# Patient Record
Sex: Female | Born: 1965
Health system: Southern US, Community
[De-identification: ages and names within clinical notes are randomized; demographics above are authoritative.]

## PROBLEM LIST (undated history)

## (undated) DIAGNOSIS — D509 Iron deficiency anemia, unspecified: Secondary | ICD-10-CM

## (undated) DIAGNOSIS — M2550 Pain in unspecified joint: Secondary | ICD-10-CM

## (undated) DIAGNOSIS — F329 Major depressive disorder, single episode, unspecified: Secondary | ICD-10-CM

## (undated) DIAGNOSIS — T7840XA Allergy, unspecified, initial encounter: Secondary | ICD-10-CM

## (undated) DIAGNOSIS — E559 Vitamin D deficiency, unspecified: Secondary | ICD-10-CM

## (undated) DIAGNOSIS — IMO0002 Reserved for concepts with insufficient information to code with codable children: Secondary | ICD-10-CM

## (undated) DIAGNOSIS — R011 Cardiac murmur, unspecified: Secondary | ICD-10-CM

## (undated) DIAGNOSIS — H269 Unspecified cataract: Secondary | ICD-10-CM

## (undated) DIAGNOSIS — G709 Myoneural disorder, unspecified: Secondary | ICD-10-CM

## (undated) DIAGNOSIS — K5900 Constipation, unspecified: Secondary | ICD-10-CM

## (undated) DIAGNOSIS — E079 Disorder of thyroid, unspecified: Secondary | ICD-10-CM

## (undated) DIAGNOSIS — F419 Anxiety disorder, unspecified: Secondary | ICD-10-CM

## (undated) DIAGNOSIS — F32A Depression, unspecified: Secondary | ICD-10-CM

## (undated) DIAGNOSIS — K219 Gastro-esophageal reflux disease without esophagitis: Secondary | ICD-10-CM

## (undated) DIAGNOSIS — E669 Obesity, unspecified: Secondary | ICD-10-CM

## (undated) DIAGNOSIS — D649 Anemia, unspecified: Secondary | ICD-10-CM

## (undated) DIAGNOSIS — K909 Intestinal malabsorption, unspecified: Secondary | ICD-10-CM

## (undated) DIAGNOSIS — E039 Hypothyroidism, unspecified: Secondary | ICD-10-CM

## (undated) DIAGNOSIS — Z86718 Personal history of other venous thrombosis and embolism: Secondary | ICD-10-CM

## (undated) DIAGNOSIS — H409 Unspecified glaucoma: Secondary | ICD-10-CM

## (undated) DIAGNOSIS — M549 Dorsalgia, unspecified: Secondary | ICD-10-CM

## (undated) DIAGNOSIS — M1990 Unspecified osteoarthritis, unspecified site: Secondary | ICD-10-CM

## (undated) DIAGNOSIS — R609 Edema, unspecified: Secondary | ICD-10-CM

## (undated) HISTORY — DX: Edema, unspecified: R60.9

## (undated) HISTORY — DX: Iron deficiency anemia, unspecified: D50.9

## (undated) HISTORY — PX: BACK SURGERY: SHX140

## (undated) HISTORY — DX: Reserved for concepts with insufficient information to code with codable children: IMO0002

## (undated) HISTORY — DX: Cardiac murmur, unspecified: R01.1

## (undated) HISTORY — DX: Vitamin D deficiency, unspecified: E55.9

## (undated) HISTORY — DX: Dorsalgia, unspecified: M54.9

## (undated) HISTORY — DX: Allergy, unspecified, initial encounter: T78.40XA

## (undated) HISTORY — PX: JOINT REPLACEMENT: SHX530

## (undated) HISTORY — DX: Personal history of other venous thrombosis and embolism: Z86.718

## (undated) HISTORY — DX: Anemia, unspecified: D64.9

## (undated) HISTORY — DX: Myoneural disorder, unspecified: G70.9

## (undated) HISTORY — DX: Gastro-esophageal reflux disease without esophagitis: K21.9

## (undated) HISTORY — DX: Unspecified osteoarthritis, unspecified site: M19.90

## (undated) HISTORY — DX: Unspecified glaucoma: H40.9

## (undated) HISTORY — DX: Intestinal malabsorption, unspecified: K90.9

## (undated) HISTORY — PX: SMALL INTESTINE SURGERY: SHX150

## (undated) HISTORY — PX: COLON SURGERY: SHX602

## (undated) HISTORY — DX: Pain in unspecified joint: M25.50

## (undated) HISTORY — DX: Unspecified cataract: H26.9

## (undated) HISTORY — DX: Obesity, unspecified: E66.9

## (undated) HISTORY — DX: Disorder of thyroid, unspecified: E07.9

## (undated) HISTORY — DX: Hypothyroidism, unspecified: E03.9

## (undated) HISTORY — DX: Constipation, unspecified: K59.00

## (undated) HISTORY — PX: SPINE SURGERY: SHX786

---

## 1997-02-10 HISTORY — PX: ABDOMINAL HYSTERECTOMY: SHX81

## 1998-02-10 HISTORY — PX: KNEE SURGERY: SHX244

## 1998-09-05 ENCOUNTER — Ambulatory Visit (HOSPITAL_COMMUNITY): Admission: RE | Admit: 1998-09-05 | Discharge: 1998-09-05 | Payer: Self-pay

## 1999-03-19 ENCOUNTER — Other Ambulatory Visit: Admission: RE | Admit: 1999-03-19 | Discharge: 1999-03-19 | Payer: Self-pay | Admitting: Family Medicine

## 1999-03-22 ENCOUNTER — Encounter: Payer: Self-pay | Admitting: Family Medicine

## 1999-03-22 ENCOUNTER — Ambulatory Visit (HOSPITAL_COMMUNITY): Admission: RE | Admit: 1999-03-22 | Discharge: 1999-03-22 | Payer: Self-pay | Admitting: Family Medicine

## 1999-04-01 ENCOUNTER — Ambulatory Visit (HOSPITAL_COMMUNITY): Admission: RE | Admit: 1999-04-01 | Discharge: 1999-04-01 | Payer: Self-pay | Admitting: Family Medicine

## 1999-04-02 ENCOUNTER — Encounter: Payer: Self-pay | Admitting: Family Medicine

## 2000-05-01 ENCOUNTER — Encounter: Payer: Self-pay | Admitting: Internal Medicine

## 2000-05-01 ENCOUNTER — Ambulatory Visit (HOSPITAL_COMMUNITY): Admission: RE | Admit: 2000-05-01 | Discharge: 2000-05-01 | Payer: Self-pay | Admitting: Internal Medicine

## 2000-12-15 ENCOUNTER — Other Ambulatory Visit: Admission: RE | Admit: 2000-12-15 | Discharge: 2000-12-15 | Payer: Self-pay | Admitting: Family Medicine

## 2004-10-22 ENCOUNTER — Other Ambulatory Visit: Admission: RE | Admit: 2004-10-22 | Discharge: 2004-10-22 | Payer: Self-pay | Admitting: Family Medicine

## 2004-11-06 ENCOUNTER — Ambulatory Visit (HOSPITAL_COMMUNITY): Admission: RE | Admit: 2004-11-06 | Discharge: 2004-11-06 | Payer: Self-pay | Admitting: Obstetrics and Gynecology

## 2005-02-10 HISTORY — PX: GASTRIC BYPASS: SHX52

## 2005-05-21 ENCOUNTER — Encounter: Admission: RE | Admit: 2005-05-21 | Discharge: 2005-05-21 | Payer: Self-pay | Admitting: Surgery

## 2005-05-21 ENCOUNTER — Ambulatory Visit (HOSPITAL_COMMUNITY): Admission: RE | Admit: 2005-05-21 | Discharge: 2005-05-21 | Payer: Self-pay | Admitting: Surgery

## 2005-05-23 ENCOUNTER — Ambulatory Visit (HOSPITAL_COMMUNITY): Admission: RE | Admit: 2005-05-23 | Discharge: 2005-05-23 | Payer: Self-pay | Admitting: Surgery

## 2005-06-08 ENCOUNTER — Ambulatory Visit (HOSPITAL_BASED_OUTPATIENT_CLINIC_OR_DEPARTMENT_OTHER): Admission: RE | Admit: 2005-06-08 | Discharge: 2005-06-08 | Payer: Self-pay | Admitting: Surgery

## 2005-06-15 ENCOUNTER — Ambulatory Visit: Payer: Self-pay | Admitting: Internal Medicine

## 2005-06-17 ENCOUNTER — Encounter: Admission: RE | Admit: 2005-06-17 | Discharge: 2005-06-17 | Payer: Self-pay | Admitting: Surgery

## 2005-08-25 ENCOUNTER — Encounter: Admission: RE | Admit: 2005-08-25 | Discharge: 2005-10-23 | Payer: Self-pay | Admitting: Surgery

## 2005-09-08 ENCOUNTER — Inpatient Hospital Stay (HOSPITAL_COMMUNITY): Admission: RE | Admit: 2005-09-08 | Discharge: 2005-09-10 | Payer: Self-pay | Admitting: Surgery

## 2005-09-09 ENCOUNTER — Encounter: Payer: Self-pay | Admitting: Vascular Surgery

## 2006-03-27 ENCOUNTER — Encounter: Admission: RE | Admit: 2006-03-27 | Discharge: 2006-06-25 | Payer: Self-pay | Admitting: Surgery

## 2006-06-30 ENCOUNTER — Encounter: Admission: RE | Admit: 2006-06-30 | Discharge: 2006-06-30 | Payer: Self-pay | Admitting: Surgery

## 2006-10-13 IMAGING — MG MM DIAGNOSTIC LTD LEFT
2 series · 2 of 2 positions shown · non-contrast
Comparison: none

[REDACTED] LEFT
CC and MLO view(s) were taken of the left breast.

DIGITAL LIMITED LEFT DIAGNOSTIC MAMMOGRAM:
CLINICAL DATA: The patient returns for evaluation of a possible mass in the left breast noted on 
recent screening study of 05-23-05.
A spot compression view of the central portion of the left breast was obtained in the MLO 
projection with no persistent mass or distortion.  A true mediolateral view was obtained and no 
abnormality is noted.

[L MLO]
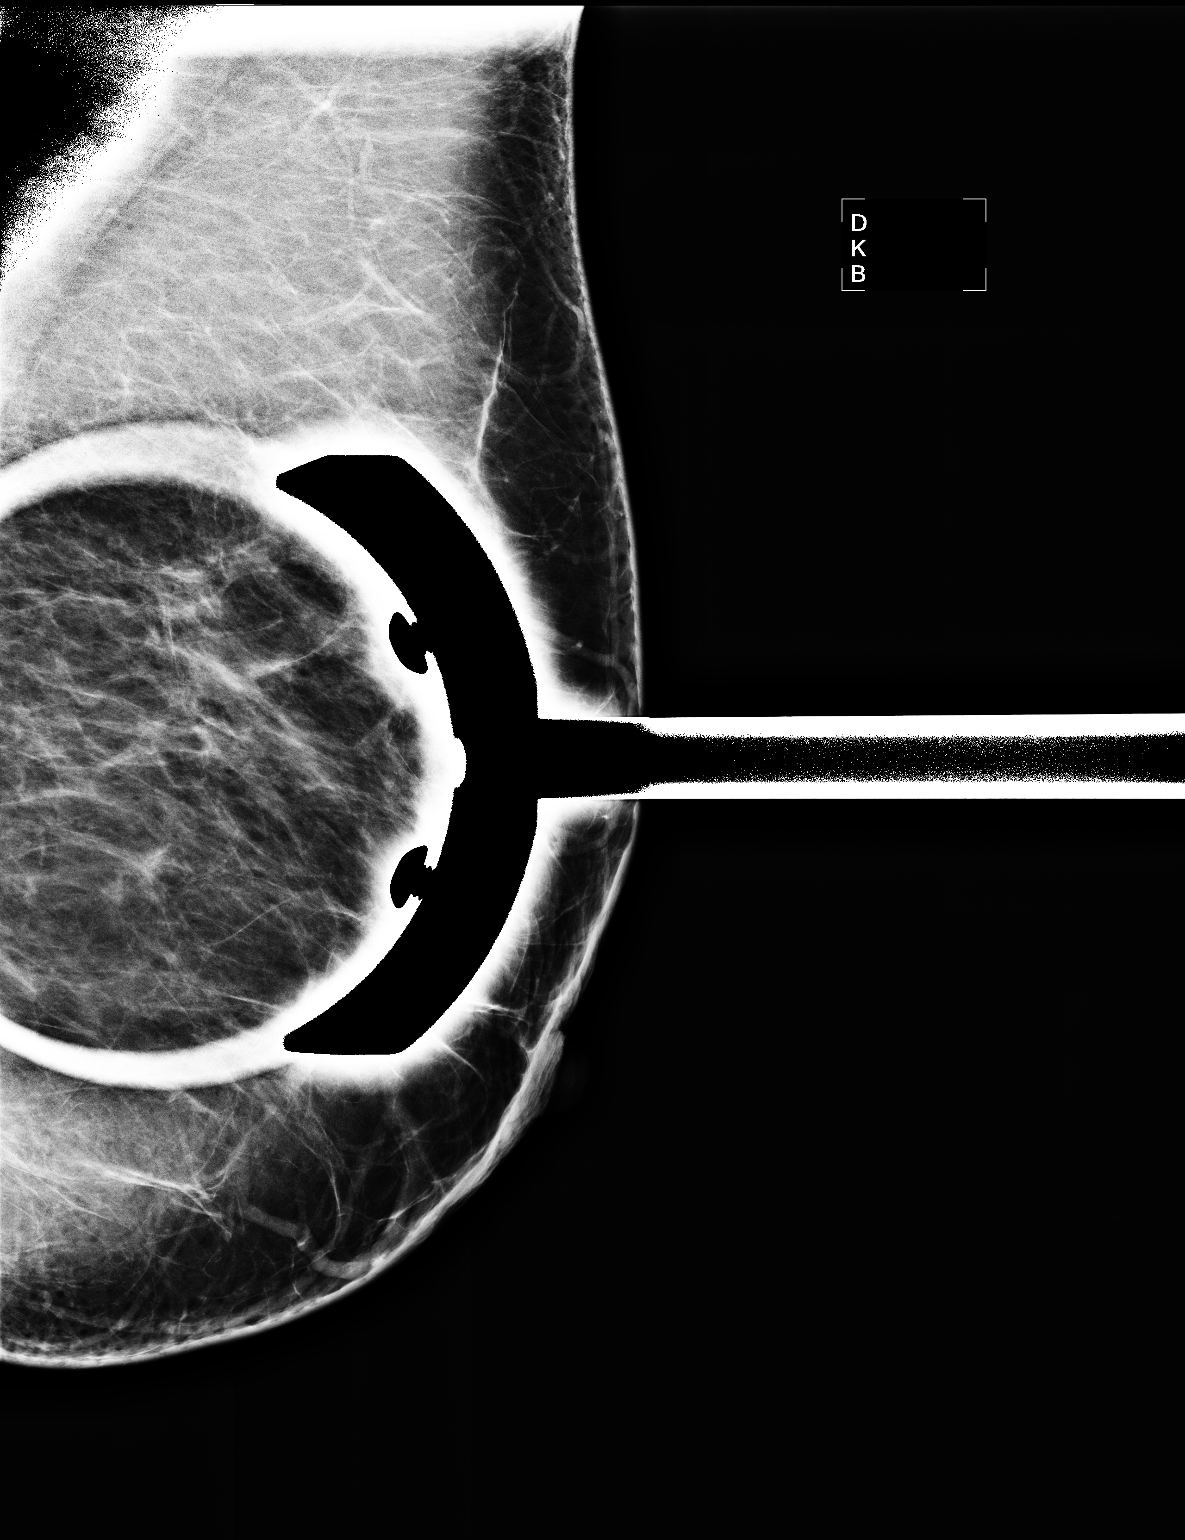

[L ML]
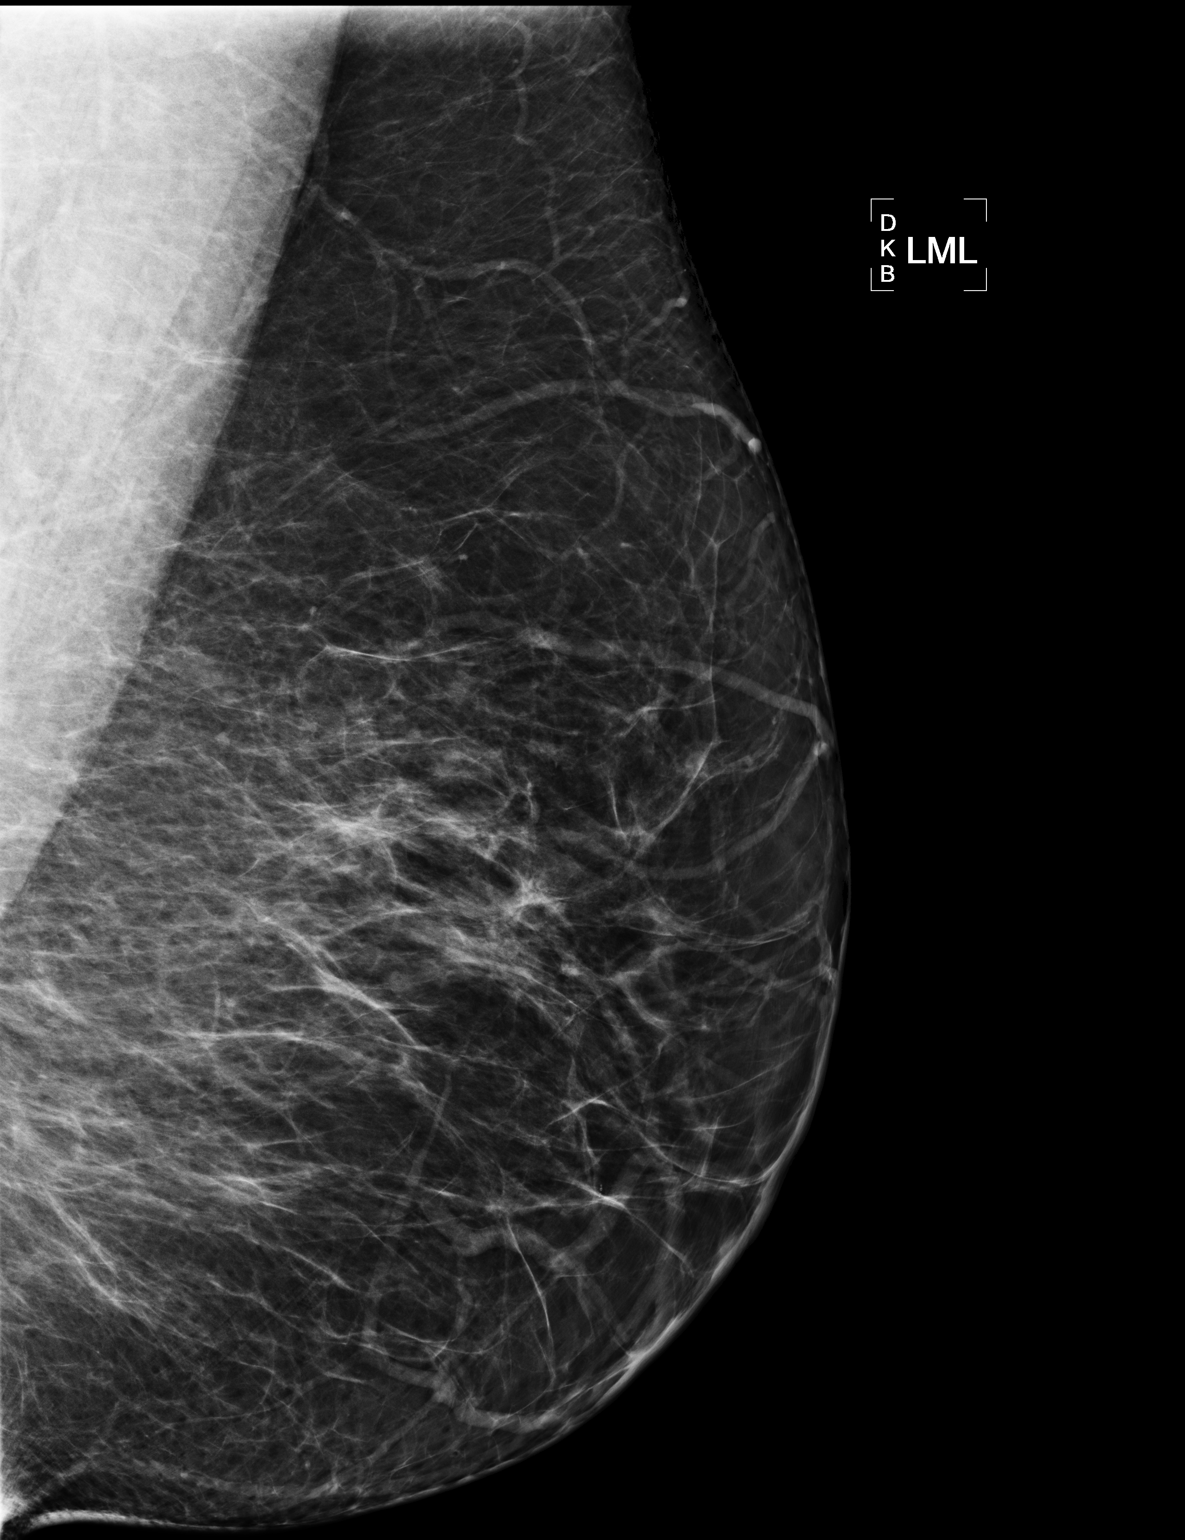

[2 of 2 positions shown; findings below may reference images not displayed]

IMPRESSION: No persistent worrisome abnormality upon additional imaging of the left breast.  Yearly screening 
mammography is suggested.

ASSESSMENT: Negative - BI-RADS 1

Screening mammogram of both breasts in 1 year.
, THIS PROCEDURE WAS A DIGITAL MAMMOGRAM.

## 2006-12-30 ENCOUNTER — Other Ambulatory Visit: Admission: RE | Admit: 2006-12-30 | Discharge: 2006-12-30 | Payer: Self-pay | Admitting: Family Medicine

## 2007-01-14 ENCOUNTER — Ambulatory Visit (HOSPITAL_COMMUNITY): Admission: RE | Admit: 2007-01-14 | Discharge: 2007-01-14 | Payer: Self-pay | Admitting: Family Medicine

## 2008-07-14 ENCOUNTER — Encounter: Admission: RE | Admit: 2008-07-14 | Discharge: 2008-07-14 | Payer: Self-pay | Admitting: Surgery

## 2009-10-18 ENCOUNTER — Encounter
Admission: RE | Admit: 2009-10-18 | Discharge: 2009-12-28 | Payer: Self-pay | Source: Home / Self Care | Admitting: Physical Medicine & Rehabilitation

## 2009-10-23 ENCOUNTER — Ambulatory Visit: Payer: Self-pay | Admitting: Physical Medicine & Rehabilitation

## 2009-11-09 ENCOUNTER — Ambulatory Visit: Payer: Self-pay | Admitting: Physical Medicine & Rehabilitation

## 2009-12-10 ENCOUNTER — Ambulatory Visit: Payer: Self-pay | Admitting: Physical Medicine & Rehabilitation

## 2009-12-28 ENCOUNTER — Ambulatory Visit: Payer: Self-pay | Admitting: Physical Medicine & Rehabilitation

## 2010-02-15 ENCOUNTER — Ambulatory Visit (HOSPITAL_COMMUNITY)
Admission: RE | Admit: 2010-02-15 | Discharge: 2010-02-15 | Payer: Self-pay | Source: Home / Self Care | Attending: Family Medicine | Admitting: Family Medicine

## 2010-02-28 ENCOUNTER — Encounter
Admission: RE | Admit: 2010-02-28 | Discharge: 2010-02-28 | Payer: Self-pay | Source: Home / Self Care | Attending: Family Medicine | Admitting: Family Medicine

## 2010-03-03 ENCOUNTER — Encounter: Payer: Self-pay | Admitting: Surgery

## 2010-06-28 NOTE — Op Note (Signed)
NAMEMarland Guerrero  RIANE, RUNG NO.:  [id number]   MEDICAL RECORD NO.:  [id number]          PATIENT TYPE:  INP   LOCATION:  1508                         FACILITY:  [facility]   PHYSICIAN:  Thornton Park. [name], MD  DATE OF BIRTH:  01-30-1966   DATE OF PROCEDURE:  09/08/2005  DATE OF DISCHARGE:                                 OPERATIVE REPORT   PREOPERATIVE DIAGNOSIS:  Morbid obesity.   POSTOPERATIVE DIAGNOSIS:  Morbid obesity.   PROCEDURE:  Laparoscopic Roux-en-Y gastric bypass.   SURGEON:  Thornton Park. [name], MD.   ASSISTANT:  Alfonse Ras, MD.   ANESTHESIA:  General endotracheal.   DESCRIPTION OF PROCEDURE:  The patient was taken to room one, given general  anesthesia.  The abdomen was prepped with chlorhexidine and draped  sterilely.  Access to the abdomen was gained through the left upper quadrant  using the Optiview technique entering the abdomen without difficulty.  The  abdomen was insufflated and the usual combination of trocars was used  including a 5 mm in the upper midline later for the [place], two 11's in  the right upper quadrant, and an 11 to the left of the umbilicus. I had to  first take down a bunch of adhesions that to the lower previous hysterectomy  incision.  These were taken down with the harmonic scalpel.   Then we identified the ligament Treitz and measured 40 cm distal to the  ligament of Treitz where we divided it with a 4.5 cm Ethicon stapler using a  white load.  The distal end had a Penrose drain which would later be the  Roux limb.  I sutured this Penrose drain on the Roux limb end and then  measured 1 meter down on the Roux limb and then placed the Roux limb and the  biliopancreatic limb side-to-side and created a common defect and stitched  them together and then fired it with a single application of the echelon  stapler white load.  I closed the common defect with a running 2-0 Vicryl  and oversewed that layer with a second layer of  2-0 Vicryl and then covered  that with Tisseel.  The common defect was closed with running 2-0 silk.   The Roux limb lay nicely and was left.  Next we went up and put the  [facility] retractor in and fashioned a small pouch.  I measured down about 4  cm and then came in from the lesser curvature with the first firing of the  echelon stapler.  After firing this using a gold load, the stapler seemed to  hang and I had to manipulate it several times just to get it to disengage.  It seemed to have placed the usual 6 rows of staples nicely and cut.  I  inspected that.  Then I made several more applications to create a small  pouch using Ewald tube. I then sewed the Roux-en-Y limb up to the back wall  with a running 2-0 Vicryl.  I made common defects on the gastric pouch and  the Roux limb side and introduced the Ethicon 45 blue load.  I fired this  and it seemed to fire a nice posterior anastomosis.  This common defect was  closed with running 2-0 Vicryl and was oversewn with a 2-0 Vicryl free  stitch with laparoties.   Dr. Colin Benton went up and endoscoped the patient and I put a clamp on the  outflow tract as was we got a good pressure testing of the pouch and  anastomosis under saline.  No air bubbles were seen, no evidence of leak.  The pouch looked good from the inside.  I went ahead and removed the excess  saline and put Tisseel on the anastomosis.  I also closed the Rentz  defect with a running 2-0 silk suture tacking colon mesentery to the Roux  limb and also up to the gastric remnant.   We inspected everything, it looked good. The abdomen was then deflated.  The  wounds were closed with 4-0 Vicryl and then with staples.  The patient  seemed to tolerate the procedure well.      Thornton Park [name], MD  Electronically Signed     MBM/MEDQ  D:  09/08/2005  T:  09/09/2005  Job:  [id number]

## 2010-06-28 NOTE — Procedures (Signed)
NAME:  Natalie Guerrero, Natalie Guerrero NO.:  [id number]   MEDICAL RECORD NO.:  [id number]          PATIENT TYPE:  OUT   LOCATION:  SLEEP CENTER                 FACILITY:  [facility]   PHYSICIAN:  Clinton D. Maple Hudson, M.D. DATE OF BIRTH:  [birth year]   DATE OF STUDY:  06/08/2005                              NOCTURNAL POLYSOMNOGRAM   REFERRING PHYSICIAN:  Dr. Luretha Murphy.   INDICATIONS FOR STUDY:  Hypersomnia with sleep apnea.   EPWORTH SLEEPINESS SCORE:  09/24.   BMI:  1.  Weight 320 pounds.   HOME MEDICATIONS:  Xanax, Levothyroxine.   SLEEP ARCHITECTURE:  Total sleep time 373 minutes with sleep efficiency 90%.  Stage I was 6%, stage II 67%, stages III and IV 7%, REM 20% of total sleep  time.  Sleep latency 3 minutes, REM latency 89 minutes, awake after sleep  onset 40 minutes, arousal index 13.7.  No bedtime medication taken.   RESPIRATORY DATA:  Apnea/hypopnea index (AHI, RDI) 10 obstructive events per  hour indicating mild obstructive sleep apnea/hypopnea syndrome.  This  included 15 obstructive apneas and 47 hypopneas.  Events were more common  while supine but also noted while sleeping on her left side.  REM AHI of  17.1 per hour.  She had insignificant early events to permit use of CPAP  titration by split protocol on the study night.   OXYGEN DATA:  Mild to moderate snoring with oxygen desaturation to a nadir  of 83%.  Mean oxygen saturation through the study was 94% on room air.   CARDIAC DATA:  Normal sinus rhythm.   MOVEMENT/PARASOMNIAS:  Occasional limb jerk with arousal, insignificant.  Bathroom x2.   IMPRESSION/RECOMMENDATIONS:  1.  Mild obstructive sleep apnea/hypopnea syndrome, AHI 10 per hour with      events somewhat more common while supine.  Mild to moderate snoring with      oxygen desaturation to a nadir of 83%.  2.  Scores in this range are sometimes treated with CPAP if more      conservative therapies including weight loss, sleeping off flat of  back,      and treatment for any significant nasal airway obstruction prove      insufficient.  CPAP      therapy may be considered if indicated by observation in the immediate      postoperative period, but may not be required.  CPAP titration can be      performed if clinically appropriate.      Clinton D. Maple Hudson, M.D.  Diplomate, Biomedical engineer of Sleep Medicine  Electronically Signed     CDY/MEDQ  D:  06/15/2005 10:36:16  T:  06/16/2005 11:58:47  Job:  [id number]

## 2010-06-28 NOTE — Op Note (Signed)
NAMEMarland Kitchen  Natalie Guerrero, Natalie Guerrero NO.:  [id number]   MEDICAL RECORD NO.:  [id number]          PATIENT TYPE:  INP   LOCATION:  1508                         FACILITY:  [facility]   PHYSICIAN:  Alfonse Ras, MD   DATE OF BIRTH:  September 12, 1965   DATE OF PROCEDURE:  09/08/2005  DATE OF DISCHARGE:                                 OPERATIVE REPORT   PREOPERATIVE DIAGNOSIS:  Status post laparoscopic Roux-en-Y gastric bypass.   POSTOPERATIVE DIAGNOSIS:  Status post laparoscopic Roux-en-Y gastric bypass,  no evidence of leak and patent anastomosis   PROCEDURE:  Upper endoscopy.   SURGEON:  Dr. Baruch Merl.   DESCRIPTION:  At the completion of laparoscopic Roux-en-Y gastric bypass,  the Olympus endoscope was passed transorally, down through the esophagus  under direct vision.  The gastric pouch was entered and was insufflated.  There was no evidence of leak and the anastomosis was patent.  It was then  measured at 5 cm in length, and the scope was removed.  The remainder of the  operative report will be dictated by Dr. [name].      Alfonse Ras, MD  Electronically Signed     KRE/MEDQ  D:  09/08/2005  T:  09/09/2005  Job:  [id number]   cc:   Thornton Park [name], MD  [address]  [place]  [place] [address]

## 2010-07-14 ENCOUNTER — Other Ambulatory Visit: Payer: Self-pay | Admitting: Orthopedic Surgery

## 2010-07-14 DIAGNOSIS — M549 Dorsalgia, unspecified: Secondary | ICD-10-CM

## 2010-07-14 DIAGNOSIS — IMO0002 Reserved for concepts with insufficient information to code with codable children: Secondary | ICD-10-CM

## 2010-07-17 ENCOUNTER — Other Ambulatory Visit: Payer: Self-pay | Admitting: Orthopedic Surgery

## 2010-07-17 ENCOUNTER — Ambulatory Visit
Admission: RE | Admit: 2010-07-17 | Discharge: 2010-07-17 | Disposition: A | Discharge status: Home / Self Care | Payer: Self-pay | Source: Ambulatory Visit | Attending: Orthopedic Surgery | Admitting: Orthopedic Surgery

## 2010-07-17 DIAGNOSIS — M549 Dorsalgia, unspecified: Secondary | ICD-10-CM

## 2010-07-17 DIAGNOSIS — IMO0002 Reserved for concepts with insufficient information to code with codable children: Secondary | ICD-10-CM

## 2010-07-31 ENCOUNTER — Other Ambulatory Visit: Payer: Self-pay | Admitting: Family Medicine

## 2010-07-31 ENCOUNTER — Other Ambulatory Visit (HOSPITAL_COMMUNITY)
Admission: RE | Admit: 2010-07-31 | Discharge: 2010-07-31 | Disposition: A | Discharge status: Home / Self Care | Payer: 59 | Source: Ambulatory Visit | Attending: Family Medicine | Admitting: Family Medicine

## 2010-07-31 DIAGNOSIS — Z1159 Encounter for screening for other viral diseases: Secondary | ICD-10-CM | POA: Insufficient documentation

## 2010-07-31 DIAGNOSIS — Z01419 Encounter for gynecological examination (general) (routine) without abnormal findings: Secondary | ICD-10-CM | POA: Insufficient documentation

## 2010-09-11 ENCOUNTER — Encounter (HOSPITAL_COMMUNITY)
Admission: RE | Admit: 2010-09-11 | Discharge: 2010-09-11 | Disposition: A | Discharge status: Home / Self Care | Payer: Worker's Compensation | Source: Ambulatory Visit | Attending: Orthopedic Surgery | Admitting: Orthopedic Surgery

## 2010-09-11 ENCOUNTER — Other Ambulatory Visit (HOSPITAL_COMMUNITY): Payer: Self-pay | Admitting: Orthopedic Surgery

## 2010-09-11 ENCOUNTER — Ambulatory Visit (HOSPITAL_COMMUNITY)
Admission: RE | Admit: 2010-09-11 | Discharge: 2010-09-11 | Disposition: A | Discharge status: Home / Self Care | Payer: Worker's Compensation | Source: Ambulatory Visit | Attending: Orthopedic Surgery | Admitting: Orthopedic Surgery

## 2010-09-11 DIAGNOSIS — M545 Low back pain, unspecified: Secondary | ICD-10-CM

## 2010-09-11 DIAGNOSIS — Z01818 Encounter for other preprocedural examination: Secondary | ICD-10-CM | POA: Insufficient documentation

## 2010-09-11 DIAGNOSIS — Z01812 Encounter for preprocedural laboratory examination: Secondary | ICD-10-CM | POA: Insufficient documentation

## 2010-09-11 LAB — URINALYSIS, ROUTINE W REFLEX MICROSCOPIC
Bilirubin Urine: NEGATIVE
Glucose, UA: NEGATIVE mg/dL
Nitrite: NEGATIVE
Specific Gravity, Urine: 1.016 (ref 1.005–1.030)

## 2010-09-11 LAB — DIFFERENTIAL
Basophils Absolute: 0 10*3/uL (ref 0.0–0.1)
Basophils Relative: 0 % (ref 0–1)
Eosinophils Absolute: 0.2 10*3/uL (ref 0.0–0.7)
Eosinophils Relative: 4 % (ref 0–5)
Neutro Abs: 2 10*3/uL (ref 1.7–7.7)

## 2010-09-11 LAB — COMPREHENSIVE METABOLIC PANEL
ALT: 17 U/L (ref 0–35)
Alkaline Phosphatase: 60 U/L (ref 39–117)
BUN: 6 mg/dL (ref 6–23)
CO2: 28 mEq/L (ref 19–32)
GFR calc Af Amer: >60 mL/min (ref >60)
GFR calc non Af Amer: >60 mL/min (ref >60)
Glucose, Bld: 88 mg/dL (ref 70–99)
Potassium: 4.5 mEq/L (ref 3.5–5.1)
Total Bilirubin: 0.5 mg/dL (ref 0.3–1.2)
Total Protein: 7 g/dL (ref 6.0–8.3)

## 2010-09-11 LAB — URINE MICROSCOPIC-ADD ON

## 2010-09-11 LAB — ABO/RH: ABO/RH(D): A POS

## 2010-09-11 LAB — CBC
MCV: 93.4 fL (ref 78.0–100.0)
Platelets: 314 10*3/uL (ref 150–400)

## 2010-09-11 LAB — TYPE AND SCREEN: Antibody Screen: NEGATIVE

## 2010-09-11 LAB — APTT: aPTT: 29 seconds (ref 24–37)

## 2010-09-11 LAB — PROTIME-INR: Prothrombin Time: 13.9 seconds (ref 11.6–15.2)

## 2010-09-13 ENCOUNTER — Encounter: Payer: Self-pay | Admitting: Vascular Surgery

## 2010-09-17 ENCOUNTER — Encounter: Payer: Self-pay | Admitting: Vascular Surgery

## 2010-09-17 ENCOUNTER — Encounter (INDEPENDENT_AMBULATORY_CARE_PROVIDER_SITE_OTHER): Payer: Self-pay | Admitting: Vascular Surgery

## 2010-09-17 DIAGNOSIS — IMO0002 Reserved for concepts with insufficient information to code with codable children: Secondary | ICD-10-CM

## 2010-09-17 NOTE — Progress Notes (Signed)
Subjective:     Patient ID: Natalie Guerrero, female   DOB: 10/01/1965, 45 y.o.   MRN: [id number]  HPI The patient presents today for evaluation of ALIF. She has been seen by Dr. Yevette Edwards and he is recommended anterior approach for L5-S1 spine surgery. Past Medical History  Diagnosis Date  . Thyroid disease hypothroidism  . DDD (degenerative disc disease)     History  Substance Use Topics  . Smoking status: Never Smoker   . Smokeless tobacco: Never Used  . Alcohol Use: Yes    No family history on file.  Allergies  Allergen Reactions  . Codeine     Current outpatient prescriptions:Hydrocodone-Acetaminophen (NORCO PO), Take by mouth.  , Disp: , Rfl: ;  Levothyroxine Sodium (SYNTHROID PO), Take by mouth.  , Disp: , Rfl: ;  Methocarbamol (ROBAXIN PO), Take by mouth.  , Disp: , Rfl: ;  TRAMADOL HCL PO, Take by mouth.  , Disp: , Rfl:   Filed Vitals:   09/17/10 0915  Height: 5\' 6"  (1.676 m)  Weight: 215 lb (97.523 kg)    Body mass index is 34.70 kg/(m^2).       Review of Systems Negative except history of present illness. Prior gastric bypass.    Objective:   Physical Exam A well-developed well-nourished black female no acute distress. HEENT normal.. 2+ radial and posterior tibial pulses bilaterally.  Abdomen soft nontender no masses. Prior transverse low midline incision.    Assessment:    degenerative disc disease. Plan for L5 S1-ALIF     Plan:     L5 S1-ALIF

## 2010-09-18 ENCOUNTER — Encounter: Payer: 59 | Admitting: Vascular Surgery

## 2010-09-19 ENCOUNTER — Inpatient Hospital Stay (HOSPITAL_COMMUNITY)
Admission: RE | Admit: 2010-09-19 | Discharge: 2010-09-21 | DRG: 460 | Disposition: A | Discharge status: Home / Self Care | Payer: Worker's Compensation | Source: Ambulatory Visit | Attending: Orthopedic Surgery | Admitting: Orthopedic Surgery

## 2010-09-19 ENCOUNTER — Inpatient Hospital Stay (HOSPITAL_COMMUNITY): Payer: Worker's Compensation

## 2010-09-19 DIAGNOSIS — M5137 Other intervertebral disc degeneration, lumbosacral region: Secondary | ICD-10-CM

## 2010-09-19 DIAGNOSIS — M51379 Other intervertebral disc degeneration, lumbosacral region without mention of lumbar back pain or lower extremity pain: Principal | ICD-10-CM | POA: Diagnosis present

## 2010-09-24 NOTE — Op Note (Signed)
  NAMEMarland Kitchen  Natalie Guerrero, Natalie Guerrero NO.:  [id number]  MEDICAL RECORD NO.:  [id number]  LOCATION:  5034                         FACILITY:  [facility]  PHYSICIAN:  Larina Earthly, M.D.    DATE OF BIRTH:  [birth year]  DATE OF PROCEDURE:  09/19/2010 DATE OF DISCHARGE:                              OPERATIVE REPORT   PREOPERATIVE DIAGNOSIS:  L5-S1 degenerative disk disease.  POSTOPERATIVE DIAGNOSIS:  L5-S1 degenerative disk disease.  PROCEDURE:  Anterior lumbar exposure for anterior lumbar interbody fusion.  SURGEON:  Larina Earthly, MD  CO-SURGEON FOR EXPOSURE:  Estill Bamberg, MD  ANESTHESIA:  General endotracheal.  COMPLICATIONS:  None.  DISPOSITION:  Dictated a separate note by Dr. Yevette Edwards.  DESCRIPTION OF PROCEDURE:  The patient was taken to the operating room, placed in supine position where the abdomen was imaged with the lateral C-arm showing the level of L5-S1 disk.  She did have a lower Pfannenstiel incision.  This was an appropriate place for exposure.  The patient was prepped and draped in usual sterile fashion.  Incision was made over the prior Pfannenstiel incision from the midline laterally on the left, carried down through subcutaneous fat with electrocautery. The fascia was mobilized off the anterior rectus sheath.  The anterior rectus sheath was opened in line with the skin incision.  There was scarring from the prior incision and the rectus muscle was mobilized. The retroperitoneum was entered bluntly lateral and the intraperitoneal contents were mobilized to the right and the peritoneum was not entered. Blunt dissection was used to continue the dissection and the posterior rectus sheath was divided laterally.  The iliac vessels were identified and middle sacral vessels were divided with Ligaclips.  Blunt dissection over the L5-S1 disk was used to give exposure to the right and left side of the disk.  The brow retractor was brought onto the field and  was positioned to the right and left of the L5-S1 disk.  The malleable retractor was used superiorly for exposure.  C-arm again confirmed, this was indeed L5-S1 disk.  The remainder of the procedure will be dictated separately by Dr. Estill Bamberg.    Larina Earthly, M.D.    TFE/MEDQ  D:  09/19/2010  T:  09/19/2010  Job:  [id number]  Electronically Signed by Candelario Steppe M.D. on 09/24/2010 10:34:45 AM

## 2010-10-02 NOTE — Op Note (Signed)
NAMEMarland Kitchen  Natalie Guerrero, Natalie Guerrero NO.:  [id number]  MEDICAL RECORD NO.:  [id number]  LOCATION:  5034                         FACILITY:  [facility]  PHYSICIAN:  Estill Bamberg, MD      DATE OF BIRTH:  [birth year]  DATE OF PROCEDURE:  09/19/2010 DATE OF DISCHARGE:                              OPERATIVE REPORT   PREOPERATIVE DIAGNOSIS:  L5-S1 degenerative disk disease.  POSTOPERATIVE DIAGNOSIS:  L5-S1 degenerative disk disease.  PROCEDURES: 1. L5-S1 anterior lumbar interbody fusion. 2. Insertion of Cougar interbody cage (medium, 14 mm, 10 degrees). 3. Bone marrow aspiration. 4. Posterior spinal fusion L5-S1. 5. Placement of posterior instrumentation L5, S1. 6. Intraoperative use of fluoroscopy.  First assist for anterior lumbar exposure performed by Dr. Tawanna Cooler Early that was surgery.  ANESTHESIA:  General endotracheal anesthesia.  SURGEON:  Estill Bamberg, MD  ASSISTANT:  Janace Litten for the posterior instrumentation and fusion aspect of the procedure.  COMPLICATIONS:  None.  DISPOSITION:  Stable.  ESTIMATED BLOOD LOSS:  200 mL.  INDICATIONS FOR PROCEDURE:  Briefly, Natalie Guerrero is a very pleasant 45- year-old female who initially presented to me on June 07, 2010, with a longstanding history of pain in her low back status post a work-related injury that occurred on November 14, 2008.  The patient had extensive conservative care.  An MRI and radiographs did reveal L5-S1 degenerative disk disease.  Given her failure of ongoing conservative care, we did go forward with a diskogram which was notable for concordant pain at the L5- S1 level.  Given the patient's ongoing pain for approximately 1-1/2 years with an MRI and radiographs clearly notable for degenerative disk disease with a positive diskogram, we did have a discussion regarding going forward with an anterior/posterior L5-S1 fusion.  The patient fully understood the risks and limitations of the procedure as  outlined in my preoperative note.  OPERATIVE DETAILS:  On September 19, 2010, the patient was brought to surgery and general endotracheal anesthesia was administered.  The patient was placed supine on a well-padded hospital bed.  The abdomen was prepped and draped in usual sterile fashion.  SCDs were placed and antibiotics were given.  A time-out procedure was performed.  A transverse incision was then made overlying the patient's scar from her previous hysterectomy.  An anterior exposure was then provided by Dr. Gretta Began and will be dictated in a separate operative note. Ultimately, the L5-S1 interspace was identified and I did go forward with a very thorough L5-S1 diskectomy using a knife and a series of curettes and pituitary rongeurs.  I did perform the diskectomy back to the posterior longitudinal ligament.  I did note excellent punctate bleeding from the L5 and S1 endplates.  Of note, there was no violation of the endplates to cancellus bone.  I then prepared the endplates using an angled awl and a rasp.  I then went forward placing a series of interbody trials and I did feel that a 14-mm medium cage with 10 degrees of lordosis would be the most appropriate fit.  I then harvested 7 mL of bone marrow aspirate from the L5 vertebral body.  This was mixed with 10 mL of Vitoss BA.  Approximately 50%  of the Vitoss BA/bone marrow aspirate mixture was packed into the interbody device.  The interbody device was then advanced into the L5-S1 interspace in the usual fashion using gentle distraction.  I was extremely pleased with the final press fit.  AP and lateral fluoroscopy was used to help optimize the location of the graft.  Again, I was very pleased with the final appearance.  The wound was then copiously irrigated.  The fascia was closed using #1 Prolene.  The subcutaneous layer was closed using 2-0 Vicryl and the skin was closed using 3-0 Monocryl.  The patient was then rolled prone in  a rotisserie fashion.  All bony prominences were meticulously padded and the back was prepped and draped in the usual sterile fashion.  I then marked out the lateral border of the pedicles of L5 and the pedicles of S1.  I then made a 3-1/2-cm incision approximately 3 cm lateral to the lateral border of the pedicles on both the right and the left sides.  I then used Jamshidi needles to cannulate the L5 and S1 pedicles, first on the right and then on the left.  I used a 6-mm tap and tap was tested using triggered EMG.  There was no tap that tested below 21 mA, which did confirm that there was no cortical violation of the tap and that there was no contact with any of the traversing or exiting nerves.  I then went forward with placing 7 x 45 mm screws at L5 and 7 x 40 mm screws of S1.  This again was done under live AP and lateral fluoroscopy to confirm appropriate positioning of the screws.  I then passed a 35-mm rod subfascially in the usual fashion.  The rod was secured into place using end caps and a final tightening procedure was performed.  I then turned my attention towards the patient's right posterolateral gutter.  The posterolateral gutter on the right side was prepared by decorticating the transverse process of L5 and the sacral ala of S1.  I then placed the remainder of the Vitoss BA into the posterolateral gutter on the right side in order to achieve a posterolateral fusion and optimize the patient's fusion success.  I was very happy with the final AP and lateral appearance.  At this point, the wound was copiously irrigated.  The fascia was closed using #1 Vicryl. The subcutaneous layer was closed using 2-0 Vicryl and the skin was closed using 3-0 Monocryl.  The patient was then awoken from general endotracheal anesthesia and transferred to recovery in stable condition. All instrument counts were correct at the termination of the procedure.  Of note, Janace Litten was my  assistant throughout the posterior aspect of the procedure and aided in essential retraction suctioning required throughout the surgery.     Estill Bamberg, MD     MD/MEDQ  D:  09/19/2010  T:  09/19/2010  Job:  [id number]  cc:   Renaye Rakers, M.D.  Electronically Signed by Estill Bamberg  on 10/02/2010 12:25:00 PM

## 2010-10-15 NOTE — Discharge Summary (Signed)
  NAMEMarland Kitchen  [name], [name] NO.:  [id number]  MEDICAL RECORD NO.:  [id number]  LOCATION:  5034                         FACILITY:  [facility]  PHYSICIAN:  Estill Bamberg, MD      DATE OF BIRTH:  05/05/1965  DATE OF ADMISSION:  09/19/2010 DATE OF DISCHARGE:  09/21/2010                              DISCHARGE SUMMARY   ADMISSION DIAGNOSIS:  L5-S1 degenerative disk disease.  DISCHARGE DIAGNOSIS:  L5-S1 degenerative disk disease.  ADMISSION HISTORY:  Briefly, Natalie Guerrero is a very pleasant patient who has been followed in my office for severe debilitating pain in her low back.  She did fail extensive conservative care and was therefore admitted on September 19, 2010, for an anterior/posterior fusion given her ongoing pain and lack of response to conservative care.  HOSPITAL COURSE:  On September 19, 2010, the patient was brought to surgery and underwent an L5-S1 anterior/posterior fusion.  The patient tolerated the procedure well and was transferred to recovery in stable condition. The patient was progressively mobilized throughout her hospital stay. On postop day #2, it was felt the patient will be safe for discharge home and the patient was ultimately discharged on September 21, 2010.  DISCHARGE INSTRUCTIONS:  The patient will take Percocet for pain and Valium for spasms.  She will adhere to back precautions at all times. She will follow back up in my office in approximately 2 weeks after her procedure.     Estill Bamberg, MD     MD/MEDQ  D:  10/02/2010  T:  10/02/2010  Job:  [id number]  Electronically Signed by Estill Bamberg  on 10/15/2010 06:45:35 PM

## 2011-07-03 ENCOUNTER — Other Ambulatory Visit: Payer: Self-pay | Admitting: Family Medicine

## 2011-07-03 DIAGNOSIS — Z1231 Encounter for screening mammogram for malignant neoplasm of breast: Secondary | ICD-10-CM

## 2011-07-11 ENCOUNTER — Ambulatory Visit
Admission: RE | Admit: 2011-07-11 | Discharge: 2011-07-11 | Disposition: A | Discharge status: Home / Self Care | Payer: 59 | Source: Ambulatory Visit | Attending: Family Medicine | Admitting: Family Medicine

## 2011-07-11 DIAGNOSIS — Z1231 Encounter for screening mammogram for malignant neoplasm of breast: Secondary | ICD-10-CM

## 2011-07-30 ENCOUNTER — Other Ambulatory Visit (HOSPITAL_COMMUNITY)
Admission: RE | Admit: 2011-07-30 | Discharge: 2011-07-30 | Disposition: A | Discharge status: Home / Self Care | Payer: 59 | Source: Ambulatory Visit | Attending: Family Medicine | Admitting: Family Medicine

## 2011-07-30 DIAGNOSIS — Z1159 Encounter for screening for other viral diseases: Secondary | ICD-10-CM | POA: Insufficient documentation

## 2011-07-30 DIAGNOSIS — Z01419 Encounter for gynecological examination (general) (routine) without abnormal findings: Secondary | ICD-10-CM | POA: Insufficient documentation

## 2011-10-12 ENCOUNTER — Encounter (HOSPITAL_COMMUNITY): Payer: Self-pay

## 2011-10-12 ENCOUNTER — Emergency Department (HOSPITAL_COMMUNITY)
Admission: EM | Admit: 2011-10-12 | Discharge: 2011-10-12 | Disposition: A | Discharge status: Home / Self Care | Payer: No Typology Code available for payment source | Attending: Emergency Medicine | Admitting: Emergency Medicine

## 2011-10-12 ENCOUNTER — Emergency Department (HOSPITAL_COMMUNITY): Payer: No Typology Code available for payment source

## 2011-10-12 DIAGNOSIS — Z79899 Other long term (current) drug therapy: Secondary | ICD-10-CM | POA: Insufficient documentation

## 2011-10-12 DIAGNOSIS — E079 Disorder of thyroid, unspecified: Secondary | ICD-10-CM | POA: Insufficient documentation

## 2011-10-12 DIAGNOSIS — M549 Dorsalgia, unspecified: Secondary | ICD-10-CM | POA: Insufficient documentation

## 2011-10-12 DIAGNOSIS — R109 Unspecified abdominal pain: Secondary | ICD-10-CM | POA: Insufficient documentation

## 2011-10-12 DIAGNOSIS — Y92410 Unspecified street and highway as the place of occurrence of the external cause: Secondary | ICD-10-CM | POA: Insufficient documentation

## 2011-10-12 DIAGNOSIS — IMO0002 Reserved for concepts with insufficient information to code with codable children: Secondary | ICD-10-CM | POA: Insufficient documentation

## 2011-10-12 MED ORDER — ONDANSETRON HCL 4 MG/2ML IJ SOLN: 4.0000 mg | INTRAMUSCULAR | Status: DC | PRN

## 2011-10-12 MED ORDER — PERCOCET 5-325 MG PO TABS: 1.0000 | ORAL_TABLET | Freq: Four times a day (QID) | ORAL | Status: AC | PRN

## 2011-10-12 MED ORDER — NAPROXEN 375 MG PO TABS: 375.0000 mg | ORAL_TABLET | Freq: Two times a day (BID) | ORAL | Status: AC

## 2011-10-12 MED ORDER — OXYCODONE-ACETAMINOPHEN 5-325 MG PO TABS
1.0000 | ORAL_TABLET | Freq: Once | ORAL | Status: AC
  Administered 2011-10-12: 1 via ORAL
  Filled 2011-10-12: qty 1

## 2011-10-12 MED ORDER — MORPHINE SULFATE 4 MG/ML IJ SOLN: 6.0000 mg | Freq: Once | INTRAMUSCULAR | Status: DC

## 2011-10-12 MED ORDER — IBUPROFEN 800 MG PO TABS
800.0000 mg | ORAL_TABLET | Freq: Once | ORAL | Status: AC
  Administered 2011-10-12: 800 mg via ORAL
  Filled 2011-10-12: qty 1
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
## 2011-10-12 NOTE — ED Notes (Signed)
Provider in room, with two Nurse Techs, patient removed from backboard, provider removed c-collar.  Pt complains of some abdominal pain, her lower back is what she reports as the most pain.

## 2011-10-13 NOTE — ED Provider Notes (Signed)
Medical screening examination/treatment/procedure(s) were conducted as a shared visit with non-physician practitioner(s) and myself.  I personally evaluated the patient during the encounter   Joya Gaskins, MD 10/13/11 2352

## 2012-07-28 ENCOUNTER — Other Ambulatory Visit: Payer: Self-pay

## 2012-07-28 DIAGNOSIS — Z1231 Encounter for screening mammogram for malignant neoplasm of breast: Secondary | ICD-10-CM

## 2012-07-30 ENCOUNTER — Ambulatory Visit
Admission: RE | Admit: 2012-07-30 | Discharge: 2012-07-30 | Disposition: A | Discharge status: Home / Self Care | Payer: 59 | Source: Ambulatory Visit

## 2012-07-30 ENCOUNTER — Other Ambulatory Visit (HOSPITAL_COMMUNITY)
Admission: RE | Admit: 2012-07-30 | Discharge: 2012-07-30 | Disposition: A | Discharge status: Home / Self Care | Payer: 59 | Source: Ambulatory Visit | Attending: Family Medicine | Admitting: Family Medicine

## 2012-07-30 ENCOUNTER — Other Ambulatory Visit: Payer: Self-pay | Admitting: Family Medicine

## 2012-07-30 DIAGNOSIS — Z01419 Encounter for gynecological examination (general) (routine) without abnormal findings: Secondary | ICD-10-CM | POA: Insufficient documentation

## 2012-07-30 DIAGNOSIS — Z1151 Encounter for screening for human papillomavirus (HPV): Secondary | ICD-10-CM | POA: Insufficient documentation

## 2012-07-30 DIAGNOSIS — Z1231 Encounter for screening mammogram for malignant neoplasm of breast: Secondary | ICD-10-CM

## 2012-10-19 ENCOUNTER — Telehealth: Payer: Self-pay | Admitting: Internal Medicine

## 2012-10-19 NOTE — Telephone Encounter (Signed)
C/D 10/19/12 for appt. 11/01/12

## 2012-10-27 ENCOUNTER — Telehealth: Payer: Self-pay | Admitting: Hematology and Oncology

## 2012-10-27 NOTE — Telephone Encounter (Signed)
S/W PT IN REF TO NP APPT NOW ON 11/10/12@9 :30

## 2012-10-27 NOTE — Telephone Encounter (Signed)
C/D 10/27/12 for appt. 11/10/12

## 2012-11-01 ENCOUNTER — Ambulatory Visit: Payer: Self-pay

## 2012-11-10 ENCOUNTER — Ambulatory Visit: Payer: 59

## 2012-11-10 ENCOUNTER — Encounter: Payer: Self-pay | Admitting: Hematology and Oncology

## 2012-11-10 ENCOUNTER — Encounter: Payer: Self-pay | Admitting: Internal Medicine

## 2012-11-10 ENCOUNTER — Telehealth: Payer: Self-pay | Admitting: Hematology and Oncology

## 2012-11-10 ENCOUNTER — Ambulatory Visit (HOSPITAL_BASED_OUTPATIENT_CLINIC_OR_DEPARTMENT_OTHER): Payer: 59 | Admitting: Lab

## 2012-11-10 ENCOUNTER — Ambulatory Visit (HOSPITAL_BASED_OUTPATIENT_CLINIC_OR_DEPARTMENT_OTHER): Payer: 59 | Admitting: Hematology and Oncology

## 2012-11-10 ENCOUNTER — Telehealth: Payer: Self-pay | Admitting: *Deleted

## 2012-11-10 VITALS — BP 135/69 | HR 66 | Temp 98.1°F | Resp 18 | Ht 66.0 in | Wt 229.6 lb

## 2012-11-10 DIAGNOSIS — R5381 Other malaise: Secondary | ICD-10-CM

## 2012-11-10 DIAGNOSIS — D509 Iron deficiency anemia, unspecified: Secondary | ICD-10-CM | POA: Insufficient documentation

## 2012-11-10 DIAGNOSIS — M129 Arthropathy, unspecified: Secondary | ICD-10-CM

## 2012-11-10 DIAGNOSIS — Z9884 Bariatric surgery status: Secondary | ICD-10-CM

## 2012-11-10 HISTORY — DX: Iron deficiency anemia, unspecified: D50.9

## 2012-11-10 LAB — CBC WITH DIFFERENTIAL/PLATELET
Basophils Absolute: 0 10*3/uL (ref 0.0–0.1)
EOS%: 5.6 % (ref 0.0–7.0)
HCT: 30.8 % — ABNORMAL LOW (ref 34.8–46.6)
HGB: 10.2 g/dL — ABNORMAL LOW (ref 11.6–15.9)
LYMPH%: 40.2 % (ref 14.0–49.7)
MCH: 31.1 pg (ref 25.1–34.0)
MCV: 93.9 fL (ref 79.5–101.0)
MONO%: 6 % (ref 0.0–14.0)
NEUT%: 47.4 % (ref 38.4–76.8)
Platelets: 334 10*3/uL (ref 145–400)
RDW: 16 % — ABNORMAL HIGH (ref 11.2–14.5)
WBC: 4.3 10*3/uL (ref 3.9–10.3)
lymph#: 1.7 10*3/uL (ref 0.9–3.3)

## 2012-11-10 LAB — COMPREHENSIVE METABOLIC PANEL (CC13)
AST: 34 U/L (ref 5–34)
Alkaline Phosphatase: 66 U/L (ref 40–150)
BUN: 7.5 mg/dL (ref 7.0–26.0)
CO2: 28 mEq/L (ref 22–29)
Creatinine: 0.8 mg/dL (ref 0.6–1.1)
Glucose: 86 mg/dl (ref 70–140)
Sodium: 141 mEq/L (ref 136–145)
Total Bilirubin: 0.57 mg/dL (ref 0.20–1.20)
Total Protein: 7.4 g/dL (ref 6.4–8.3)

## 2012-11-10 LAB — LACTATE DEHYDROGENASE (CC13): LDH: 228 U/L (ref 125–245)

## 2012-11-10 NOTE — Telephone Encounter (Signed)
Per staff message and POF I have scheduled appts.  JMW  

## 2012-11-10 NOTE — Telephone Encounter (Signed)
gv adn printed appt sched and avs for pt for OCT adn NOV....will call pt with IRON time

## 2012-11-10 NOTE — Progress Notes (Signed)
Checked in new patient with no financial issues. Gave info for [employer].

## 2012-11-10 NOTE — Telephone Encounter (Signed)
s.w. pt and advised on 10.7.14 appt d/t

## 2012-11-10 NOTE — Progress Notes (Signed)
[facility] CONSULT NOTE  [name], MD  CHIEF COMPLAINTS/PURPOSE OF CONSULTATION: Iron deficiency anemia, on background history of gastric bypass  HISTORY OF PRESENTING ILLNESS:  Natalie Guerrero 47 y.o. female is a pleasant lady accompanied by her husband today. She was noted to have chronic anemia at least since 2012. The patient had background history of Roux-en-Y gastric bypass in 2007 and has lost 100 pounds of weight. She had history of severe menorrhagia secondary to uterine fibroids and had hysterectomy at the age of 71. When she was pregnant with her son, she was told she was anemic. She denies any prior history of blood transfusion. She does have chronic arthritis and back pain and has been taking nonsteroidal anti-inflammatory medication for long time. Most recently she complained of epigastric discomfort. She denies any bleeding such as epistaxis, hematuria, or hematochezia. She has donated blood 3 times in the lifetime but many years ago. She denies any dysphagia. She does have history of pica and has been chewing a lot of ice. Her blood work done by her primary care physician office and most recently in 2014 indicated chronic anemia with a hemoglobin between 9-10. In August, it was as low as 7.6. Iron studies suggest severe iron deficiency. Her B12 level done on 09/29/2012 was 462, folate greater than 20 and ferritin was 11. Hemoglobin was 9.9. She is being referred here for consideration for possible iron infusion. The patient is symptomatic with some fatigue and occasional cramps in the legs. No recent syncopal episode or chest pain.  MEDICAL HISTORY:  Past Medical History  Diagnosis Date  . Thyroid disease hypothroidism  . DDD (degenerative disc disease)   . Iron deficiency anemia, unspecified 11/10/2012    SURGICAL HISTORY: Past Surgical History  Procedure Laterality Date  . Abdominal hysterectomy  1999  . Cesarean section  1991  . Gastric bypass  2007   . Knee surgery  2000  . Cesarean section  1991  . Back surgery    . Gastric bypass      SOCIAL HISTORY: History   Social History  . Marital Status: Married    Spouse Name: N/A    Number of Children: N/A  . Years of Education: N/A   Occupational History  . Not on file.   Social History Main Topics  . Smoking status: Never Smoker   . Smokeless tobacco: Never Used  . Alcohol Use: Yes  . Drug Use: No  . Sexual Activity:    Other Topics Concern  . Not on file   Social History Narrative  . No narrative on file    FAMILY HISTORY: No family history of colon cancer or anemia  ALLERGIES:  is allergic to codeine.  MEDICATIONS: Current outpatient prescriptions:Biotin 1000 MCG tablet, Take 3,000 mcg by mouth 3 (three) times a week., Disp: , Rfl: ;  Cholecalciferol (VITAMIN D-3 PO), Take 3,000 Units by mouth 3 (three) times a week., Disp: , Rfl: ;  CRANBERRY PO, Take 1 tablet by mouth daily., Disp: , Rfl: ;  Cyanocobalamin (VITAMIN B12 PO), Take 1 tablet by mouth daily., Disp: , Rfl: ;  cyclobenzaprine (FLEXERIL) 5 MG tablet, Take 5 mg by mouth daily., Disp: , Rfl:  diclofenac (VOLTAREN) 75 MG EC tablet, Take 75 mg by mouth 2 (two) times daily., Disp: , Rfl: ;  gabapentin (NEURONTIN) 600 MG tablet, Take 600 mg by mouth 3 (three) times daily as needed. For pain, Disp: , Rfl: ;  HYDROcodone-acetaminophen (VICODIN) 5-500 MG  per tablet, Take 1 tablet by mouth every 6 (six) hours as needed. For pain, Disp: , Rfl: ;  IRON PO, Take 1 each by mouth daily. Iron Fusion Plus, Disp: , Rfl:  levothyroxine (SYNTHROID, LEVOTHROID) 50 MCG tablet, Take 50 mcg by mouth daily. Patient only takes it Monday-Fridays., Disp: , Rfl: ;  methocarbamol (ROBAXIN) 500 MG tablet, Take 500 mg by mouth 3 (three) times daily., Disp: , Rfl: ;  Multiple Vitamin (MULTIVITAMIN WITH MINERALS) TABS, Take 1 tablet by mouth daily., Disp: , Rfl: ;  traMADol (ULTRAM) 50 MG tablet, Take 50 mg by mouth every 6 (six) hours as needed.  For pain, Disp: , Rfl:   REVIEW OF SYSTEMS:   Constitutional: Denies fevers, chills or abnormal weight loss Eyes: Denies blurriness of vision, double vision or watery eyes Ears, nose, mouth, throat, and face: Denies mucositis or sore throat Respiratory: Denies cough, dyspnea or wheezes Cardiovascular: Denies palpitation, chest discomfort or lower extremity swelling Gastrointestinal:  Denies nausea, heartburn or change in bowel habits Skin: Denies abnormal skin rashes Lymphatics: Denies new lymphadenopathy or easy bruising Neurological:Denies numbness, tingling or new weaknesses Behavioral/Psych: Mood is stable, no new changes  All other systems were reviewed with the patient and are negative.  PHYSICAL EXAMINATION: ECOG PERFORMANCE STATUS: 1 - Symptomatic but completely ambulatory  Filed Vitals:   11/10/12 0948  BP: 135/69  Pulse: 66  Temp: 98.1 F (36.7 C)  Resp: 18   Filed Weights   11/10/12 0948  Weight: 229 lb 9.6 oz (104.146 kg)    GENERAL:alert, no distress and comfortable she is moderately obese SKIN: skin color, texture, turgor are normal, no rashes or significant lesions EYES: normal, conjunctiva are pink and non-injected, sclera clear OROPHARYNX:no exudate, no erythema and lips, buccal mucosa, and tongue normal  NECK: supple, thyroid normal size, non-tender, without nodularity LYMPH:  no palpable lymphadenopathy in the cervical, axillary or inguinal LUNGS: clear to auscultation and percussion with normal breathing effort HEART: regular rate & rhythm and no murmurs and no lower extremity edema ABDOMEN:abdomen soft, mild tenderness on deep palpation in the epigastric region and normal bowel sounds Musculoskeletal:no cyanosis of digits and no clubbing  PSYCH: alert & oriented x 3 with fluent speech NEURO: no focal motor/sensory deficits  LABORATORY DATA:  I have reviewed the data as listed Recent Results (from the past 2160 hour(s))  CBC WITH DIFFERENTIAL      Status: Abnormal   Collection Time    11/10/12 10:49 AM      Result Value Range   WBC 4.3  3.9 - 10.3 10e3/uL   NEUT# 2.1  1.5 - 6.5 10e3/uL   HGB 10.2 (*) 11.6 - 15.9 g/dL   HCT 40.9 (*) 81.1 - 91.4 %   Platelets 334  145 - 400 10e3/uL   MCV 93.9  79.5 - 101.0 fL   MCH 31.1  25.1 - 34.0 pg   MCHC 33.1  31.5 - 36.0 g/dL   RBC 7.82 (*) 9.56 - 2.13 10e6/uL   RDW 16.0 (*) 11.2 - 14.5 %   lymph# 1.7  0.9 - 3.3 10e3/uL   MONO# 0.3  0.1 - 0.9 10e3/uL   Eosinophils Absolute 0.2  0.0 - 0.5 10e3/uL   Basophils Absolute 0.0  0.0 - 0.1 10e3/uL   NEUT% 47.4  38.4 - 76.8 %   LYMPH% 40.2  14.0 - 49.7 %   MONO% 6.0  0.0 - 14.0 %   EOS% 5.6  0.0 - 7.0 %  BASO% 0.8  0.0 - 2.0 %  LACTATE DEHYDROGENASE (CC13)     Status: None   Collection Time    11/10/12 10:49 AM      Result Value Range   LDH 228  125 - 245 U/L  COMPREHENSIVE METABOLIC PANEL (CC13)     Status: None   Collection Time    11/10/12 10:50 AM      Result Value Range   Sodium 141  136 - 145 mEq/L   Potassium 3.8  3.5 - 5.1 mEq/L   Chloride 105  98 - 109 mEq/L   CO2 28  22 - 29 mEq/L   Glucose 86  70 - 140 mg/dl   BUN 7.5  7.0 - 16.1 mg/dL   Creatinine 0.8  0.6 - 1.1 mg/dL   Total Bilirubin 0.96  0.20 - 1.20 mg/dL   Alkaline Phosphatase 66  40 - 150 U/L   AST 34  5 - 34 U/L   ALT 30  0 - 55 U/L   Total Protein 7.4  6.4 - 8.3 g/dL   Albumin 3.8  3.5 - 5.0 g/dL   Calcium 9.0  8.4 - 04.5 mg/dL  ASSESSMENT:  Iron deficiency anemia I'm background history of Roux-en-Y gastric bypass   PLAN:  #1 iron deficiency anemia I recommend IV on infusion. We discussed about the risk, benefits, and side effects of IV iron and she is in agreement to proceed. We elected to give her an infusion next week on the day of. I will only prescribe one dose of iron ferraheme. I recommend full GI evaluation with EGD and possibly colonoscopy even EGD did not show signs to suggest a GI bleed. She is in agreement. I will see her back in a month with  repeat CBC and iron level after her iron infusion. If her anemia does not resolve, we might have to order an additional workup to rule out other causes of her anemia. At present level, she does not require blood transfusion. #2 fatigue This is likely due to her anemia. Hopefully with replacement therapy she would feel better. #3 chronic arthritis I recommend she refrain from using nonsteroidal anti-inflammatory medication that could potentially cause GI bleed.  All questions were answered. The patient knows to call the clinic with any problems, questions or concerns. I can certainly see the patient much sooner if necessary. No barriers to learning was detected.    Khamila Bassinger, MD 11/10/2012 11:59 AM

## 2012-11-16 ENCOUNTER — Ambulatory Visit (HOSPITAL_BASED_OUTPATIENT_CLINIC_OR_DEPARTMENT_OTHER): Payer: 59

## 2012-11-16 VITALS — BP 125/84 | HR 64 | Temp 98.2°F

## 2012-11-16 DIAGNOSIS — D509 Iron deficiency anemia, unspecified: Secondary | ICD-10-CM

## 2012-11-16 MED ORDER — SODIUM CHLORIDE 0.9 % IV SOLN
Freq: Once | INTRAVENOUS | Status: AC
  Administered 2012-11-16: 09:00:00 via INTRAVENOUS

## 2012-11-16 MED ORDER — FERUMOXYTOL INJECTION 510 MG/17 ML
1020.0000 mg | Freq: Once | INTRAVENOUS | Status: AC
  Administered 2012-11-16: 1020 mg via INTRAVENOUS
  Filled 2012-11-16: qty 34

## 2012-11-16 MED ORDER — SODIUM CHLORIDE 0.9 % IJ SOLN
10.0000 mL | INTRAMUSCULAR | Status: DC | PRN
Start: 2012-11-16 — End: 2012-11-16
  Filled 2012-11-16: qty 10

## 2012-11-16 NOTE — Patient Instructions (Addendum)
Ferumoxytol injection What is this medicine? FERUMOXYTOL is an iron complex. Iron is used to make healthy red blood cells, which carry oxygen and nutrients throughout the body. This medicine is used to treat iron deficiency anemia in people with chronic kidney disease. This medicine may be used for other purposes; ask your health care provider or pharmacist if you have questions. What should I tell my health care provider before I take this medicine? They need to know if you have any of these conditions: -anemia not caused by low iron levels -high levels of iron in the blood -magnetic resonance imaging (MRI) test scheduled -an unusual or allergic reaction to iron, other medicines, foods, dyes, or preservatives -pregnant or trying to get pregnant -breast-feeding How should I use this medicine? This medicine is for infusion into a vein. It is given by a health care professional in a hospital or clinic setting. Talk to your pediatrician regarding the use of this medicine in children. Special care may be needed. Overdosage: If you think you've taken too much of this medicine contact a poison control center or emergency room at once. Overdosage: If you think you have taken too much of this medicine contact a poison control center or emergency room at once. NOTE: This medicine is only for you. Do not share this medicine with others. What if I miss a dose? It is important not to miss your dose. Call your doctor or health care professional if you are unable to keep an appointment. What may interact with this medicine? This medicine may interact with the following medications: -other iron products This list may not describe all possible interactions. Give your health care provider a list of all the medicines, herbs, non-prescription drugs, or dietary supplements you use. Also tell them if you smoke, drink alcohol, or use illegal drugs. Some items may interact with your medicine. What should I watch  for while using this medicine? Visit your doctor or healthcare professional regularly. Tell your doctor or healthcare professional if your symptoms do not start to get better or if they get worse. You may need blood work done while you are taking this medicine. You may need to follow a special diet. Talk to your doctor. Foods that contain iron include: whole grains/cereals, dried fruits, beans, or peas, leafy green vegetables, and organ meats (liver, kidney). What side effects may I notice from receiving this medicine? Side effects that you should report to your doctor or health care professional as soon as possible: -allergic reactions like skin rash, itching or hives, swelling of the face, lips, or tongue -breathing problems -changes in blood pressure -feeling faint or lightheaded, falls -fever or chills -flushing, sweating, or hot feelings -swelling of the ankles or feet Side effects that usually do not require medical attention (Report these to your doctor or health care professional if they continue or are bothersome.): -diarrhea -headache -nausea, vomiting -stomach pain This list may not describe all possible side effects. Call your doctor for medical advice about side effects. You may report side effects to FDA at 1-800-FDA-1088. Where should I keep my medicine? This drug is given in a hospital or clinic and will not be stored at home. NOTE: This sheet is a summary. It may not cover all possible information. If you have questions about this medicine, talk to your doctor, pharmacist, or health care provider.  2012, [employer]vier/[employer] Standard. (10/20/2007 9:48:25 PM) 

## 2012-12-03 ENCOUNTER — Telehealth: Payer: Self-pay | Admitting: Hematology and Oncology

## 2012-12-03 NOTE — Telephone Encounter (Signed)
returned pt call and advised on 10.29.14 appt

## 2012-12-08 ENCOUNTER — Ambulatory Visit (HOSPITAL_BASED_OUTPATIENT_CLINIC_OR_DEPARTMENT_OTHER): Payer: 59 | Admitting: Lab

## 2012-12-08 ENCOUNTER — Telehealth: Payer: Self-pay | Admitting: *Deleted

## 2012-12-08 ENCOUNTER — Ambulatory Visit (HOSPITAL_BASED_OUTPATIENT_CLINIC_OR_DEPARTMENT_OTHER): Payer: 59 | Admitting: Hematology and Oncology

## 2012-12-08 ENCOUNTER — Encounter: Payer: Self-pay | Admitting: Hematology and Oncology

## 2012-12-08 VITALS — BP 130/73 | HR 86 | Temp 97.6°F | Resp 20 | Ht 66.0 in | Wt 239.5 lb

## 2012-12-08 DIAGNOSIS — D509 Iron deficiency anemia, unspecified: Secondary | ICD-10-CM

## 2012-12-08 DIAGNOSIS — E039 Hypothyroidism, unspecified: Secondary | ICD-10-CM

## 2012-12-08 DIAGNOSIS — Z9884 Bariatric surgery status: Secondary | ICD-10-CM

## 2012-12-08 DIAGNOSIS — R5383 Other fatigue: Secondary | ICD-10-CM | POA: Insufficient documentation

## 2012-12-08 HISTORY — DX: Hypothyroidism, unspecified: E03.9

## 2012-12-08 LAB — CBC & DIFF AND RETIC
BASO%: 0.3 % (ref 0.0–2.0)
EOS%: 4.1 % (ref 0.0–7.0)
Immature Retic Fract: 6.3 % (ref 1.60–10.00)
MCH: 31.8 pg (ref 25.1–34.0)
MCHC: 32.7 g/dL (ref 31.5–36.0)
MCV: 97.5 fL (ref 79.5–101.0)
MONO#: 0.5 10*3/uL (ref 0.1–0.9)
NEUT%: 39.8 % (ref 38.4–76.8)
Platelets: 295 10*3/uL (ref 145–400)
RBC: 3.14 10*6/uL — ABNORMAL LOW (ref 3.70–5.45)
Retic %: 2.44 % — ABNORMAL HIGH (ref 0.70–2.10)
WBC: 6.1 10*3/uL (ref 3.9–10.3)
lymph#: 2.9 10*3/uL (ref 0.9–3.3)

## 2012-12-08 LAB — FERRITIN CHCC: Ferritin: 153 ng/ml (ref 9–269)

## 2012-12-08 NOTE — Progress Notes (Signed)
[facility] OFFICE PROGRESS NOTE  Natalie Pitter, MD DIAGNOSIS:  History of iron deficiency anemia, gastric bypass  SUMMARY OF HEMATOLOGIC HISTORY: Please see my detailed dictation from 11/10/2012 for further details. This patient has history of gastric bypass and iron deficiency anemia. She received intravenous iron infusion recently. INTERVAL HISTORY: Natalie Guerrero 47 y.o. female returns for further followup. She denies any infusion reactions. Appointment to see the gastroenterologist is pending. The patient denies any recent signs or symptoms of bleeding such as spontaneous epistaxis, hematuria or hematochezia. The patient complained of feeling fatigued  I have reviewed the past medical history, past surgical history, social history and family history with the patient and they are unchanged from previous note.  ALLERGIES:  is allergic to codeine.  MEDICATIONS:  Current Outpatient Prescriptions  Medication Sig Dispense Refill  . Biotin 1000 MCG tablet Take 3,000 mcg by mouth 3 (three) times a week.      . Cholecalciferol (VITAMIN D-3 PO) Take 3,000 Units by mouth 3 (three) times a week.      Marland Kitchen CRANBERRY PO Take 1 tablet by mouth daily.      . Cyanocobalamin (VITAMIN B12 PO) Take 1 tablet by mouth daily.      . cyclobenzaprine (FLEXERIL) 5 MG tablet Take 5 mg by mouth daily.      . diclofenac (VOLTAREN) 75 MG EC tablet Take 75 mg by mouth 2 (two) times daily.      Marland Kitchen gabapentin (NEURONTIN) 600 MG tablet Take 600 mg by mouth 3 (three) times daily as needed (600 mg twice daily and 900 mg at bed time.). For pain      . HYDROcodone-acetaminophen (VICODIN) 5-500 MG per tablet Take 1 tablet by mouth every 6 (six) hours as needed. For pain      . IRON PO Take 1 each by mouth daily. Iron Fusion Plus      . levothyroxine (SYNTHROID, LEVOTHROID) 50 MCG tablet Take 50 mcg by mouth daily. Patient only takes it Monday-Fridays.      . methocarbamol (ROBAXIN) 500 MG tablet Take 500  mg by mouth 3 (three) times daily.      . Multiple Vitamin (MULTIVITAMIN WITH MINERALS) TABS Take 1 tablet by mouth daily.      . traMADol (ULTRAM) 50 MG tablet Take 50 mg by mouth every 6 (six) hours as needed. For pain       No current facility-administered medications for this visit.     REVIEW OF SYSTEMS:   Constitutional: Denies fevers, chills or night sweats All other systems were reviewed with the patient and are negative.  PHYSICAL EXAMINATION: ECOG PERFORMANCE STATUS: 1 - Symptomatic but completely ambulatory  Filed Vitals:   12/08/12 1415  BP: 130/73  Pulse: 86  Temp: 97.6 F (36.4 C)  Resp: 20   Filed Weights   12/08/12 1415  Weight: 239 lb 8 oz (108.636 kg)   GENERAL:alert, no distress and comfortable NEURO: alert & oriented x 3 with fluent speech, no focal motor/sensory deficits  LABORATORY DATA:  I have reviewed the data as listed ASSESSMENT:  Iron deficiency anemia on background history of gastric bypass  PLAN:  #1 iron deficiency anemia #2 history of gastric bypass  The patient has received intravenous iron infusion and felt no difference. I recommend we checked her ferritin today. I recommend she proceed with EGD and colonoscopy to rule out GI bleed. I am going to send off an additional workup to rule out hemoglobinopathy as a  cause of anemia. #3 hypothyroidism The patient complained of profound fatigue. I will recheck a thyroid function test today.  All questions were answered. The patient knows to call the clinic with any problems, questions or concerns. No barriers to learning was detected.  I spent 15 minutes counseling the patient face to face. The total time spent in the appointment was 20 minutes and more than 50% was on counseling.     [facility], Isadore Palecek, MD 12/08/2012 2:40 PM

## 2012-12-08 NOTE — Telephone Encounter (Signed)
appts made and printed...td 

## 2012-12-10 LAB — T4, FREE: Free T4: 1.05 ng/dL (ref 0.80–1.80)

## 2012-12-10 LAB — DIRECT ANTIGLOBULIN TEST (NOT AT ARMC)
DAT (Complement): NEGATIVE
DAT IgG: NEGATIVE

## 2012-12-10 LAB — HEMOGLOBINOPATHY EVALUATION
Hgb F Quant: 0 % (ref 0.0–2.0)
Hgb S Quant: 0 %

## 2012-12-13 ENCOUNTER — Encounter: Payer: Self-pay | Admitting: Internal Medicine

## 2012-12-13 ENCOUNTER — Ambulatory Visit (INDEPENDENT_AMBULATORY_CARE_PROVIDER_SITE_OTHER): Payer: 59 | Admitting: Internal Medicine

## 2012-12-13 DIAGNOSIS — K912 Postsurgical malabsorption, not elsewhere classified: Secondary | ICD-10-CM

## 2012-12-13 DIAGNOSIS — Z1211 Encounter for screening for malignant neoplasm of colon: Secondary | ICD-10-CM

## 2012-12-13 DIAGNOSIS — D509 Iron deficiency anemia, unspecified: Secondary | ICD-10-CM

## 2012-12-13 DIAGNOSIS — Z9884 Bariatric surgery status: Secondary | ICD-10-CM

## 2012-12-13 MED ORDER — NA SULFATE-K SULFATE-MG SULF 17.5-3.13-1.6 GM/177ML PO SOLN: ORAL | Status: DC

## 2012-12-13 NOTE — Patient Instructions (Signed)
You have been scheduled for a colonoscopy with propofol. Please follow written instructions given to you at your visit today.  Please pick up your prep kit at the pharmacy within the next 1-3 days. If you use inhalers (even only as needed), please bring them with you on the day of your procedure. Your physician has requested that you go to www.startemmi.com and enter the access code given to you at your visit today. This web site gives a general overview about your procedure. However, you should still follow specific instructions given to you by our office regarding your preparation for the procedure.  It is the time of year to have a vaccination to prevent the flu (influenza virus).  Please have this done through your primary care provider or you can get this done at local pharmacies or the [facility]. It would be very helpful if you notify your primary care provider when and where you had the vaccination given by messaging them in My Chart, leaving a message or faxing the information.  I appreciate the opportunity to care for you.

## 2012-12-13 NOTE — Progress Notes (Signed)
Subjective:    Patient ID: Natalie Guerrero, female    DOB: 08/03/1965, 47 y.o.   MRN: [id number]  HPI Patient is a very pleasant middle-aged [place] American woman with a history of laparoscopic gastric bypass surgery a number of years ago. She says before that and after that she has been anemic. She is to have menorrhagia but she's had a hysterectomy. She has had a chronic anemia and tells me her B12 was normal and we know her ferritin was low. She has tried oral iron supplementation but that did not help. She's had just a slight epigastric pain. Otherwise bowel habits are regular there's been no signs of melena or hematochezia. She saw her hematologist, she has been given iron infusion and her ferritin is better.  Allergies  Allergen Reactions  . Codeine     itch   Outpatient Prescriptions Prior to Visit  Medication Sig Dispense Refill  . Biotin 1000 MCG tablet Take 3,000 mcg by mouth 3 (three) times a week.      . Cholecalciferol (VITAMIN D-3 PO) Take 3,000 Units by mouth 3 (three) times a week.      Marland Kitchen CRANBERRY PO Take 1 tablet by mouth daily.      . Cyanocobalamin (VITAMIN B12 PO) Take 1 tablet by mouth daily.      . cyclobenzaprine (FLEXERIL) 5 MG tablet Take 5 mg by mouth daily.      . diclofenac (VOLTAREN) 75 MG EC tablet Take 75 mg by mouth 2 (two) times daily.      Marland Kitchen gabapentin (NEURONTIN) 600 MG tablet Take 600 mg twice daily and 900 mg at bedtime      . HYDROcodone-acetaminophen (VICODIN) 5-500 MG per tablet Take 1 tablet by mouth every 6 (six) hours as needed. For pain      . IRON PO Take 1 each by mouth daily. Iron Fusion Plus      . levothyroxine (SYNTHROID, LEVOTHROID) 50 MCG tablet Take 50 mcg by mouth daily. Patient only takes it Monday-Fridays.      . methocarbamol (ROBAXIN) 500 MG tablet Take 500 mg by mouth 3 (three) times daily.      . Multiple Vitamin (MULTIVITAMIN WITH MINERALS) TABS Take 1 tablet by mouth daily.      . traMADol (ULTRAM) 50 MG tablet Take 50 mg  by mouth every 6 (six) hours as needed. For pain       No facility-administered medications prior to visit.   Past Medical History  Diagnosis Date  . Thyroid disease hypothroidism  . DDD (degenerative disc disease)   . Iron deficiency anemia, unspecified 11/10/2012  . Hypothyroidism 12/08/2012  . Arthritis   . Obesity    Past Surgical History  Procedure Laterality Date  . Abdominal hysterectomy  1999  . Cesarean section  1991  . Gastric bypass  2007  . Knee surgery Left 2000  . Back surgery     History   Social History  . Marital Status: Married    Spouse Name: N/A    Number of Children: N/A  . Years of Education: N/A   Social History Main Topics  . Smoking status: Never Smoker   . Smokeless tobacco: Never Used  . Alcohol Use: 0.5 oz/week    1 drink(s) per week  . Drug Use: No  . Sexual Activity: None   Other Topics Concern  . None   Social History Narrative  . None   Family History  Problem Relation Age of Onset  .  Prostate cancer Father   . Colon cancer Neg Hx   . Diabetes Neg Hx   . Heart failure Mother   . Kidney disease Neg Hx   . Liver disease Neg Hx        Review of Systems IMPROVING FATIGUE + LOW BACK PAIN  All other ROS negative    Objective:   Physical Exam General:  Well-developed, well-nourished and in no acute distress Eyes:  anicteric. ENT:   Mouth and posterior pharynx free of lesions.  Neck:   supple w/o thyromegaly or mass.  Lungs: Clear to auscultation bilaterally. Heart:  S1S2, no rubs, murmurs, gallops. Abdomen:  soft, non-tender except slight LLQ, no hepatosplenomegaly, hernia, or mass and BS+.  Rectal: deferred Lymph:  no cervical or supraclavicular adenopathy. Extremities:   no edema Skin   no rash. Neuro:  A&O x 3.  Psych:  appropriate mood and  Affect.   Data Reviewed: Laparoscopic bariatric surgery operative note Recent hematology notes Recently labs all in the EMR       Assessment & Plan:   1.  Postoperative malabsorption of iron   2. Bariatric surgery status   3. Special screening for malignant neoplasms, colon    No signs of blood loss anc she has a known cause of iron-deficiency from malabsorption after gastric bypass. Plan on screening colonoscopy. The risks and benefits as well as alternatives of endoscopic procedure(s) have been discussed and reviewed. All questions answered.   CC: [name], MD

## 2012-12-14 NOTE — Assessment & Plan Note (Signed)
Malabsorption of iron appears to be the cause.

## 2012-12-15 ENCOUNTER — Encounter: Payer: Self-pay | Admitting: Internal Medicine

## 2012-12-28 ENCOUNTER — Telehealth: Payer: Self-pay | Admitting: Hematology and Oncology

## 2012-12-28 NOTE — Telephone Encounter (Signed)
pt husband called to cx appts and pt will call back to r/s

## 2012-12-29 ENCOUNTER — Ambulatory Visit: Payer: Self-pay | Admitting: Hematology and Oncology

## 2012-12-29 ENCOUNTER — Other Ambulatory Visit: Payer: Self-pay | Admitting: Lab

## 2013-02-22 ENCOUNTER — Encounter: Payer: Self-pay | Admitting: Internal Medicine

## 2013-12-21 ENCOUNTER — Other Ambulatory Visit: Payer: Self-pay | Admitting: Hematology and Oncology

## 2015-06-04 ENCOUNTER — Other Ambulatory Visit: Payer: Self-pay

## 2015-06-04 DIAGNOSIS — Z1231 Encounter for screening mammogram for malignant neoplasm of breast: Secondary | ICD-10-CM

## 2015-06-06 ENCOUNTER — Ambulatory Visit
Admission: RE | Admit: 2015-06-06 | Discharge: 2015-06-06 | Disposition: A | Discharge status: Home / Self Care | Payer: 59 | Source: Ambulatory Visit

## 2015-06-06 DIAGNOSIS — Z1231 Encounter for screening mammogram for malignant neoplasm of breast: Secondary | ICD-10-CM

## 2015-07-03 ENCOUNTER — Telehealth: Payer: Self-pay | Admitting: Hematology and Oncology

## 2015-07-03 NOTE — Telephone Encounter (Signed)
lvm for pt for June

## 2015-07-12 ENCOUNTER — Encounter: Payer: Self-pay | Admitting: Hematology and Oncology

## 2015-07-12 ENCOUNTER — Ambulatory Visit (HOSPITAL_BASED_OUTPATIENT_CLINIC_OR_DEPARTMENT_OTHER): Payer: 59 | Admitting: Hematology and Oncology

## 2015-07-12 VITALS — BP 136/76 | HR 81 | Temp 98.1°F | Resp 18 | Ht 66.0 in | Wt 232.9 lb

## 2015-07-12 DIAGNOSIS — Z9884 Bariatric surgery status: Secondary | ICD-10-CM

## 2015-07-12 DIAGNOSIS — D509 Iron deficiency anemia, unspecified: Secondary | ICD-10-CM

## 2015-07-12 NOTE — Progress Notes (Signed)
[facility] OFFICE PROGRESS NOTE  Gwynneth AlimentSANDERS,ROBYN N, MD SUMMARY OF HEMATOLOGIC HISTORY: CHIEF COMPLAINTS/PURPOSE OF CONSULTATION: Iron deficiency anemia, on background history of gastric bypass EthiopiaKimberley M Laural Guerrero is a pleasant lady accompanied by her husband today. She was noted to have chronic anemia at least since 2012. The patient had background history of Roux-en-Y gastric bypass in 2007 and has lost 100 pounds of weight. She had history of severe menorrhagia secondary to uterine fibroids and had hysterectomy at the age of 50. When she was pregnant with her son, she was told she was anemic. She denies any prior history of blood transfusion. She does have chronic arthritis and back pain and has been taking nonsteroidal anti-inflammatory medication for long time. Most recently she complained of epigastric discomfort. She denies any bleeding such as epistaxis, hematuria, or hematochezia. She has donated blood 3 times in the lifetime but many years ago. She denies any dysphagia. She does have history of pica and has been chewing a lot of ice. Her blood work done by her primary care physician office and most recently in 2014 indicated chronic anemia with a hemoglobin between 9-10. In August, it was as low as 7.6. Iron studies suggest severe iron deficiency. Her B12 level done on 09/29/2012 was 462, folate greater than 20 and ferritin was 11. Hemoglobin was 9.9. She is being referred here for consideration for possible iron infusion. The patient is symptomatic with some fatigue and occasional cramps in the legs. No recent syncopal episode or chest pain. When I saw her in 2014, she received intravenous iron with improvement of anemia. She was subsequently lost to follow-up since October 2014. INTERVAL HISTORY: Leeann MustKimberley M Taddei 50 y.o. female returns for further follow-up. Recently, she was discovered to have significant iron deficiency anemia with hemoglobin in the 8 range. She was placed  on oral iron supplement and her most recent blood work showed that her anemia has improved to greater than 10 g. She denies pica. She denies side effects from oral iron supplements. The patient denies any recent signs or symptoms of bleeding such as spontaneous epistaxis, hematuria or hematochezia.   I have reviewed the past medical history, past surgical history, social history and family history with the patient and they are unchanged from previous note.  ALLERGIES:  is allergic to codeine.  MEDICATIONS:  Current Outpatient Prescriptions  Medication Sig Dispense Refill  . Biotin 1000 MCG tablet Take 3,000 mcg by mouth 3 (three) times a week.    . Cholecalciferol (VITAMIN D-3 PO) Take 3,000 Units by mouth 3 (three) times a week.    . Cyanocobalamin (VITAMIN B12 PO) Take 1 tablet by mouth daily.    . cyclobenzaprine (FLEXERIL) 5 MG tablet Take 5 mg by mouth daily.    Marland Kitchen. docusate sodium (COLACE) 100 MG capsule Take 100 mg by mouth daily.    . DULoxetine (CYMBALTA) 60 MG capsule Take 60 mg by mouth daily.  2  . etodolac (LODINE) 400 MG tablet     . HYDROcodone-acetaminophen (NORCO) 10-325 MG tablet TAKE 1 TABLET 1 TO 3 TIMES DAILY  0  . levothyroxine (SYNTHROID, LEVOTHROID) 50 MCG tablet Take 50 mcg by mouth daily. Patient only takes it Monday-Fridays.    . Multiple Vitamin (MULTIVITAMIN WITH MINERALS) TABS Take 1 tablet by mouth daily.    Marland Kitchen. topiramate (TOPAMAX) 25 MG tablet TAKE 1 TABLET BY MOUTH EVERY 4 HOURS  5  . CRANBERRY PO Take 1 tablet by mouth daily. Reported on 07/12/2015    .  diazepam (VALIUM) 5 MG tablet Reported on 07/12/2015  0   No current facility-administered medications for this visit.     REVIEW OF SYSTEMS:   Constitutional: Denies fevers, chills or night sweats Eyes: Denies blurriness of vision Ears, nose, mouth, throat, and face: Denies mucositis or sore throat Respiratory: Denies cough, dyspnea or wheezes Cardiovascular: Denies palpitation, chest discomfort or lower  extremity swelling Gastrointestinal:  Denies nausea, heartburn or change in bowel habits Skin: Denies abnormal skin rashes Lymphatics: Denies new lymphadenopathy or easy bruising Neurological:Denies numbness, tingling or new weaknesses Behavioral/Psych: Mood is stable, no new changes  All other systems were reviewed with the patient and are negative.  PHYSICAL EXAMINATION: ECOG PERFORMANCE STATUS: 0 - Asymptomatic  Filed Vitals:   07/12/15 1320  BP: 136/76  Pulse: 81  Temp: 98.1 F (36.7 C)  Resp: 18   Filed Weights   07/12/15 1320  Weight: 232 lb 14.4 oz (105.643 kg)    GENERAL:alert, no distress and comfortable SKIN: skin color, texture, turgor are normal, no rashes or significant lesions EYES: normal, Conjunctiva are pink and non-injected, sclera clear Musculoskeletal:no cyanosis of digits and no clubbing  NEURO: alert & oriented x 3 with fluent speech, no focal motor/sensory deficits  LABORATORY DATA:  I have reviewed the data as listed     Component Value Date/Time   NA 141 11/10/2012 1050   NA 137 09/11/2010 0842   K 3.8 11/10/2012 1050   K 4.5 09/11/2010 0842   CL 103 09/11/2010 0842   CO2 28 11/10/2012 1050   CO2 28 09/11/2010 0842   GLUCOSE 86 11/10/2012 1050   GLUCOSE 88 09/11/2010 0842   BUN 7.5 11/10/2012 1050   BUN 6 09/11/2010 0842   CREATININE 0.8 11/10/2012 1050   CREATININE 0.69 09/11/2010 0842   CALCIUM 9.0 11/10/2012 1050   CALCIUM 9.3 09/11/2010 0842   PROT 7.4 11/10/2012 1050   PROT 7.0 09/11/2010 0842   ALBUMIN 3.8 11/10/2012 1050   ALBUMIN 3.7 09/11/2010 0842   AST 34 11/10/2012 1050   AST 23 09/11/2010 0842   ALT 30 11/10/2012 1050   ALT 17 09/11/2010 0842   ALKPHOS 66 11/10/2012 1050   ALKPHOS 60 09/11/2010 0842   BILITOT 0.57 11/10/2012 1050   BILITOT 0.5 09/11/2010 0842   GFRNONAA >60 09/11/2010 0842   GFRAA >60 09/11/2010 0842    No results found for: SPEP, UPEP  Lab Results  Component Value Date   WBC 6.1 12/08/2012    NEUTROABS 2.4 12/08/2012   HGB 10.0* 12/08/2012   HCT 30.6* 12/08/2012   MCV 97.5 12/08/2012   PLT 295 12/08/2012      Chemistry      Component Value Date/Time   NA 141 11/10/2012 1050   NA 137 09/11/2010 0842   K 3.8 11/10/2012 1050   K 4.5 09/11/2010 0842   CL 103 09/11/2010 0842   CO2 28 11/10/2012 1050   CO2 28 09/11/2010 0842   BUN 7.5 11/10/2012 1050   BUN 6 09/11/2010 0842   CREATININE 0.8 11/10/2012 1050   CREATININE 0.69 09/11/2010 0842      Component Value Date/Time   CALCIUM 9.0 11/10/2012 1050   CALCIUM 9.3 09/11/2010 0842   ALKPHOS 66 11/10/2012 1050   ALKPHOS 60 09/11/2010 0842   AST 34 11/10/2012 1050   AST 23 09/11/2010 0842   ALT 30 11/10/2012 1050   ALT 17 09/11/2010 0842   BILITOT 0.57 11/10/2012 1050   BILITOT 0.5 09/11/2010 0865  Her recent blood count from primary care physician's office dated 05/10/2015 show hemoglobin of 10.8, ferritin level of 14 ASSESSMENT & PLAN:  Iron deficiency anemia in a patient s/p gastric bypass The patient actually tolerated iron supplement well with improvement of her anemia closer to 10-11 range. We discussed the risks, benefits, side effects of IV iron and ultimately the patient decided to remain on oral iron supplement alone. I think that is reasonable. I recommend she takes iron supplements daily indefinitely. She can continue close follow-up with primary care doctor for blood, monitoring on an annual basis. I will be happy to see her back in the future if she decides to resume IV iron treatment or if she has intolerable side effects from iron supplements.   All questions were answered. The patient knows to call the clinic with any problems, questions or concerns. No barriers to learning was detected.  I spent 15 minutes counseling the patient face to face. The total time spent in the appointment was 20 minutes and more than 50% was on counseling.     Bertis Ruddy, Kruz Chiu, MD 6/1/20173:26 PM

## 2015-07-12 NOTE — Assessment & Plan Note (Signed)
The patient actually tolerated iron supplement well with improvement of her anemia closer to 10-11 range. We discussed the risks, benefits, side effects of IV iron and ultimately the patient decided to remain on oral iron supplement alone. I think that is reasonable. I recommend she takes iron supplements daily indefinitely. She can continue close follow-up with primary care doctor for blood, monitoring on an annual basis. I will be happy to see her back in the future if she decides to resume IV iron treatment or if she has intolerable side effects from iron supplements.

## 2016-05-01 ENCOUNTER — Other Ambulatory Visit: Payer: Self-pay | Admitting: Internal Medicine

## 2016-05-01 DIAGNOSIS — E049 Nontoxic goiter, unspecified: Secondary | ICD-10-CM

## 2016-05-08 ENCOUNTER — Ambulatory Visit (HOSPITAL_COMMUNITY)
Admission: RE | Admit: 2016-05-08 | Discharge: 2016-05-08 | Disposition: A | Discharge status: Home / Self Care | Payer: 59 | Source: Ambulatory Visit | Attending: Internal Medicine | Admitting: Internal Medicine

## 2016-05-08 DIAGNOSIS — E039 Hypothyroidism, unspecified: Secondary | ICD-10-CM | POA: Diagnosis not present

## 2016-05-08 DIAGNOSIS — E049 Nontoxic goiter, unspecified: Secondary | ICD-10-CM | POA: Diagnosis present

## 2016-06-06 ENCOUNTER — Other Ambulatory Visit: Payer: Self-pay | Admitting: Internal Medicine

## 2016-06-06 DIAGNOSIS — E041 Nontoxic single thyroid nodule: Secondary | ICD-10-CM

## 2016-06-18 ENCOUNTER — Ambulatory Visit
Admission: RE | Admit: 2016-06-18 | Discharge: 2016-06-18 | Disposition: A | Discharge status: Home / Self Care | Payer: BLUE CROSS/BLUE SHIELD | Source: Ambulatory Visit | Attending: Internal Medicine | Admitting: Internal Medicine

## 2016-06-18 ENCOUNTER — Other Ambulatory Visit (HOSPITAL_COMMUNITY)
Admission: RE | Admit: 2016-06-18 | Discharge: 2016-06-18 | Disposition: A | Discharge status: Home / Self Care | Payer: BLUE CROSS/BLUE SHIELD | Source: Ambulatory Visit | Attending: Physician Assistant | Admitting: Physician Assistant

## 2016-06-18 DIAGNOSIS — E041 Nontoxic single thyroid nodule: Secondary | ICD-10-CM | POA: Diagnosis present

## 2016-06-18 NOTE — Procedures (Signed)
PROCEDURE SUMMARY:  Using direct ultrasound guidance, 4 passes were made using 25 g needles into the nodule within the left lobe of the thyroid.   Ultrasound was used to confirm needle placements on all occasions.   Specimens were sent to Pathology for analysis.  WENDY S BLAIR PA-C 06/18/2016 8:32 AM

## 2016-12-31 ENCOUNTER — Encounter: Payer: Self-pay | Admitting: Hematology & Oncology

## 2017-01-15 ENCOUNTER — Other Ambulatory Visit: Payer: Self-pay | Admitting: Family

## 2017-01-15 DIAGNOSIS — D510 Vitamin B12 deficiency anemia due to intrinsic factor deficiency: Secondary | ICD-10-CM

## 2017-01-15 DIAGNOSIS — E559 Vitamin D deficiency, unspecified: Secondary | ICD-10-CM

## 2017-01-15 DIAGNOSIS — D508 Other iron deficiency anemias: Secondary | ICD-10-CM

## 2017-01-15 DIAGNOSIS — Z9884 Bariatric surgery status: Secondary | ICD-10-CM

## 2017-01-15 DIAGNOSIS — K9049 Malabsorption due to intolerance, not elsewhere classified: Secondary | ICD-10-CM

## 2017-01-16 ENCOUNTER — Ambulatory Visit (HOSPITAL_BASED_OUTPATIENT_CLINIC_OR_DEPARTMENT_OTHER): Payer: BLUE CROSS/BLUE SHIELD | Admitting: Family

## 2017-01-16 ENCOUNTER — Other Ambulatory Visit (HOSPITAL_BASED_OUTPATIENT_CLINIC_OR_DEPARTMENT_OTHER): Payer: BLUE CROSS/BLUE SHIELD

## 2017-01-16 ENCOUNTER — Other Ambulatory Visit: Payer: Self-pay

## 2017-01-16 ENCOUNTER — Encounter: Payer: Self-pay | Admitting: Family

## 2017-01-16 DIAGNOSIS — Z9884 Bariatric surgery status: Secondary | ICD-10-CM | POA: Diagnosis not present

## 2017-01-16 DIAGNOSIS — K909 Intestinal malabsorption, unspecified: Secondary | ICD-10-CM

## 2017-01-16 DIAGNOSIS — E559 Vitamin D deficiency, unspecified: Secondary | ICD-10-CM

## 2017-01-16 DIAGNOSIS — D508 Other iron deficiency anemias: Secondary | ICD-10-CM

## 2017-01-16 DIAGNOSIS — D510 Vitamin B12 deficiency anemia due to intrinsic factor deficiency: Secondary | ICD-10-CM

## 2017-01-16 DIAGNOSIS — K9049 Malabsorption due to intolerance, not elsewhere classified: Secondary | ICD-10-CM

## 2017-01-16 LAB — CMP (CANCER CENTER ONLY)
ALT(SGPT): 23 U/L (ref 10–47)
AST: 27 U/L (ref 11–38)
Albumin: 3.7 g/dL (ref 3.3–5.5)
Alkaline Phosphatase: 84 U/L (ref 26–84)
BUN, Bld: 14 mg/dL (ref 7–22)
CO2: 25 mEq/L (ref 18–33)
Calcium: 8.8 mg/dL (ref 8.0–10.3)
Chloride: 98 mEq/L (ref 98–108)
Creat: 1.1 mg/dl (ref 0.6–1.2)
Glucose, Bld: 91 mg/dL (ref 73–118)
Potassium: 3.7 mEq/L (ref 3.3–4.7)
Sodium: 137 mEq/L (ref 128–145)
Total Bilirubin: 0.6 mg/dl (ref 0.20–1.60)
Total Protein: 7.7 g/dL (ref 6.4–8.1)

## 2017-01-16 LAB — CBC WITH DIFFERENTIAL (CANCER CENTER ONLY)
BASO#: 0 10*3/uL (ref 0.0–0.2)
BASO%: 0.5 % (ref 0.0–2.0)
EOS%: 2.2 % (ref 0.0–7.0)
Eosinophils Absolute: 0.2 10*3/uL (ref 0.0–0.5)
HCT: 26.2 % — ABNORMAL LOW (ref 34.8–46.6)
HGB: 8.1 g/dL — ABNORMAL LOW (ref 11.6–15.9)
LYMPH#: 2.5 10*3/uL (ref 0.9–3.3)
LYMPH%: 30 % (ref 14.0–48.0)
MCH: 25.3 pg — ABNORMAL LOW (ref 26.0–34.0)
MCHC: 30.9 g/dL — ABNORMAL LOW (ref 32.0–36.0)
MCV: 82 fL (ref 81–101)
MONO#: 0.5 10*3/uL (ref 0.1–0.9)
MONO%: 6 % (ref 0.0–13.0)
NEUT#: 5.1 10*3/uL (ref 1.5–6.5)
NEUT%: 61.3 % (ref 39.6–80.0)
Platelets: 483 10*3/uL — ABNORMAL HIGH (ref 145–400)
RBC: 3.2 10*6/uL — ABNORMAL LOW (ref 3.70–5.32)
RDW: 17.6 % — AB (ref 11.1–15.7)
WBC: 8.3 10*3/uL (ref 3.9–10.0)

## 2017-01-16 LAB — CHCC SATELLITE - SMEAR

## 2017-01-16 NOTE — Progress Notes (Signed)
Hematology/Oncology Consultation   Name: SHAKEYLA GIEBLER      MRN: [id number]    Location: Room/bed info not found  Date: 01/16/2017 Time:11:19 PM   REFERRING PHYSICIAN: [name], MD  REASON FOR CONSULT: Anemia   DIAGNOSIS:  Iron deficiency anemia secondary to malabsorption after gastric bypass (2007)  HISTORY OF PRESENT ILLNESS: Ms. Messamore is a very pleasant 51 yo African American female with iron deficiency anemia secondary to malabsorption since having her gastric bypass surgery in 2007.  She denies any issue with bleeding, bruising or petechiae.  She is symptomatic with fatigue, palpitations, chewing ice, decreased appetite, dizziness and SOB with over exertion.  She has has been taking Niferex iron supplement daily but with her history of GB it is hard to tell how much she is truly able to absorb.  She was given IV iron several years ago and states that helped her feel tremendously better.  She states that she had her colonoscopy with Dr. Elnoria Howard in April and had 1 benign polyp removed.  She is due or her mammogram and plans to schedule this herself.  She states that her cycles were very heavy due to fibroids and she had a hysterectomy in her 30's. She still has 1 ovary.  She has 1 son and states that her pregnancy was difficult due to spotting and dilating early at 6 months and she was placed on bedrest. She had him by C-section with no problems. No history of miscarriage.  No family history of anemia. No sickle cell disease or trait.  No personal cancer history. Family history of cancer includes: father - prostate.  No c/o fever, chills, n/v, rash, chest pain or changes in bowel or bladder habits.  She has some abdominal tenderness at times and plans to speak with her PCP about possible hernia.  She has had a dry cough with sinus congestion and drainage.  She has the occasional hot flash which she states is mild and tolerable.  She has had multiple surgeries in the past with no  issue with bleeding. Of note, she developed a DVT in the right lower extremity after knee surgery in 2000. She was treated initially with Heparin and then transitions to Coumadin. She has had no recurrent thrombus.  She is on daily synthroid for hypothyroidism. She states that this is controlled.  She has numbness and tingling in her extremities due to chronic back problems and surgeries.  She wears a TENs machine daily.  She is hydrating well and her weight is stable.  She has never been a smoker and does not drink alcohlic beverages.  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   ROS: All other 10 point review of systems is negative.   PAST MEDICAL HISTORY:   Past Medical History:  Diagnosis Date  . Arthritis   . DDD (degenerative disc disease)   . Hypothyroidism 12/08/2012  . Iron deficiency anemia, unspecified 11/10/2012  . Obesity   . Thyroid disease hypothroidism    ALLERGIES: Allergies  Allergen Reactions  . Codeine     itch      MEDICATIONS:  Current Outpatient Medications on File Prior to Visit  Medication Sig Dispense Refill  . magnesium 30 MG tablet Take 30 mg by mouth 2 (two) times daily.    . Biotin 1000 MCG tablet Take 3,000 mcg by mouth 3 (three) times a week.    . Cholecalciferol (VITAMIN D-3 PO) Take 3,000  Units by mouth 3 (three) times a week.    Marland Kitchen CRANBERRY PO Take 1 tablet by mouth daily. Reported on 07/12/2015    . Cyanocobalamin (VITAMIN B12 PO) Take 1 tablet by mouth daily.    . cyclobenzaprine (FLEXERIL) 5 MG tablet Take 5 mg by mouth daily.    . diazepam (VALIUM) 5 MG tablet Reported on 07/12/2015  0  . docusate sodium (COLACE) 100 MG capsule Take 100 mg by mouth daily.    . DULoxetine (CYMBALTA) 60 MG capsule Take 60 mg by mouth daily.  2  . etodolac (LODINE) 400 MG tablet     . HYDROcodone-acetaminophen (NORCO) 10-325 MG tablet TAKE 1 TABLET 1 TO 3 TIMES DAILY  0  . imipramine (TOFRANIL) 25 MG tablet Take 25  mg by mouth 3 (three) times daily.  4  . levothyroxine (SYNTHROID, LEVOTHROID) 50 MCG tablet Take 50 mcg by mouth daily. Patient only takes it Monday-Fridays.    . Multiple Vitamin (MULTIVITAMIN WITH MINERALS) TABS Take 1 tablet by mouth daily.    Marland Kitchen topiramate (TOPAMAX) 25 MG tablet TAKE 1 TABLET BY MOUTH EVERY 4 HOURS  5   No current facility-administered medications on file prior to visit.      PAST SURGICAL HISTORY Past Surgical History:  Procedure Laterality Date  . ABDOMINAL HYSTERECTOMY  1999  . BACK SURGERY    . CESAREAN SECTION  1991  . GASTRIC BYPASS  2007  . KNEE SURGERY Left 2000    FAMILY HISTORY: Family History  Problem Relation Age of Onset  . Prostate cancer Father   . Colon cancer Neg Hx   . Diabetes Neg Hx   . Heart failure Mother   . Kidney disease Neg Hx   . Liver disease Neg Hx     SOCIAL HISTORY:  reports that  has never smoked. she has never used smokeless tobacco. She reports that she drinks about 0.5 oz of alcohol per week. She reports that she does not use drugs.  PERFORMANCE STATUS: The patient's performance status is 1 - Symptomatic but completely ambulatory  PHYSICAL EXAM: Most Recent Vital Signs: Blood pressure 122/76, pulse 96, temperature 98 F (36.7 C), temperature source Oral, resp. rate 18, weight 252 lb (114.3 kg), SpO2 100 %. BP 122/76 (BP Location: Left Arm, Patient Position: Sitting)   Pulse 96   Temp 98 F (36.7 C) (Oral)   Resp 18   Wt 252 lb (114.3 kg)   SpO2 100%   BMI 40.67 kg/m   General Appearance:    Alert, cooperative, no distress, appears stated age  Head:    Normocephalic, without obvious abnormality, atraumatic  Eyes:    PERRL, conjunctiva/corneas clear, EOM's intact, fundi    benign, both eyes        Throat:   Lips, mucosa, and tongue normal; teeth and gums normal  Neck:   Supple, symmetrical, trachea midline, no adenopathy;    thyroid:  no enlargement/tenderness/nodules; no carotid   bruit or JVD  Back:      Symmetric, no curvature, ROM normal, no CVA tenderness  Lungs:     Clear to auscultation bilaterally, respirations unlabored  Chest Wall:    No tenderness or deformity   Heart:    Regular rate and rhythm, S1 and S2 normal, no murmur, rub   or gallop     Abdomen:     Soft, non-tender, bowel sounds active all four quadrants,    no masses, no organomegaly  Extremities:   Extremities normal, atraumatic, no cyanosis or edema  Pulses:   2+ and symmetric all extremities  Skin:   Skin color, texture, turgor normal, no rashes or lesions  Lymph nodes:   Cervical, supraclavicular, and axillary nodes normal  Neurologic:   CNII-XII intact, normal strength, sensation and reflexes    throughout    LABORATORY DATA:  Results for orders placed or performed in visit on 01/16/17 (from the past 48 hour(s))  Smear     Status: None   Collection Time: 01/16/17  3:02 PM  Result Value Ref Range   Smear Result Smear Available   CMP STAT     Status: None   Collection Time: 01/16/17  3:02 PM  Result Value Ref Range   Sodium 137 128 - 145 mEq/L   Potassium 3.7 3.3 - 4.7 mEq/L   Chloride 98 98 - 108 mEq/L   CO2 25 18 - 33 mEq/L   Glucose, Bld 91 73 - 118 mg/dL   BUN, Bld 14 7 - 22 mg/dL   Creat 1.1 0.6 - 1.2 mg/dl   Total Bilirubin 1.910.60 0.20 - 1.60 mg/dl   Alkaline Phosphatase 84 26 - 84 U/L   AST 27 11 - 38 U/L   ALT(SGPT) 23 10 - 47 U/L   Total Protein 7.7 6.4 - 8.1 g/dL   Albumin 3.7 3.3 - 5.5 g/dL   Calcium 8.8 8.0 - 47.810.3 mg/dL  CBC w/Diff     Status: Abnormal   Collection Time: 01/16/17  3:02 PM  Result Value Ref Range   WBC 8.3 3.9 - 10.0 10e3/uL   RBC 3.20 (L) 3.70 - 5.32 10e6/uL   HGB 8.1 (L) 11.6 - 15.9 g/dL   HCT 29.526.2 (L) 62.134.8 - 30.846.6 %   MCV 82 81 - 101 fL   MCH 25.3 (L) 26.0 - 34.0 pg   MCHC 30.9 (L) 32.0 - 36.0 g/dL   RDW 65.717.6 (H) 84.611.1 - 96.215.7 %   Platelets 483 (H) 145 - 400 10e3/uL   NEUT# 5.1 1.5 - 6.5 10e3/uL   LYMPH# 2.5 0.9 - 3.3 10e3/uL   MONO# 0.5 0.1 - 0.9 10e3/uL    Eosinophils Absolute 0.2 0.0 - 0.5 10e3/uL   BASO# 0.0 0.0 - 0.2 10e3/uL   NEUT% 61.3 39.6 - 80.0 %   LYMPH% 30.0 14.0 - 48.0 %   MONO% 6.0 0.0 - 13.0 %   EOS% 2.2 0.0 - 7.0 %   BASO% 0.5 0.0 - 2.0 %      RADIOGRAPHY: No results found.     PATHOLOGY: None   ASSESSMENT/PLAN: Ms. Laural BenesJohnson is a very pleasant 51 yo African American female with iron deficiency anemia secondary to malabsorption since having her gastric bypass surgery in 2007. She is symptomatic at this time with fatigue. SOB with exertion, dizziness, decreased appetite and palpitations.  We will see what her iron studies show and if there has been improvement on oral iron supplement. We can bring her back next week for infusion if needed.  Vitamin D and Vitamin B 12 are ok.  Once her results ar available we will schedule her follow-up.   All questions were answered and she is in agreement with the plan. She will contact the office with any questions or concerns. We can certainly see her much sooner if necessary.  She was discussed with and also seen by Dr. Myna HidalgoEnnever and he is in agreement with the aforementioned.   Emeline GinsSarah Colie Josten     Addendum:  I saw and examined the patient with Maralyn SagoSarah.  I agree with her above assessment.  She does look quite good.  I am very impressed as to how well she has done with the gastric bypass.  Her iron studies clearly are incredibly low.  Her ferritin is only 8 with an iron saturation of 8%.  There is no issues with vitamin B-12.  I looked at her blood smear.  She has some anisocytosis and poikilocytosis.  She had microcytic red blood cells.  She has some hypochromic red blood cells.  All of this appears to be consistent with iron deficiency.  I do not see any issues with her having bleeding.  She had a colonoscopy back in April 2018.  This looked okay.  I do not see any indication for any bone marrow test.  I do not see any indication for radiographic studies.  We will go ahead and  give her IV iron.  I know this will work very nicely.  She may need 2 doses given how low her iron levels are.  We will plan to get her back to see us in another 6 weeks.  By then, I would expect that her hematologic parameters should be much improved.  We spent a good 45 minutes with her.  She is very nice.  We answered her questions.  We reassured her.  She was grateful for all the time that we spent with her.  Christin BachPete Ennever, MD

## 2017-01-17 LAB — VITAMIN B12: Vitamin B12: >2000 pg/mL — ABNORMAL HIGH (ref 232–1245)

## 2017-01-17 LAB — RETICULOCYTES: RETICULOCYTE COUNT: 2.2 % (ref 0.6–2.6)

## 2017-01-17 LAB — VITAMIN D 25 HYDROXY (VIT D DEFICIENCY, FRACTURES): Vitamin D, 25-Hydroxy: 68.5 ng/mL (ref 30.0–100.0)

## 2017-01-20 ENCOUNTER — Encounter: Payer: Self-pay | Admitting: Family

## 2017-01-20 DIAGNOSIS — K909 Intestinal malabsorption, unspecified: Secondary | ICD-10-CM

## 2017-01-20 HISTORY — DX: Intestinal malabsorption, unspecified: K90.9

## 2017-01-20 LAB — IRON AND TIBC
%SAT: 8 % — ABNORMAL LOW (ref 21–57)
Iron: 36 ug/dL — ABNORMAL LOW (ref 41–142)
TIBC: 434 ug/dL (ref 236–444)
UIBC: 397 ug/dL — AB (ref 120–384)

## 2017-01-20 LAB — FERRITIN: FERRITIN: 8 ng/mL — AB (ref 9–269)

## 2017-01-21 ENCOUNTER — Telehealth: Payer: Self-pay | Admitting: Family

## 2017-01-21 NOTE — Telephone Encounter (Signed)
Left message with call back number for lab results. Will await call back.

## 2017-01-22 ENCOUNTER — Telehealth: Payer: Self-pay | Admitting: Family

## 2017-01-22 NOTE — Telephone Encounter (Signed)
I spoke with [name] and went over her lab work from last week in detail. All questions were answered and she has scheduled appointment for IV iron and follow-up.

## 2017-01-28 ENCOUNTER — Ambulatory Visit (HOSPITAL_BASED_OUTPATIENT_CLINIC_OR_DEPARTMENT_OTHER): Payer: BLUE CROSS/BLUE SHIELD

## 2017-01-28 ENCOUNTER — Other Ambulatory Visit: Payer: Self-pay

## 2017-01-28 ENCOUNTER — Other Ambulatory Visit: Payer: Self-pay | Admitting: Family

## 2017-01-28 VITALS — BP 127/76 | HR 101 | Temp 97.8°F | Resp 20

## 2017-01-28 DIAGNOSIS — D509 Iron deficiency anemia, unspecified: Secondary | ICD-10-CM | POA: Diagnosis not present

## 2017-01-28 DIAGNOSIS — D508 Other iron deficiency anemias: Secondary | ICD-10-CM

## 2017-01-28 MED ORDER — SODIUM CHLORIDE 0.9 % IV SOLN
Freq: Once | INTRAVENOUS | Status: AC
  Administered 2017-01-28: 15:00:00 via INTRAVENOUS

## 2017-01-28 MED ORDER — SODIUM CHLORIDE 0.9 % IV SOLN
510.0000 mg | Freq: Once | INTRAVENOUS | Status: AC
  Administered 2017-01-28: 510 mg via INTRAVENOUS
  Filled 2017-01-28: qty 17

## 2017-01-28 NOTE — Patient Instructions (Signed)

## 2017-02-04 ENCOUNTER — Ambulatory Visit (HOSPITAL_BASED_OUTPATIENT_CLINIC_OR_DEPARTMENT_OTHER): Payer: BLUE CROSS/BLUE SHIELD

## 2017-02-04 VITALS — BP 142/81 | HR 83 | Temp 98.2°F | Resp 18

## 2017-02-04 DIAGNOSIS — D508 Other iron deficiency anemias: Secondary | ICD-10-CM

## 2017-02-04 DIAGNOSIS — D509 Iron deficiency anemia, unspecified: Secondary | ICD-10-CM

## 2017-02-04 MED ORDER — SODIUM CHLORIDE 0.9 % IV SOLN
Freq: Once | INTRAVENOUS | Status: AC
  Administered 2017-02-04: 13:00:00 via INTRAVENOUS

## 2017-02-04 MED ORDER — SODIUM CHLORIDE 0.9 % IV SOLN
510.0000 mg | Freq: Once | INTRAVENOUS | Status: AC
  Administered 2017-02-04: 510 mg via INTRAVENOUS
  Filled 2017-02-04: qty 17

## 2017-02-04 NOTE — Patient Instructions (Signed)

## 2017-02-25 LAB — LIPID PANEL
Cholesterol: 207 — AB (ref 0–200)
HDL: 78 — AB (ref 35–70)
LDL Cholesterol: 109
TRIGLYCERIDES: 102 (ref 40–160)

## 2017-02-25 LAB — HEMOGLOBIN A1C: HEMOGLOBIN A1C: 4.9

## 2017-03-05 ENCOUNTER — Other Ambulatory Visit: Payer: Self-pay

## 2017-03-05 ENCOUNTER — Ambulatory Visit: Payer: Self-pay | Admitting: Family

## 2017-04-02 ENCOUNTER — Other Ambulatory Visit: Payer: Self-pay | Admitting: Internal Medicine

## 2017-04-02 DIAGNOSIS — Z1231 Encounter for screening mammogram for malignant neoplasm of breast: Secondary | ICD-10-CM

## 2017-05-25 ENCOUNTER — Other Ambulatory Visit: Payer: Self-pay | Admitting: Obstetrics and Gynecology

## 2017-06-08 ENCOUNTER — Ambulatory Visit
Admission: RE | Admit: 2017-06-08 | Discharge: 2017-06-08 | Disposition: A | Discharge status: Home / Self Care | Payer: BLUE CROSS/BLUE SHIELD | Source: Ambulatory Visit | Attending: Internal Medicine | Admitting: Internal Medicine

## 2017-06-08 DIAGNOSIS — Z1231 Encounter for screening mammogram for malignant neoplasm of breast: Secondary | ICD-10-CM

## 2017-07-22 ENCOUNTER — Other Ambulatory Visit: Payer: Self-pay | Admitting: Internal Medicine

## 2017-07-22 DIAGNOSIS — R1013 Epigastric pain: Secondary | ICD-10-CM

## 2017-08-24 ENCOUNTER — Ambulatory Visit
Admission: RE | Admit: 2017-08-24 | Discharge: 2017-08-24 | Disposition: A | Discharge status: Home / Self Care | Payer: BLUE CROSS/BLUE SHIELD | Source: Ambulatory Visit | Attending: Internal Medicine | Admitting: Internal Medicine

## 2017-08-24 DIAGNOSIS — R1013 Epigastric pain: Secondary | ICD-10-CM

## 2017-10-28 ENCOUNTER — Ambulatory Visit: Payer: Self-pay | Admitting: General Surgery

## 2017-10-28 NOTE — H&P (Signed)
History of Present Illness Natalie Guerrero(Natalie Gowen MD; 10/28/2017 2:35 PM) The patient is a 52 year old female who presents for evaluation of gall stones. Referred by: Dr. Selmer DominionSouthworst Chief Complaint: Abdominal pain  52 year old female who comes in with a history of obesity, hypothyroidism, chronic pain secondary to back fusion, comes in with epigastric abdominal pain. Patient states the pain is been there for approximately a year. She states it's located in the epigastrium and does radiate to the back at times. He states that this is associated with nausea vomiting. She states that she has been trying to come back on her high-fat intake however this appears to be a trigger.  Patient with an ultrasound-guided did review personally which did reveal a 1.2 cm gallstone.    Past Surgical History (Natalie Guerrero, RMA; 10/28/2017 2:17 PM) Cesarean Section - 1  Foot Surgery  Right. Gastric Bypass  Hysterectomy (due to cancer) - Partial  Resection of Stomach  Spinal Surgery - Lower Back   Diagnostic Studies History (Natalie Guerrero, RMA; 10/28/2017 2:17 PM) Colonoscopy  within last year Mammogram  1-3 years ago Pap Smear  1-5 years ago  Allergies (Natalie Guerrero, RMA; 10/28/2017 2:18 PM) No Known Drug Allergies [10/28/2017]: Allergies Reconciled   Medication History (Natalie Guerrero, RMA; 10/28/2017 2:20 PM) HYDROcodone-Acetaminophen (10-325MG  Tablet, Oral) Active. buPROPion HCl ER (XL) (150MG  Tablet ER 24HR, Oral) Active. Cyclobenzaprine HCl (5MG  Tablet, Oral) Active. Ibuprofen (800MG  Tablet, Oral) Active. Imipramine HCl (25MG  Tablet, Oral) Active. Levothyroxine Sodium (50MCG Tablet, Oral) Active. raNITIdine HCl (150MG  Tablet, Oral) Active. Oxtellar XR (600MG  Tablet ER 24HR, Oral) Active. Tylenol 8 Hour (650MG  Tablet ER, Oral) Active. Medications Reconciled  Social History (Natalie Guerrero, RMA; 10/28/2017 2:18 PM) Alcohol use  Occasional alcohol use. Tobacco  use  Never smoker.  Family History (Natalie Guerrero, RMA; 10/28/2017 2:18 PM) Alcohol Abuse  Brother, Father. Arthritis  Brother, Father, Mother. Cerebrovascular Accident  Mother. Depression  Brother, Father, Mother, Sister. Heart Disease  Mother. Heart disease in female family member before age 53  Hypertension  Brother, Father, Mother. Prostate Cancer  Father. Respiratory Condition  Mother. Thyroid problems  Family Members In General.  Pregnancy / Birth History (Natalie Guerrero, RMA; 10/28/2017 2:18 PM) Age at menarche  11 years. Age of menopause  7851-55 Contraceptive History  Depo-provera. Gravida  1 Irregular periods  Maternal age  53-25 Para  1  Other Problems (Natalie Guerrero, RMA; 10/28/2017 2:18 PM) Arthritis  Back Pain  Cholelithiasis  Depression  Gastroesophageal Reflux Disease  Thyroid Disease     Review of Systems Natalie Guerrero(Natalie Kallenbach MD; 10/28/2017 2:32 PM) General Present- Fatigue and Weight Gain. Not Present- Appetite Loss, Chills, Fever, Night Sweats and Weight Loss. HEENT Present- Seasonal Allergies, Sinus Pain, Visual Disturbances and Wears glasses/contact lenses. Not Present- Earache, Hearing Loss, Hoarseness, Nose Bleed, Oral Ulcers, Ringing in the Ears, Sore Throat and Yellow Eyes. Respiratory Not Present- Bloody sputum, Chronic Cough, Difficulty Breathing, Snoring and Wheezing. Cardiovascular Present- Swelling of Extremities. Not Present- Chest Pain, Difficulty Breathing Lying Down, Leg Cramps, Palpitations, Rapid Heart Rate and Shortness of Breath. Gastrointestinal Present- Abdominal Pain, Bloating, Change in Bowel Habits, Constipation, Excessive gas and Gets full quickly at meals. Not Present- Bloody Stool, Chronic diarrhea, Difficulty Swallowing, Hemorrhoids, Indigestion, Nausea, Rectal Pain and Vomiting. Musculoskeletal Present- Back Pain, Joint Pain, Joint Stiffness and Swelling of Extremities. Not Present- Muscle Pain and Muscle  Weakness. Neurological Present- Decreased Memory, Headaches, Numbness, Tingling and Trouble walking. Not Present- Fainting, Seizures,  Tremor and Weakness. Psychiatric Present- Depression. Not Present- Anxiety, Bipolar, Change in Sleep Pattern, Fearful and Frequent crying. Endocrine Present- Heat Intolerance and Hot flashes. Not Present- Cold Intolerance, Excessive Hunger, Hair Changes and New Diabetes. Hematology Not Present- Blood Thinners, Easy Bruising, Excessive bleeding, Gland problems, HIV and Persistent Infections. All other systems negative  Vitals (Natalie Guerrero RMA; 10/28/2017 2:18 PM) 10/28/2017 2:18 PM Weight: 264.4 lb Height: 66in Body Surface Area: 2.25 m Body Mass Index: 42.67 kg/m  Temp.: 98.66F  Pulse: 103 (Regular)  BP: 128/86 (Sitting, Left Arm, Standard)       Assessment & Plan Natalie Filler MD; 10/28/2017 2:36 PM) SYMPTOMATIC CHOLELITHIASIS (K80.20) Impression: 52 year old female with symptomatically cholelithiasis, history of chronic back pain due to fusion, hypothyroidism, obesity.  1. We will proceed to the operating room for a laparoscopic cholecystectomy  2. Risks and benefits were discussed with the patient to generally include, but not limited to: infection, bleeding, possible need for post op ERCP, damage to the bile ducts, bile leak, and possible need for further surgery. Alternatives were offered and described. All questions were answered and the patient voiced understanding of the procedure and wishes to proceed at this point with a laparoscopic cholecystectomy

## 2017-11-11 DIAGNOSIS — H40013 Open angle with borderline findings, low risk, bilateral: Secondary | ICD-10-CM | POA: Diagnosis not present

## 2017-11-18 ENCOUNTER — Other Ambulatory Visit: Payer: Self-pay | Admitting: Internal Medicine

## 2017-12-11 ENCOUNTER — Encounter (HOSPITAL_COMMUNITY): Payer: Self-pay

## 2017-12-11 NOTE — Pre-Procedure Instructions (Signed)
DESARIE FEILD  12/11/2017      [id number] - [place], [place] - [address] AT [facility] [address] [place] [address] Phone: [phone] Fax: [phone]    Your procedure is scheduled on Tuesday November 12.  Report to [facility] Admitting at 8:30 A.M.  Call this number if you have problems the morning of surgery:  [phone]   Remember:  Do not eat or drink after midnight.    Take these medicines the morning of surgery with A SIP OF WATER:   Bupropion (Wellbutrin) Imipramine (Tofranil) Levothyroxine (synthroid) Topiramate (Topamax) Hydrocodone-acetaminophen (Norco) if needed  7 days prior to surgery STOP taking any Aspirin(unless otherwise instructed by your surgeon), Aleve, Naproxen, Ibuprofen, Motrin, Advil, Goody's, BC's, all herbal medications, fish oil, and all vitamins     Do not wear jewelry, make-up or nail polish.  Do not wear lotions, powders, or perfumes, or deodorant.  Do not shave 48 hours prior to surgery.  Men may shave face and neck.  Do not bring valuables to the hospital.  [facility] is not responsible for any belongings or valuables.  Contacts, dentures or bridgework may not be worn into surgery.  Leave your suitcase in the car.  After surgery it may be brought to your room.  For patients admitted to the hospital, discharge time will be determined by your treatment team.  Patients discharged the day of surgery will not be allowed to drive home.   Special instructions:    [place]- Preparing For Surgery  Before surgery, you can play an important role. Because skin is not sterile, your skin needs to be as free of germs as possible. You can reduce the number of germs on your skin by washing with CHG (chlorahexidine gluconate) Soap before surgery.  CHG is an antiseptic cleaner which kills germs and bonds with the skin to continue killing germs even after washing.    Oral  Hygiene is also important to reduce your risk of infection.  Remember - BRUSH YOUR TEETH THE MORNING OF SURGERY WITH YOUR REGULAR TOOTHPASTE  Please do not use if you have an allergy to CHG or antibacterial soaps. If your skin becomes reddened/irritated stop using the CHG.  Do not shave (including legs and underarms) for at least 48 hours prior to first CHG shower. It is OK to shave your face.  Please follow these instructions carefully.   1. Shower the NIGHT BEFORE SURGERY and the MORNING OF SURGERY with CHG.   2. If you chose to wash your hair, wash your hair first as usual with your normal shampoo.  3. After you shampoo, rinse your hair and body thoroughly to remove the shampoo.  4. Use CHG as you would any other liquid soap. You can apply CHG directly to the skin and wash gently with a scrungie or a clean washcloth.   5. Apply the CHG Soap to your body ONLY FROM THE NECK DOWN.  Do not use on open wounds or open sores. Avoid contact with your eyes, ears, mouth and genitals (private parts). Wash Face and genitals (private parts)  with your normal soap.  6. Wash thoroughly, paying special attention to the area where your surgery will be performed.  7. Thoroughly rinse your body with warm water from the neck down.  8. DO NOT shower/wash with your normal soap after using and rinsing off the CHG Soap.  9. Pat yourself dry with a  CLEAN TOWEL.  10. Wear CLEAN PAJAMAS to bed the night before surgery, wear comfortable clothes the morning of surgery  11. Place CLEAN SHEETS on your bed the night of your first shower and DO NOT SLEEP WITH PETS.    Day of Surgery:  Do not apply any deodorants/lotions.  Please wear clean clothes to the hospital/surgery center.   Remember to brush your teeth WITH YOUR REGULAR TOOTHPASTE.    Please read over the following fact sheets that you were given. Coughing and Deep Breathing and Surgical Site Infection Prevention

## 2017-12-14 ENCOUNTER — Encounter (HOSPITAL_COMMUNITY)
Admission: RE | Admit: 2017-12-14 | Discharge: 2017-12-14 | Disposition: A | Discharge status: Home / Self Care | Payer: Medicare Other | Source: Ambulatory Visit | Attending: General Surgery | Admitting: General Surgery

## 2017-12-14 ENCOUNTER — Other Ambulatory Visit: Payer: Self-pay

## 2017-12-14 ENCOUNTER — Encounter (HOSPITAL_COMMUNITY): Payer: Self-pay

## 2017-12-14 DIAGNOSIS — Z01818 Encounter for other preprocedural examination: Secondary | ICD-10-CM | POA: Insufficient documentation

## 2017-12-14 DIAGNOSIS — K8020 Calculus of gallbladder without cholecystitis without obstruction: Secondary | ICD-10-CM | POA: Insufficient documentation

## 2017-12-14 HISTORY — DX: Depression, unspecified: F32.A

## 2017-12-14 HISTORY — DX: Anxiety disorder, unspecified: F41.9

## 2017-12-14 HISTORY — DX: Major depressive disorder, single episode, unspecified: F32.9

## 2017-12-14 LAB — CBC
HCT: 37.8 % (ref 36.0–46.0)
HEMOGLOBIN: 11.2 g/dL — AB (ref 12.0–15.0)
MCH: 28.9 pg (ref 26.0–34.0)
MCHC: 29.6 g/dL — ABNORMAL LOW (ref 30.0–36.0)
MCV: 97.4 fL (ref 80.0–100.0)
PLATELETS: 395 10*3/uL (ref 150–400)
RBC: 3.88 MIL/uL (ref 3.87–5.11)
RDW: 13.7 % (ref 11.5–15.5)
WBC: 5.2 10*3/uL (ref 4.0–10.5)
nRBC: 0 % (ref 0.0–0.2)

## 2017-12-22 ENCOUNTER — Ambulatory Visit (HOSPITAL_COMMUNITY): Payer: Medicare Other | Admitting: Anesthesiology

## 2017-12-22 ENCOUNTER — Other Ambulatory Visit: Payer: Self-pay

## 2017-12-22 ENCOUNTER — Encounter (HOSPITAL_COMMUNITY)
Admission: RE | Disposition: A | Discharge status: Home / Self Care | Payer: Self-pay | Source: Ambulatory Visit | Attending: General Surgery

## 2017-12-22 ENCOUNTER — Ambulatory Visit (HOSPITAL_COMMUNITY)
Admission: RE | Admit: 2017-12-22 | Discharge: 2017-12-22 | Disposition: A | Discharge status: Home / Self Care | Payer: Medicare Other | Source: Ambulatory Visit | Attending: General Surgery | Admitting: General Surgery

## 2017-12-22 DIAGNOSIS — K219 Gastro-esophageal reflux disease without esophagitis: Secondary | ICD-10-CM | POA: Insufficient documentation

## 2017-12-22 DIAGNOSIS — Z79899 Other long term (current) drug therapy: Secondary | ICD-10-CM | POA: Insufficient documentation

## 2017-12-22 DIAGNOSIS — Z9884 Bariatric surgery status: Secondary | ICD-10-CM | POA: Insufficient documentation

## 2017-12-22 DIAGNOSIS — K8010 Calculus of gallbladder with chronic cholecystitis without obstruction: Secondary | ICD-10-CM | POA: Insufficient documentation

## 2017-12-22 DIAGNOSIS — E039 Hypothyroidism, unspecified: Secondary | ICD-10-CM | POA: Insufficient documentation

## 2017-12-22 DIAGNOSIS — K8020 Calculus of gallbladder without cholecystitis without obstruction: Secondary | ICD-10-CM | POA: Diagnosis not present

## 2017-12-22 DIAGNOSIS — F329 Major depressive disorder, single episode, unspecified: Secondary | ICD-10-CM | POA: Insufficient documentation

## 2017-12-22 DIAGNOSIS — K811 Chronic cholecystitis: Secondary | ICD-10-CM | POA: Diagnosis not present

## 2017-12-22 HISTORY — PX: CHOLECYSTECTOMY: SHX55

## 2017-12-22 SURGERY — LAPAROSCOPIC CHOLECYSTECTOMY
Anesthesia: General | Site: Abdomen

## 2017-12-22 MED ORDER — SUCCINYLCHOLINE CHLORIDE 200 MG/10ML IV SOSY
PREFILLED_SYRINGE | INTRAVENOUS | Status: AC
  Filled 2017-12-22: qty 10

## 2017-12-22 MED ORDER — LACTATED RINGERS IV SOLN
INTRAVENOUS | Status: DC
  Administered 2017-12-22 (×2): via INTRAVENOUS

## 2017-12-22 MED ORDER — ACETAMINOPHEN 500 MG PO TABS
1000.0000 mg | ORAL_TABLET | ORAL | Status: AC
  Administered 2017-12-22: 1000 mg via ORAL
  Filled 2017-12-22: qty 2

## 2017-12-22 MED ORDER — FENTANYL CITRATE (PF) 100 MCG/2ML IJ SOLN
INTRAMUSCULAR | Status: AC
  Filled 2017-12-22: qty 2

## 2017-12-22 MED ORDER — SODIUM CHLORIDE 0.9 % IR SOLN
Status: DC | PRN
  Administered 2017-12-22: 1

## 2017-12-22 MED ORDER — LIDOCAINE 2% (20 MG/ML) 5 ML SYRINGE
INTRAMUSCULAR | Status: DC | PRN
  Administered 2017-12-22: 80 mg via INTRAVENOUS

## 2017-12-22 MED ORDER — ROCURONIUM BROMIDE 50 MG/5ML IV SOSY
PREFILLED_SYRINGE | INTRAVENOUS | Status: AC
  Filled 2017-12-22: qty 15

## 2017-12-22 MED ORDER — PROPOFOL 10 MG/ML IV BOLUS
INTRAVENOUS | Status: DC | PRN
  Administered 2017-12-22: 100 mg via INTRAVENOUS

## 2017-12-22 MED ORDER — LIDOCAINE 2% (20 MG/ML) 5 ML SYRINGE
INTRAMUSCULAR | Status: AC
  Filled 2017-12-22: qty 10

## 2017-12-22 MED ORDER — CHLORHEXIDINE GLUCONATE CLOTH 2 % EX PADS: 6.0000 | MEDICATED_PAD | Freq: Once | CUTANEOUS | Status: DC

## 2017-12-22 MED ORDER — CELECOXIB 200 MG PO CAPS
200.0000 mg | ORAL_CAPSULE | ORAL | Status: AC
  Administered 2017-12-22: 200 mg via ORAL
  Filled 2017-12-22: qty 1

## 2017-12-22 MED ORDER — TRAMADOL HCL 50 MG PO TABS: 50.0000 mg | ORAL_TABLET | Freq: Four times a day (QID) | ORAL | 0 refills | Status: DC | PRN

## 2017-12-22 MED ORDER — BUPIVACAINE HCL (PF) 0.25 % IJ SOLN
INTRAMUSCULAR | Status: AC
  Filled 2017-12-22: qty 30

## 2017-12-22 MED ORDER — CEFAZOLIN SODIUM-DEXTROSE 2-4 GM/100ML-% IV SOLN
2.0000 g | INTRAVENOUS | Status: DC
  Filled 2017-12-22: qty 100

## 2017-12-22 MED ORDER — ONDANSETRON HCL 4 MG/2ML IJ SOLN
INTRAMUSCULAR | Status: DC | PRN
  Administered 2017-12-22: 4 mg via INTRAVENOUS

## 2017-12-22 MED ORDER — 0.9 % SODIUM CHLORIDE (POUR BTL) OPTIME
TOPICAL | Status: DC | PRN
  Administered 2017-12-22: 1000 mL

## 2017-12-22 MED ORDER — TRAMADOL HCL 50 MG PO TABS
50.0000 mg | ORAL_TABLET | Freq: Once | ORAL | Status: AC
  Administered 2017-12-22: 50 mg via ORAL

## 2017-12-22 MED ORDER — BUPIVACAINE HCL 0.25 % IJ SOLN
INTRAMUSCULAR | Status: DC | PRN
  Administered 2017-12-22: 10 mL

## 2017-12-22 MED ORDER — MIDAZOLAM HCL 2 MG/2ML IJ SOLN
INTRAMUSCULAR | Status: AC
  Filled 2017-12-22: qty 2

## 2017-12-22 MED ORDER — MIDAZOLAM HCL 2 MG/2ML IJ SOLN
INTRAMUSCULAR | Status: DC | PRN
  Administered 2017-12-22: 2 mg via INTRAVENOUS

## 2017-12-22 MED ORDER — DEXAMETHASONE SODIUM PHOSPHATE 10 MG/ML IJ SOLN
INTRAMUSCULAR | Status: DC | PRN
  Administered 2017-12-22: 10 mg via INTRAVENOUS

## 2017-12-22 MED ORDER — SUGAMMADEX SODIUM 500 MG/5ML IV SOLN
INTRAVENOUS | Status: DC | PRN
  Administered 2017-12-22: 300 mg via INTRAVENOUS

## 2017-12-22 MED ORDER — PROPOFOL 10 MG/ML IV BOLUS
INTRAVENOUS | Status: AC
  Filled 2017-12-22: qty 20

## 2017-12-22 MED ORDER — FENTANYL CITRATE (PF) 100 MCG/2ML IJ SOLN
25.0000 ug | INTRAMUSCULAR | Status: DC | PRN
  Administered 2017-12-22: 50 ug via INTRAVENOUS

## 2017-12-22 MED ORDER — FENTANYL CITRATE (PF) 250 MCG/5ML IJ SOLN
INTRAMUSCULAR | Status: AC
  Filled 2017-12-22: qty 5

## 2017-12-22 MED ORDER — ROCURONIUM BROMIDE 10 MG/ML (PF) SYRINGE
PREFILLED_SYRINGE | INTRAVENOUS | Status: DC | PRN
  Administered 2017-12-22: 50 mg via INTRAVENOUS

## 2017-12-22 MED ORDER — GABAPENTIN 300 MG PO CAPS
300.0000 mg | ORAL_CAPSULE | ORAL | Status: AC
  Administered 2017-12-22: 300 mg via ORAL
  Filled 2017-12-22: qty 1

## 2017-12-22 MED ORDER — TRAMADOL HCL 50 MG PO TABS
ORAL_TABLET | ORAL | Status: AC
  Filled 2017-12-22: qty 1

## 2017-12-22 MED ORDER — FENTANYL CITRATE (PF) 250 MCG/5ML IJ SOLN
INTRAMUSCULAR | Status: DC | PRN
  Administered 2017-12-22: 100 ug via INTRAVENOUS
  Administered 2017-12-22: 50 ug via INTRAVENOUS
  Administered 2017-12-22: 100 ug via INTRAVENOUS

## 2017-12-22 SURGICAL SUPPLY — 40 items
ADH SKN CLS APL DERMABOND .7 (GAUZE/BANDAGES/DRESSINGS) ×1
BAG SPEC RTRVL 10 TROC 200 (ENDOMECHANICALS) ×1
CANISTER SUCT 3000ML PPV (MISCELLANEOUS) ×2 IMPLANT
CHLORAPREP W/TINT 26ML (MISCELLANEOUS) ×2 IMPLANT
CLIP VESOLOCK MED LG 6/CT (CLIP) ×2 IMPLANT
COVER SURGICAL LIGHT HANDLE (MISCELLANEOUS) ×2 IMPLANT
COVER TRANSDUCER ULTRASND (DRAPES) ×2 IMPLANT
COVER WAND RF STERILE (DRAPES) ×1 IMPLANT
DEFOGGER SCOPE WARMER CLEARIFY (MISCELLANEOUS) ×1 IMPLANT
DERMABOND ADVANCED (GAUZE/BANDAGES/DRESSINGS) ×1
DERMABOND ADVANCED .7 DNX12 (GAUZE/BANDAGES/DRESSINGS) ×1 IMPLANT
ELECT REM PT RETURN 9FT ADLT (ELECTROSURGICAL) ×2
ELECTRODE REM PT RTRN 9FT ADLT (ELECTROSURGICAL) ×1 IMPLANT
GLOVE BIO SURGEON STRL SZ7.5 (GLOVE) ×2 IMPLANT
GOWN STRL REUS W/ TWL LRG LVL3 (GOWN DISPOSABLE) ×2 IMPLANT
GOWN STRL REUS W/ TWL XL LVL3 (GOWN DISPOSABLE) ×1 IMPLANT
GOWN STRL REUS W/TWL LRG LVL3 (GOWN DISPOSABLE) ×4
GOWN STRL REUS W/TWL XL LVL3 (GOWN DISPOSABLE) ×2
GRASPER SUT TROCAR 14GX15 (MISCELLANEOUS) ×2 IMPLANT
KIT BASIN OR (CUSTOM PROCEDURE TRAY) ×2 IMPLANT
KIT TURNOVER KIT B (KITS) ×2 IMPLANT
NDL INSUFFLATION 14GA 120MM (NEEDLE) ×1 IMPLANT
NEEDLE INSUFFLATION 14GA 120MM (NEEDLE) ×2 IMPLANT
NS IRRIG 1000ML POUR BTL (IV SOLUTION) ×2 IMPLANT
PAD ARMBOARD 7.5X6 YLW CONV (MISCELLANEOUS) ×2 IMPLANT
POUCH LAPAROSCOPIC INSTRUMENT (MISCELLANEOUS) ×2 IMPLANT
POUCH RETRIEVAL ECOSAC 10 (ENDOMECHANICALS) IMPLANT
POUCH RETRIEVAL ECOSAC 10MM (ENDOMECHANICALS) ×1
SCISSORS LAP 5X35 DISP (ENDOMECHANICALS) ×2 IMPLANT
SET IRRIG TUBING LAPAROSCOPIC (IRRIGATION / IRRIGATOR) ×2 IMPLANT
SLEEVE ENDOPATH XCEL 5M (ENDOMECHANICALS) ×2 IMPLANT
SPECIMEN JAR SMALL (MISCELLANEOUS) ×2 IMPLANT
SUT MNCRL AB 4-0 PS2 18 (SUTURE) ×2 IMPLANT
TOWEL OR 17X24 6PK STRL BLUE (TOWEL DISPOSABLE) ×2 IMPLANT
TOWEL OR 17X26 10 PK STRL BLUE (TOWEL DISPOSABLE) ×2 IMPLANT
TRAY LAPAROSCOPIC MC (CUSTOM PROCEDURE TRAY) ×2 IMPLANT
TROCAR XCEL NON-BLD 11X100MML (ENDOMECHANICALS) ×2 IMPLANT
TROCAR XCEL NON-BLD 5MMX100MML (ENDOMECHANICALS) ×2 IMPLANT
TUBING INSUFFLATION (TUBING) ×2 IMPLANT
WATER STERILE IRR 1000ML POUR (IV SOLUTION) ×2 IMPLANT

## 2017-12-22 NOTE — Op Note (Signed)
12/22/2017  12:33 PM  PATIENT:  Natalie Guerrero  52 y.o. female  PRE-OPERATIVE DIAGNOSIS:  GALLSTONES  POST-OPERATIVE DIAGNOSIS:  GALLSTONES  PROCEDURE:  Procedure(s): LAPAROSCOPIC CHOLECYSTECTOMY (N/A)  SURGEON:  Surgeon(s) and Role:    Axel Filler* Blaze Sandin, MD - Primary  ANESTHESIA:   local and general  EBL:  5cc   BLOOD ADMINISTERED:none  DRAINS: none   LOCAL MEDICATIONS USED:  BUPIVICAINE   SPECIMEN:  Source of Specimen:  gallbladder  DISPOSITION OF SPECIMEN:  PATHOLOGY  COUNTS:  YES  TOURNIQUET:  * No tourniquets in log *  DICTATION: .Dragon Dictation  EBL: <5cc   Complications: none   Counts: reported as correct x 2   Findings:chronic inflamed gallbladder  Indications for procedure: Pt is a 19F with RUQ pain and seen to have gallstones.   Details of the procedure: The patient was taken to the operating and placed in the supine position with bilateral SCDs in place. A time out was called and all facts were verified. A pneumoperitoneum was obtained via A Veress needle technique to a pressure of 14mm of mercury. A 5mm trochar was then placed in the right upper quadrant under visualization, and there were no injuries to any abdominal organs. A 11 mm port was then placed in the umbilical region after infiltrating with local anesthesia under direct visualization. A second epigastric port was placed under direct visualization.   The gallbladder was identified and retracted, the peritoneum was then sharply dissected from the gallbladder and this dissection was carried down to Calot's triangle. The cystic duct was identified and dissected circumferentially and seen going into the gallbladder 360.  The cystic artery was dissected away from the surrounding tissues.   The critical angle was obtained.  2 clips were placed proximally one distally and the cystic duct transected. The cystic artery was identified and 2 clips placed proximally and one distally and transected.  We then proceeded to remove the gallbladder off the hepatic fossa with Bovie cautery. A retrieval bag was then placed in the abdomen and gallbladder placed in the bag. The hepatic fossa was then reexamined and hemostasis was achieved with Bovie cautery and was excellent at this portion of the case. The subhepatic fossa and perihepatic fossa was then irrigated until the effluent was clear. There was seen a small cyst of the liver in the gallbladder fossa. Upon obtaining hemostasis there was some clear fluid that came from the cyst.  There was no bile tint at all.  At the end of the case I reexamined it and it appeared to be decompressed with no further fluid coming from the cyst. The specimen bag and specimen were removed from the abdominal cavity.  The 11 mm trocar fascia was reapproximated with the Endo Close #1 Vicryl x2. The pneumoperitoneum was evacuated and all trochars removed under direct visulalization. The skin was then closed with 4-0 Monocryl and the skin dressed with Steri-Strips, gauze, and tape. The patient was awaken from general anesthesia and taken to the recovery room in stable condition.    PLAN OF CARE: Discharge to home after PACU  PATIENT DISPOSITION:  PACU - hemodynamically stable.   Delay start of Pharmacological VTE agent (>24hrs) due to surgical blood loss or risk of bleeding: not applicable

## 2017-12-22 NOTE — Anesthesia Preprocedure Evaluation (Addendum)
Anesthesia Evaluation  Patient identified by MRN, date of birth, ID band Patient awake    Reviewed: Allergy & Precautions, NPO status , Patient's Chart, lab work & pertinent test results  Airway Mallampati: I  TM Distance: >3 FB Neck ROM: Full    Dental no notable dental hx. (+) Teeth Intact, Dental Advisory Given   Pulmonary neg pulmonary ROS,    Pulmonary exam normal breath sounds clear to auscultation       Cardiovascular negative cardio ROS Normal cardiovascular exam Rhythm:Regular Rate:Normal     Neuro/Psych PSYCHIATRIC DISORDERS Anxiety Depression negative neurological ROS     GI/Hepatic negative GI ROS, Neg liver ROS,   Endo/Other  Hypothyroidism   Renal/GU negative Renal ROS  negative genitourinary   Musculoskeletal  (+) Arthritis ,   Abdominal   Peds  Hematology  (+) Blood dyscrasia, anemia ,   Anesthesia Other Findings   Reproductive/Obstetrics                            Anesthesia Physical Anesthesia Plan  ASA: II  Anesthesia Plan: General   Post-op Pain Management:    Induction: Intravenous  PONV Risk Score and Plan: 3 and Midazolam, Dexamethasone and Ondansetron  Airway Management Planned: Oral ETT  Additional Equipment:   Intra-op Plan:   Post-operative Plan: Extubation in OR  Informed Consent: I have reviewed the patients History and Physical, chart, labs and discussed the procedure including the risks, benefits and alternatives for the proposed anesthesia with the patient or authorized representative who has indicated his/her understanding and acceptance.   Dental advisory given  Plan Discussed with: CRNA  Anesthesia Plan Comments:         Anesthesia Quick Evaluation

## 2017-12-22 NOTE — Transfer of Care (Signed)
Immediate Anesthesia Transfer of Care Note  Patient: Natalie Guerrero  Procedure(s) Performed: LAPAROSCOPIC CHOLECYSTECTOMY (N/A Abdomen)  Patient Location: PACU  Anesthesia Type:General  Level of Consciousness: awake, alert  and oriented  Airway & Oxygen Therapy: Patient Spontanous Breathing and Patient connected to nasal cannula oxygen  Post-op Assessment: Report given to RN and Post -op Vital signs reviewed and stable  Post vital signs: Reviewed and stable  Last Vitals:  Vitals Value Taken Time  BP 119/77 12/22/2017 12:52 PM  Temp    Pulse 92 12/22/2017 12:54 PM  Resp 16 12/22/2017 12:54 PM  SpO2 100 % 12/22/2017 12:54 PM  Vitals shown include unvalidated device data.  Last Pain:  Vitals:   12/22/17 1250  TempSrc:   PainSc: (P) 7       Patients Stated Pain Goal: 2 (12/22/17 0855)  Complications: No apparent anesthesia complications

## 2017-12-22 NOTE — Discharge Instructions (Signed)
[facility] ______[facility] °LAPAROSCOPIC SURGERY: POST OP INSTRUCTIONS °Always review your discharge instruction sheet given to you by the facility where your surgery was performed. °IF YOU HAVE DISABILITY OR FAMILY LEAVE FORMS, YOU MUST BRING THEM TO THE OFFICE FOR PROCESSING.   °DO NOT GIVE THEM TO YOUR DOCTOR. ° °1. A prescription for pain medication may be given to you upon discharge.  Take your pain medication as prescribed, if needed.  If narcotic pain medicine is not needed, then you may take acetaminophen (Tylenol) or ibuprofen (Advil) as needed. °2. Take your usually prescribed medications unless otherwise directed. °3. If you need a refill on your pain medication, please contact your pharmacy.  They will contact our office to request authorization. Prescriptions will not be filled after 5pm or on week-ends. °4. You should follow a light diet the first few days after arrival home, such as soup and crackers, etc.  Be sure to include lots of fluids daily. °5. Most patients will experience some swelling and bruising in the area of the incisions.  Ice packs will help.  Swelling and bruising can take several days to resolve.  °6. It is common to experience some constipation if taking pain medication after surgery.  Increasing fluid intake and taking a stool softener (such as Colace) will usually help or prevent this problem from occurring.  A mild laxative (Milk of Magnesia or Miralax) should be taken according to package instructions if there are no bowel movements after 48 hours. °7. Unless discharge instructions indicate otherwise, you may remove your bandages 24-48 hours after surgery, and you may shower at that time.  You may have steri-strips (small skin tapes) in place directly over the incision.  These strips should be left on the skin for 7-10 days.  If your surgeon used skin glue on the incision, you may shower in 24 hours.  The glue will flake off over the next 2-3 weeks.  Any sutures or  staples will be removed at the office during your follow-up visit. °8. ACTIVITIES:  You may resume regular (light) daily activities beginning the next day--such as daily self-care, walking, climbing stairs--gradually increasing activities as tolerated.  You may have sexual intercourse when it is comfortable.  Refrain from any heavy lifting or straining until approved by your doctor. °a. You may drive when you are no longer taking prescription pain medication, you can comfortably wear a seatbelt, and you can safely maneuver your car and apply brakes. °b. RETURN TO WORK:  __________________________________________________________ °9. You should see your doctor in the office for a follow-up appointment approximately 2-3 weeks after your surgery.  Make sure that you call for this appointment within a day or two after you arrive home to insure a convenient appointment time. °10. OTHER INSTRUCTIONS: __________________________________________________________________________________________________________________________ __________________________________________________________________________________________________________________________ °WHEN TO CALL YOUR DOCTOR: °1. Fever over 101.0 °2. Inability to urinate °3. Continued bleeding from incision. °4. Increased pain, redness, or drainage from the incision. °5. Increasing abdominal pain ° °The clinic staff is available to answer your questions during regular business hours.  Please don’t hesitate to call and ask to speak to one of the nurses for clinical concerns.  If you have a medical emergency, go to the nearest emergency room or call 911.  A surgeon from [facility] is always on call at the hospital. °[address], [address], [place], [place]  [address] ? [address] [address], , [place]   [address] °[phone] ? [phone] ? FAX [phone] °Web site:   [email or web address] °

## 2017-12-22 NOTE — H&P (Signed)
History of Present Illness The patient is a 52 year old female who presents for evaluation of gall stones. Referred by: Dr. Selmer Dominion Chief Complaint: Abdominal pain  52 year old female who comes in with a history of obesity, hypothyroidism, chronic pain secondary to back fusion, comes in with epigastric abdominal pain. Patient states the pain is been there for approximately a year. She states it's located in the epigastrium and does radiate to the back at times. He states that this is associated with nausea vomiting. She states that she has been trying to come back on her high-fat intake however this appears to be a trigger.  Patient with an ultrasound-guided did review personally which did reveal a 1.2 cm gallstone.    Past Surgical History  Cesarean Section - 1  Foot Surgery  Right. Gastric Bypass  Hysterectomy (due to cancer) - Partial  Resection of Stomach  Spinal Surgery - Lower Back   Diagnostic Studies History  Colonoscopy  within last year Mammogram  1-3 years ago Pap Smear  1-5 years ago  Allergies  No Known Drug Allergies [10/28/2017]: Allergies Reconciled   Medication History HYDROcodone-Acetaminophen (10-325MG  Tablet, Oral) Active. buPROPion HCl ER (XL) (150MG  Tablet ER 24HR, Oral) Active. Cyclobenzaprine HCl (5MG  Tablet, Oral) Active. Ibuprofen (800MG  Tablet, Oral) Active. Imipramine HCl (25MG  Tablet, Oral) Active. Levothyroxine Sodium ( Tablet, Oral) Active. raNITIdine HCl (150MG  Tablet, Oral) Active. Oxtellar XR (600MG  Tablet ER 24HR, Oral) Active. Tylenol 8 Hour (650MG  Tablet ER, Oral) Active. Medications Reconciled  Social History  Alcohol use  Occasional alcohol use. Tobacco use  Never smoker.  Family History  Alcohol Abuse  Brother, Father. Arthritis  Brother, Father, Mother. Cerebrovascular Accident  Mother. Depression  Brother, Father, Mother, Sister. Heart Disease  Mother. Heart disease in  female family member before age 71  Hypertension  Brother, Father, Mother. Prostate Cancer  Father. Respiratory Condition  Mother. Thyroid problems  Family Members In General.  Pregnancy / Birth History  Age at menarche  11 years. Age of menopause  45-55 Contraceptive History  Depo-provera. Gravida  1 Irregular periods  Maternal age  68-25 Para  1  Other Problems Arthritis  Back Pain  Cholelithiasis  Depression  Gastroesophageal Reflux Disease  Thyroid Disease     Review of Systems  General Present- Fatigue and Weight Gain. Not Present- Appetite Loss, Chills, Fever, Night Sweats and Weight Loss. HEENT Present- Seasonal Allergies, Sinus Pain, Visual Disturbances and Wears glasses/contact lenses. Not Present- Earache, Hearing Loss, Hoarseness, Nose Bleed, Oral Ulcers, Ringing in the Ears, Sore Throat and Yellow Eyes. Respiratory Not Present- Bloody sputum, Chronic Cough, Difficulty Breathing, Snoring and Wheezing. Cardiovascular Present- Swelling of Extremities. Not Present- Chest Pain, Difficulty Breathing Lying Down, Leg Cramps, Palpitations, Rapid Heart Rate and Shortness of Breath. Gastrointestinal Present- Abdominal Pain, Bloating, Change in Bowel Habits, Constipation, Excessive gas and Gets full quickly at meals. Not Present- Bloody Stool, Chronic diarrhea, Difficulty Swallowing, Hemorrhoids, Indigestion, Nausea, Rectal Pain and Vomiting. Musculoskeletal Present- Back Pain, Joint Pain, Joint Stiffness and Swelling of Extremities. Not Present- Muscle Pain and Muscle Weakness. Neurological Present- Decreased Memory, Headaches, Numbness, Tingling and Trouble walking. Not Present- Fainting, Seizures, Tremor and Weakness. Psychiatric Present- Depression. Not Present- Anxiety, Bipolar, Change in Sleep Pattern, Fearful and Frequent crying. Endocrine Present- Heat Intolerance and Hot flashes. Not Present- Cold Intolerance, Excessive Hunger, Hair Changes and New  Diabetes. Hematology Not Present- Blood Thinners, Easy Bruising, Excessive bleeding, Gland problems, HIV and Persistent Infections. All other systems negative  BP 137/85   Pulse  84   Temp 98.2 F (36.8 C) (Oral)   Resp 18   Ht 5\' 6"  (1.676 m)   Wt 117 kg   SpO2 100%   BMI 41.64 kg/m    Assessment & Plan SYMPTOMATIC CHOLELITHIASIS (K80.20) Impression: 52 year old female with symptomatically cholelithiasis, history of chronic back pain due to fusion, hypothyroidism, obesity.  1. We will proceed to the operating room for a laparoscopic cholecystectomy  2. Risks and benefits were discussed with the patient to generally include, but not limited to: infection, bleeding, possible need for post op ERCP, damage to the bile ducts, bile leak, and possible need for further surgery. Alternatives were offered and described. All questions were answered and the patient voiced understanding of the procedure and wishes to proceed at this point with a laparoscopic cholecystectomy

## 2017-12-22 NOTE — Anesthesia Procedure Notes (Signed)
Procedure Name: Intubation Date/Time: 12/22/2017 11:56 AM Performed by: Freddrick March, MD Pre-anesthesia Checklist: Patient identified, Emergency Drugs available, Suction available and Patient being monitored Patient Re-evaluated:Patient Re-evaluated prior to induction Oxygen Delivery Method: Circle System Utilized Preoxygenation: Pre-oxygenation with 100% oxygen Induction Type: IV induction Ventilation: Mask ventilation without difficulty Laryngoscope Size: Mac and 3 Grade View: Grade II Tube type: Oral Number of attempts: 1 Airway Equipment and Method: Stylet and Oral airway Placement Confirmation: ETT inserted through vocal cords under direct vision,  positive ETCO2 and breath sounds checked- equal and bilateral Secured at: 21.5 cm Tube secured with: Tape Dental Injury: Teeth and Oropharynx as per pre-operative assessment

## 2017-12-23 ENCOUNTER — Encounter (HOSPITAL_COMMUNITY): Payer: Self-pay | Admitting: General Surgery

## 2017-12-23 NOTE — Anesthesia Postprocedure Evaluation (Signed)
Anesthesia Post Note  Patient: Leeann MustKimberley M Julian  Procedure(s) Performed: LAPAROSCOPIC CHOLECYSTECTOMY (N/A Abdomen)     Patient location during evaluation: PACU Anesthesia Type: General Level of consciousness: awake and alert Pain management: pain level controlled Vital Signs Assessment: post-procedure vital signs reviewed and stable Respiratory status: spontaneous breathing, nonlabored ventilation, respiratory function stable and patient connected to nasal cannula oxygen Cardiovascular status: blood pressure returned to baseline and stable Postop Assessment: no apparent nausea or vomiting Anesthetic complications: no    Last Vitals:  Vitals:   12/22/17 1335 12/22/17 1345  BP: 115/90 (!) 145/92  Pulse: 84 74  Resp:    Temp:    SpO2: 100% 100%    Last Pain:  Vitals:   12/22/17 1309  TempSrc:   PainSc: 5    Pain Goal: Patients Stated Pain Goal: 2 (12/22/17 0855)               Romie Jumperhelsey L Prosperity Darrough

## 2017-12-28 ENCOUNTER — Ambulatory Visit: Payer: Self-pay | Admitting: Internal Medicine

## 2017-12-31 ENCOUNTER — Other Ambulatory Visit: Payer: Self-pay

## 2017-12-31 ENCOUNTER — Ambulatory Visit (INDEPENDENT_AMBULATORY_CARE_PROVIDER_SITE_OTHER): Payer: Medicare Other | Admitting: Internal Medicine

## 2017-12-31 ENCOUNTER — Encounter: Payer: Self-pay | Admitting: Internal Medicine

## 2017-12-31 VITALS — BP 108/66 | HR 90 | Temp 98.2°F | Ht 64.5 in | Wt 261.4 lb

## 2017-12-31 DIAGNOSIS — M5432 Sciatica, left side: Secondary | ICD-10-CM

## 2017-12-31 DIAGNOSIS — E039 Hypothyroidism, unspecified: Secondary | ICD-10-CM

## 2017-12-31 DIAGNOSIS — Z23 Encounter for immunization: Secondary | ICD-10-CM | POA: Diagnosis not present

## 2017-12-31 DIAGNOSIS — Z0000 Encounter for general adult medical examination without abnormal findings: Secondary | ICD-10-CM

## 2017-12-31 DIAGNOSIS — M5431 Sciatica, right side: Secondary | ICD-10-CM | POA: Diagnosis not present

## 2017-12-31 DIAGNOSIS — F331 Major depressive disorder, recurrent, moderate: Secondary | ICD-10-CM

## 2017-12-31 DIAGNOSIS — Z79899 Other long term (current) drug therapy: Secondary | ICD-10-CM

## 2017-12-31 DIAGNOSIS — Z6841 Body Mass Index (BMI) 40.0 and over, adult: Secondary | ICD-10-CM

## 2017-12-31 LAB — POCT URINALYSIS DIPSTICK
Bilirubin, UA: NEGATIVE
Blood, UA: NEGATIVE
Glucose, UA: NEGATIVE
Ketones, UA: NEGATIVE
Leukocytes, UA: NEGATIVE
NITRITE UA: NEGATIVE
PROTEIN UA: NEGATIVE
Spec Grav, UA: 1.02 (ref 1.010–1.025)
Urobilinogen, UA: 0.2 E.U./dL
pH, UA: 6.5 (ref 5.0–8.0)

## 2017-12-31 MED ORDER — LEVOTHYROXINE SODIUM 100 MCG PO TABS: 100.0000 ug | ORAL_TABLET | Freq: Every day | ORAL | 1 refills | Status: DC

## 2017-12-31 NOTE — Patient Instructions (Signed)
Obesity, Adult Obesity is having too much body fat. If you have a BMI of 30 or more, you are obese. BMI is a number that explains how much body fat you have. Obesity is often caused by taking in (consuming) more calories than your body uses. Obesity can cause serious health problems. Changing your lifestyle can help to treat obesity. Follow these instructions at home: Eating and drinking   Follow advice from your doctor about what to eat and drink. Your doctor may tell you to: ? Cut down on (limit) fast foods, sweets, and processed snack foods. ? Choose low-fat options. For example, choose low-fat milk instead of whole milk. ? Eat 5 or more servings of fruits or vegetables every day. ? Eat at home more often. This gives you more control over what you eat. ? Choose healthy foods when you eat out. ? Learn what a healthy portion size is. A portion size is the amount of a certain food that is healthy for you to eat at one time. This is different for each person. ? Keep low-fat snacks available. ? Avoid sugary drinks. These include soda, fruit juice, iced tea that is sweetened with sugar, and flavored milk. ? Eat a healthy breakfast.  Drink enough water to keep your pee (urine) clear or pale yellow.  Do not go without eating for long periods of time (do not fast).  Do not go on popular or trendy diets (fad diets). Physical Activity  Exercise often, as told by your doctor. Ask your doctor: ? What types of exercise are safe for you. ? How often you should exercise.  Warm up and stretch before being active.  Do slow stretching after being active (cool down).  Rest between times of being active. Lifestyle  Limit how much time you spend in front of your TV, computer, or video game system (be less sedentary).  Find ways to reward yourself that do not involve food.  Limit alcohol intake to no more than 1 drink a day for nonpregnant women and 2 drinks a day for men. One drink equals 12 oz  of beer, 5 oz of wine, or 1 oz of hard liquor. General instructions  Keep a weight loss journal. This can help you keep track of: ? The food that you eat. ? The exercise that you do.  Take over-the-counter and prescription medicines only as told by your doctor.  Take vitamins and supplements only as told by your doctor.  Think about joining a support group. Your doctor may be able to help with this.  Keep all follow-up visits as told by your doctor. This is important. Contact a doctor if:  You cannot meet your weight loss goal after you have changed your diet and lifestyle for 6 weeks. This information is not intended to replace advice given to you by your health care provider. Make sure you discuss any questions you have with your health care provider. Document Released: 04/21/2011 Document Revised: 07/05/2015 Document Reviewed: 11/15/2014 Elsevier Interactive Patient Education  2018 [employer]   [name] , Thank you for taking time to come for your Medicare Wellness Visit. I appreciate your ongoing commitment to your health goals. Please review the following plan we discussed and let me know if I can assist you in the future.   These are the goals we discussed: Goals    . Exercise 3x per week (30 min per time)    . Weight (lb) < 200 lb (90.7 kg)  Our initial goal is to achieve BMI less than 39.        This is a list of the screening recommended for you and due dates:  Health Maintenance  Topic Date Due  . HIV Screening  01/27/1980  . Tetanus Vaccine  01/27/1984  . Colon Cancer Screening  01/27/2015  . Pap Smear  07/31/2015  . Flu Shot  02/22/2019*  . Mammogram  06/09/2019  *Topic was postponed. The date shown is not the original due date.

## 2017-12-31 NOTE — Progress Notes (Signed)
Subjective:    Natalie Guerrero is a 52 y.o. female who presents for a Welcome to Medicare exam.   Review of Systems  Review of Systems  Constitutional: Negative.   HENT: Negative.   Eyes: Negative.   Respiratory: Negative.   Cardiovascular: Negative.   Gastrointestinal: Negative.   Genitourinary: Negative.   Musculoskeletal: Positive for back pain (She has chronic back pain. Has h/o sciatica. ).  Skin: Negative.   Neurological: Positive for tingling.  Endo/Heme/Allergies: Negative.   Psychiatric/Behavioral: Negative.     Cardiac Risk Factors include: none  Obesity, postmenopausal     Objective:    Today's Vitals   12/31/17 1151  BP: 108/66  Pulse: 90  Temp: 98.2 F (36.8 C)  TempSrc: Oral  Weight: 261 lb 6.4 oz (118.6 kg)  Height: 5' 4.5" (1.638 m)  Body mass index is 44.18 kg/m.  Medications Outpatient Encounter Medications as of 12/31/2017  Medication Sig  . Apple Cider Vinegar 600 MG CAPS Take 2,400 mg by mouth 2 (two) times daily.  . Biotin 10 MG CAPS Take 30 mg by mouth daily.   Marland Kitchen buPROPion (WELLBUTRIN XL) 150 MG 24 hr tablet Take 150 mg by mouth daily.  . Carboxymethylcellulose Sodium (REFRESH LIQUIGEL) 1 % GEL Place 1-2 drops into both eyes 4 (four) times daily as needed (for dry eyes).  . Cholecalciferol (VITAMIN D3) 2000 units TABS Take 10,000 Units by mouth daily.  . COLLAGEN PO Take 2 Scoops by mouth daily.  . Cyanocobalamin (VITAMIN B-12) 5000 MCG SUBL Take 10,000 mcg by mouth daily.  . cyclobenzaprine (FLEXERIL) 5 MG tablet Take 5 mg by mouth 4 (four) times daily.   Marland Kitchen Dextromethorphan-guaiFENesin (MUCINEX DM MAXIMUM STRENGTH) 60-1200 MG TB12 Take 1 tablet by mouth 2 (two) times daily.  . diclofenac (VOLTAREN) 75 MG EC tablet Take 75 mg by mouth 2 (two) times daily.  . diphenhydrAMINE (BENADRYL) 25 mg capsule Take 50 mg by mouth 2 (two) times daily.  Marland Kitchen docusate sodium (COLACE) 100 MG capsule Take 200 mg by mouth 2 (two) times daily.   Marland Kitchen FIBER  PO Take 4 capsules by mouth daily.  . Glucosamine-Chondroit-Vit C-Mn (GLUCOSAMINE 1500 COMPLEX PO) Take 4 tablets by mouth daily.  Marland Kitchen HYDROcodone-acetaminophen (NORCO) 10-325 MG tablet Take 1 tablet by mouth 3 (three) times daily.   Marland Kitchen imipramine (TOFRANIL) 25 MG tablet Take 25 mg by mouth 3 (three) times daily.  . iron polysaccharides (NIFEREX) 150 MG capsule Take 150 mg by mouth daily.  Marland Kitchen Lifitegrast (XIIDRA) 5 % SOLN Place 1 drop into both eyes 2 (two) times daily.  . Liniments (SALONPAS PAIN RELIEF PATCH EX) Apply 1 patch topically daily.  Marland Kitchen MAGNESIUM PO Take 5 tablets by mouth daily.   Marland Kitchen OVER THE COUNTER MEDICATION Take 4 tablets by mouth daily. Liver Aid Supplement  . OVER THE COUNTER MEDICATION Apply 1 application topically 2 (two) times daily. Beck and Blackley Arthritis Cream  . Polyethyl Glycol-Propyl Glycol (SYSTANE ULTRA) 0.4-0.3 % SOLN Place 2 drops into both eyes 2 (two) times daily as needed (for dry eyes).  . ranitidine (ZANTAC) 150 MG tablet TAKE 1 TABLET TWICE DAILY (Patient taking differently: Take 150 mg by mouth 2 (two) times daily. )  . topiramate (TOPAMAX) 100 MG tablet Take 100 mg by mouth 4 (four) times daily.   . traMADol (ULTRAM) 50 MG tablet Take 1 tablet (50 mg total) by mouth every 6 (six) hours as needed.  . Turmeric Curcumin 500 MG CAPS Take  1,000 mg by mouth 2 (two) times daily.  Marland Kitchen ZINC-VITAMIN C PO Take 6 tablets by mouth daily.  . [DISCONTINUED] levothyroxine (SYNTHROID, LEVOTHROID) 100 MCG tablet Take 100 mcg by mouth daily before breakfast.    No facility-administered encounter medications on file as of 12/31/2017.      History: Past Medical History:  Diagnosis Date  . Anxiety   . Arthritis   . DDD (degenerative disc disease)   . Depression   . Hypothyroidism 12/08/2012  . Iron deficiency anemia, unspecified 11/10/2012  . Iron malabsorption 01/20/2017  . Obesity   . Thyroid disease hypothroidism   Past Surgical History:  Procedure Laterality Date    . ABDOMINAL HYSTERECTOMY  1999  . BACK SURGERY    . CESAREAN SECTION  1991  . CHOLECYSTECTOMY N/A 12/22/2017   Procedure: LAPAROSCOPIC CHOLECYSTECTOMY;  Surgeon: Ralene Ok, MD;  Location: [facility];  Service: General;  Laterality: N/A;  . GASTRIC BYPASS  2007  . KNEE SURGERY Left 2000    Family History  Problem Relation Age of Onset  . Prostate cancer Father   . Heart failure Mother   . Colon cancer Neg Hx   . Diabetes Neg Hx   . Kidney disease Neg Hx   . Liver disease Neg Hx    Social History   Occupational History  . Not on file  Tobacco Use  . Smoking status: Never Smoker  . Smokeless tobacco: Never Used  Substance and Sexual Activity  . Alcohol use: Yes    Alcohol/week: 1.0 standard drinks    Types: 1 drink(s) per week  . Drug use: No  . Sexual activity: Not on file    Tobacco Counseling Counseling given: Not Answered not applicab.e  Immunizations and Health Maintenance  There is no immunization history on file for this patient. Health Maintenance Due  Topic Date Due  . TETANUS/TDAP  01/27/1984  . PAP SMEAR  07/31/2015    Activities of Daily Living In your present state of health, do you have any difficulty performing the following activities: 12/31/2017 12/22/2017  Hearing? N N  Vision? Y N  Difficulty concentrating or making decisions? Y N  Walking or climbing stairs? Y Y  Dressing or bathing? N N  Doing errands, shopping? Y -  Conservation officer, nature and eating ? N -  Using the Toilet? N -  In the past six months, have you accidently leaked urine? Y -  Do you have problems with loss of bowel control? N -  Managing your Medications? N -  Managing your Finances? N -  Housekeeping or managing your Housekeeping? N -  Some recent data might be hidden    Physical Exam    BP 108/66 (BP Location: Left Arm, Patient Position: Sitting, Cuff Size: Normal)   Pulse 90   Temp 98.2 F (36.8 C) (Oral)   Ht 5' 4.5" (1.638 m)   Wt 261 lb 6.4 oz (118.6 kg)   BMI  44.18 kg/m   General Appearance:    Alert, cooperative, no distress, appears stated age  Head:    Normocephalic, without obvious abnormality, atraumatic  Eyes:    PERRL, conjunctiva/corneas clear, EOM's intact, fundi    benign, both eyes  Ears:    Normal TM's and external ear canals, both ears  Nose:   Nares normal, septum midline, mucosa normal, no drainage    or sinus tenderness  Throat:   Lips, mucosa, and tongue normal; teeth and gums normal  Neck:   Supple, symmetrical,  trachea midline, no adenopathy;    thyroid:  no enlargement/tenderness/nodules; no carotid   bruit or JVD  Back:     Symmetric, no curvature, ROM normal, no CVA tenderness  Lungs:     Clear to auscultation bilaterally, respirations unlabored  Chest Wall:    No tenderness or deformity   Heart:    Regular rate and rhythm, S1 and S2 normal, no murmur, rub   or gallop  Breast Exam:    No tenderness, masses, or nipple abnormality  Abdomen:     Soft, obese, non-tender, bowel sounds active all four quadrants,  no masses, no organomegaly Difficult to assess organomegaly due to body habitus. Healed surgical scars  Genitalia:   Deferred     Extremities:   Extremities normal, atraumatic, no cyanosis or edema  Pulses:   2+ and symmetric all extremities  Skin:   Skin color, texture, turgor normal, no rashes or lesions  Lymph nodes:   Cervical, supraclavicular, and axillary nodes normal  Neurologic:   CNII-XII intact, normal strength, sensation and reflexes    throughout     Advanced Directives:      Assessment:    This is a routine wellness examination for this patient  Vision/Hearing screen  Hearing Screening   '125Hz'  '250Hz'  '500Hz'  '1000Hz'  '2000Hz'  '3000Hz'  '4000Hz'  '6000Hz'  '8000Hz'   Right ear:   '25 25 25  25    ' Left ear:   '25 25 25  25      ' Visual Acuity Screening   Right eye Left eye Both eyes  Without correction:     With correction: '20/20 20/30 20/20 '    Dietary issues and exercise activities discussed:  Current  Exercise Habits: The patient does not participate in regular exercise at present, Exercise limited by: orthopedic condition(s);neurologic condition(s)  Goals    . Exercise 3x per week (30 min per time)    . Weight (lb) < 200 lb (90.7 kg)     Our initial goal is to achieve BMI less than 39.       Depression Screen PHQ 2/9 Scores 12/31/2017  PHQ - 2 Score 6  PHQ- 9 Score 15     Fall Risk Fall Risk  12/31/2017  Falls in the past year? 0    Cognitive Function:MMSE PERFORMED.        Patient Care Team: Glendale Chard, MD as PCP - General (Internal Medicine) Dalton-Bethea, Fabio Asa, MD as Consulting Physician (Physical Medicine and Rehabilitation) Mohammed Kindle, MD as Consulting Physician (Pain Medicine)     Plan:    1. Routine general medical examination at health care facility  A full exam was performed. THE WELCOME TO MEDICARE, ANNUAL WELLNESS VISIT WAS PERFORMED INCLUDING DISCUSSION OF ADVANCED DIRECTIVES, ASSESSMENT OF COGNITIVE FUNCTION AND COMPLETION OF GERIATRIC FUNCTIONAL ASSESSMENT FORM. PATIENT HAS BEEN ADVISED TO GET 30-45 MINUTES REGULAR EXERCISE NO LESS THAN FOUR TO FIVE DAYS PER WEEK - BOTH WEIGHTBEARING EXERCISES AND AEROBIC ARE RECOMMENDED.  SHE IS ADVISED TO FOLLOW A HEALTHY DIET WITH AT LEAST SIX FRUITS/VEGGIES PER DAY, DECREASE INTAKE OF RED MEAT, AND TO INCREASE FISH INTAKE TO TWO DAYS PER WEEK.  MEATS/FISH SHOULD NOT BE FRIED, BAKED OR BROILED IS PREFERABLE.  I SUGGEST WEARING SPF 50 SUNSCREEN ON EXPOSED PARTS AND ESPECIALLY WHEN IN THE DIRECT SUNLIGHT FOR AN EXTENDED PERIOD OF TIME.  PLEASE AVOID FAST FOOD RESTAURANTS AND INCREASE YOUR WATER INTAKE.  - EKG 12-Lead - POCT Urinalysis Dipstick (81002)  2. Bilateral sciatica  Chronic. Previously followed by Dr. Niel Hummer.  She is now transferring her care to Dr. Primus Bravo. Dr. Ace Gins is no longer prescribing pain meds to her patients.   3. Class 3 severe obesity due to excess calories with serious comorbidity  and body mass index (BMI) of 40.0 to 44.9 in adult [facility])  Chronic. She is encouraged to strive for BMI less than 37 to decrease cardiac risk. She is encouraged to avoid sugary beverages including diet drinks, juices and sodas. Importance of regular exercise was also discussed with the patient. Unfortunately, this is limited due to chronic pain.   4. Moderate episode of recurrent major depressive disorder (HCC)  Chronic. I do think her sx are exacerbated by chronic pain. She has upcoming appt with Dr. Primus Bravo.   5. Primary hypothyroidism  I will check a thyroid panel and adjust meds as needed.  - TSH - T3, free - T4, Free - Lipid Profile - CBC no Diff - CMP14+EGFR  6. Drug therapy  - CMP14+EGFR  7. Need for immunization against influenza  She was given flu vaccine.   I have personally reviewed and noted the following in the patient's chart:   . Medical and social history . Use of alcohol, tobacco or illicit drugs  . Current medications and supplements . Functional ability and status . Nutritional status . Physical activity . Advanced directives . List of other physicians . Hospitalizations, surgeries, and ER visits in previous 12 months . Vitals . Screenings to include cognitive, depression, and falls . Referrals and appointments  In addition, I have reviewed and discussed with patient certain preventive protocols, quality metrics, and best practice recommendations. A written personalized care plan for preventive services as well as general preventive health recommendations were provided to patient.     [name], MD 12/31/2017

## 2018-01-01 LAB — TSH: TSH: 0.498 u[IU]/mL (ref 0.450–4.500)

## 2018-01-01 LAB — CMP14+EGFR
ALBUMIN: 3.4 g/dL — AB (ref 3.5–5.5)
ALT: 26 IU/L (ref 0–32)
AST: 23 IU/L (ref 0–40)
Albumin/Globulin Ratio: 1.1 — ABNORMAL LOW (ref 1.2–2.2)
Alkaline Phosphatase: 104 IU/L (ref 39–117)
BUN / CREAT RATIO: 10 (ref 9–23)
BUN: 10 mg/dL (ref 6–24)
CHLORIDE: 108 mmol/L — AB (ref 96–106)
CO2: 21 mmol/L (ref 20–29)
CREATININE: 1.02 mg/dL — AB (ref 0.57–1.00)
Calcium: 8.8 mg/dL (ref 8.7–10.2)
GFR calc non Af Amer: 63 mL/min/{1.73_m2} (ref >59)
GFR, EST AFRICAN AMERICAN: 73 mL/min/{1.73_m2} (ref >59)
GLUCOSE: 79 mg/dL (ref 65–99)
Globulin, Total: 3.2 g/dL (ref 1.5–4.5)
Potassium: 4.6 mmol/L (ref 3.5–5.2)
Sodium: 143 mmol/L (ref 134–144)
TOTAL PROTEIN: 6.6 g/dL (ref 6.0–8.5)

## 2018-01-01 LAB — LIPID PANEL
CHOLESTEROL TOTAL: 183 mg/dL (ref 100–199)
Chol/HDL Ratio: 2.7 ratio (ref 0.0–4.4)
HDL: 69 mg/dL (ref >39)
LDL CALC: 97 mg/dL (ref 0–99)
TRIGLYCERIDES: 83 mg/dL (ref 0–149)
VLDL Cholesterol Cal: 17 mg/dL (ref 5–40)

## 2018-01-01 LAB — CBC
HEMATOCRIT: 30.5 % — AB (ref 34.0–46.6)
HEMOGLOBIN: 9.7 g/dL — AB (ref 11.1–15.9)
MCH: 30.1 pg (ref 26.6–33.0)
MCHC: 31.8 g/dL (ref 31.5–35.7)
MCV: 95 fL (ref 79–97)
Platelets: 411 10*3/uL (ref 150–450)
RBC: 3.22 x10E6/uL — AB (ref 3.77–5.28)
RDW: 13.1 % (ref 12.3–15.4)
WBC: 5 10*3/uL (ref 3.4–10.8)

## 2018-01-01 LAB — T4, FREE: Free T4: 1.15 ng/dL (ref 0.82–1.77)

## 2018-01-01 LAB — T3, FREE: T3, Free: 2.7 pg/mL (ref 2.0–4.4)

## 2018-01-01 NOTE — Progress Notes (Signed)
Your thyroid fxn is nl. Your chol is great. Your blood count is low. It has dropped since your surgery. Are you having any abdominal pain? I would like to recheck this in four weeks. Kw-pls schedule lab visit in four weeks. Check cbc w/ diff, dx: anemia D64.9. Your liver and kidney fxn are stable.

## 2018-01-18 ENCOUNTER — Other Ambulatory Visit: Payer: Self-pay

## 2018-01-18 MED ORDER — BUPROPION HCL ER (XL) 150 MG PO TB24: 150.0000 mg | ORAL_TABLET | Freq: Every day | ORAL | 1 refills | Status: DC

## 2018-01-25 ENCOUNTER — Ambulatory Visit: Payer: Medicare Other | Admitting: Nurse Practitioner

## 2018-01-28 DIAGNOSIS — Z5181 Encounter for therapeutic drug level monitoring: Secondary | ICD-10-CM | POA: Diagnosis not present

## 2018-01-28 DIAGNOSIS — M545 Low back pain: Secondary | ICD-10-CM | POA: Diagnosis not present

## 2018-01-28 DIAGNOSIS — Z79891 Long term (current) use of opiate analgesic: Secondary | ICD-10-CM | POA: Diagnosis not present

## 2018-01-28 DIAGNOSIS — G894 Chronic pain syndrome: Secondary | ICD-10-CM | POA: Diagnosis not present

## 2018-02-08 ENCOUNTER — Other Ambulatory Visit: Payer: Medicare Other

## 2018-02-08 DIAGNOSIS — Z79891 Long term (current) use of opiate analgesic: Secondary | ICD-10-CM | POA: Diagnosis not present

## 2018-02-08 DIAGNOSIS — G894 Chronic pain syndrome: Secondary | ICD-10-CM | POA: Diagnosis not present

## 2018-02-08 DIAGNOSIS — M545 Low back pain: Secondary | ICD-10-CM | POA: Diagnosis not present

## 2018-02-08 DIAGNOSIS — Z5181 Encounter for therapeutic drug level monitoring: Secondary | ICD-10-CM | POA: Diagnosis not present

## 2018-02-08 DIAGNOSIS — D509 Iron deficiency anemia, unspecified: Secondary | ICD-10-CM

## 2018-02-08 LAB — CBC WITH DIFFERENTIAL/PLATELET
BASOS: 1 %
Basophils Absolute: 0 10*3/uL (ref 0.0–0.2)
EOS (ABSOLUTE): 0.1 10*3/uL (ref 0.0–0.4)
EOS: 2 %
HEMATOCRIT: 31.1 % — AB (ref 34.0–46.6)
HEMOGLOBIN: 10.1 g/dL — AB (ref 11.1–15.9)
IMMATURE GRANULOCYTES: 0 %
Immature Grans (Abs): 0 10*3/uL (ref 0.0–0.1)
Lymphocytes Absolute: 2.3 10*3/uL (ref 0.7–3.1)
Lymphs: 46 %
MCH: 30.8 pg (ref 26.6–33.0)
MCHC: 32.5 g/dL (ref 31.5–35.7)
MCV: 95 fL (ref 79–97)
Monocytes Absolute: 0.4 10*3/uL (ref 0.1–0.9)
Monocytes: 8 %
NEUTROS PCT: 43 %
Neutrophils Absolute: 2.1 10*3/uL (ref 1.4–7.0)
Platelets: 475 10*3/uL — ABNORMAL HIGH (ref 150–450)
RBC: 3.28 x10E6/uL — ABNORMAL LOW (ref 3.77–5.28)
RDW: 14.4 % (ref 12.3–15.4)
WBC: 5 10*3/uL (ref 3.4–10.8)

## 2018-02-09 ENCOUNTER — Encounter (INDEPENDENT_AMBULATORY_CARE_PROVIDER_SITE_OTHER): Payer: Self-pay | Admitting: Physical Medicine and Rehabilitation

## 2018-02-09 ENCOUNTER — Ambulatory Visit (INDEPENDENT_AMBULATORY_CARE_PROVIDER_SITE_OTHER): Payer: Medicare Other | Admitting: Physical Medicine and Rehabilitation

## 2018-02-09 VITALS — BP 111/78 | HR 94 | Ht 66.0 in | Wt 265.0 lb

## 2018-02-09 DIAGNOSIS — M461 Sacroiliitis, not elsewhere classified: Secondary | ICD-10-CM

## 2018-02-09 DIAGNOSIS — G8929 Other chronic pain: Secondary | ICD-10-CM

## 2018-02-09 DIAGNOSIS — M961 Postlaminectomy syndrome, not elsewhere classified: Secondary | ICD-10-CM

## 2018-02-09 DIAGNOSIS — G894 Chronic pain syndrome: Secondary | ICD-10-CM | POA: Diagnosis not present

## 2018-02-09 DIAGNOSIS — F1190 Opioid use, unspecified, uncomplicated: Secondary | ICD-10-CM

## 2018-02-09 DIAGNOSIS — M5442 Lumbago with sciatica, left side: Secondary | ICD-10-CM | POA: Diagnosis not present

## 2018-02-09 DIAGNOSIS — M5441 Lumbago with sciatica, right side: Secondary | ICD-10-CM

## 2018-02-09 NOTE — Progress Notes (Signed)
 .  Numeric Pain Rating Scale and Functional Assessment Average Pain 10 Pain Right Now 2 My pain is intermittent, stabbing and tingling Pain is worse with: walking and standing Pain improves with: medication and TENS   In the last MONTH (on 0-10 scale) has pain interfered with the following?  1. General activity like being  able to carry out your everyday physical activities such as walking, climbing stairs, carrying groceries, or moving a chair?  Rating(6)  2. Relation with others like being able to carry out your usual social activities and roles such as  activities at home, at work and in your community. Rating(6)  3. Enjoyment of life such that you have  been bothered by emotional problems such as feeling anxious, depressed or irritable?  Rating(5)

## 2018-02-10 NOTE — Progress Notes (Signed)
Your blood count has slightly improved. Your platelets are now elevated, this is likely related to your anemia. We will continue to follow this.   Happy new year to you and your family!  Sincerely,    Jocelynn Gioffre N. Allyne Gee, MD

## 2018-02-11 ENCOUNTER — Encounter (INDEPENDENT_AMBULATORY_CARE_PROVIDER_SITE_OTHER): Payer: Self-pay | Admitting: Physical Medicine and Rehabilitation

## 2018-02-11 DIAGNOSIS — M961 Postlaminectomy syndrome, not elsewhere classified: Secondary | ICD-10-CM | POA: Insufficient documentation

## 2018-02-11 DIAGNOSIS — G894 Chronic pain syndrome: Secondary | ICD-10-CM | POA: Insufficient documentation

## 2018-02-11 DIAGNOSIS — F1190 Opioid use, unspecified, uncomplicated: Secondary | ICD-10-CM | POA: Insufficient documentation

## 2018-02-11 NOTE — Progress Notes (Signed)
Natalie Guerrero - 53 y.o. female MRN [id number]  Date of birth: 1965/06/15  Office Visit Note: Visit Date: 02/09/2018 PCP: Dorothyann PengSanders, Robyn, MD Referred by: Dorothyann PengSanders, Robyn, MD  Subjective: Chief Complaint  Patient presents with  . Lower Back - Pain  . Right Leg - Pain, Numbness, Tingling  . Left Leg - Tingling, Pain, Numbness  . Right Foot - Tingling, Numbness, Pain  . Left Foot - Numbness, Pain, Tingling   HPI: Natalie Guerrero is a 53 y.o. female who comes in today For evaluation and management and possible radiofrequency ablation of the bilateral sacroiliac joints as requested by Dr. Herminio HeadsShawn Dalton-Bethea.  I have actually seen Mrs. Laural Guerrero in the remote past and had completed epidural injection on 2 occasions many years ago.  She then was followed by Dr. Estill BambergMark Dumonski and Dr. Claria DiceHao Wang at [facility].  While under the care of Dr. Regino SchultzeWang she had radiofrequency ablation of the sacroiliac joints and did well with 3 to 6 months of relief with each procedure.  We actually do have those notes to review on our other electronic medical record.  She has a history of lumbar fusion at L5-S1.  She has an MRI from 2007 showing no real adjacent level disease above the fusion and no stenosis.  Her pain complaints are axial low back pain of the bilateral PSIS area right along the sacroiliac joint with some referral into the thighs.  She occasionally gets symptoms that seem to refer even past the knee bilaterally.  She has not noted any focal weakness.  She does get tingling sensation at times.  She has difficulty going from sit to stand and difficulty with standing and walking at times.  She was also treated more recently by Dr. Nickola Majoralton-Bethea and it sounds like radiofrequency ablation was also performed with good relief.  I do not have that particular note to review in terms of the procedure itself.  This was evidently done as a Facilities managerWorker's Compensation claim.  The patient now has Medicare and Dr.  Nickola Majoralton-Bethea does not take Medicare and so the patient was referred here.  She has not noted any red flag complaints of new trauma or bowel or bladder changes or focal weakness.  She has had multiple bouts of physical therapy in the past.  She reports seeing a massage therapist who actually relieves the symptoms temporarily.  The massage therapist thought she may have piriformis syndrome.  The patient has a lot of lower quarter or lower half obesity.  Obesity through the hips and buttock region out of proportion to her upper body.  She does currently take hydrocodone and this is followed and managed by Dr. Metta Clinesrisp.  Dr. Metta Clinesrisp is a pain management anesthesiologist who no longer is performing procedures but is doing a good job locally with management of opioid treatment and pain management.  Review of Systems  Constitutional: Negative for chills, fever, malaise/fatigue and weight loss.  HENT: Negative for hearing loss and sinus pain.   Eyes: Negative for blurred vision, double vision and photophobia.  Respiratory: Negative for cough and shortness of breath.   Cardiovascular: Negative for chest pain, palpitations and leg swelling.  Gastrointestinal: Negative for abdominal pain, nausea and vomiting.  Genitourinary: Negative for flank pain.  Musculoskeletal: Positive for back pain and joint pain. Negative for myalgias.  Skin: Negative for itching and rash.  Neurological: Positive for tingling. Negative for tremors, focal weakness and weakness.  Endo/Heme/Allergies: Negative.   Psychiatric/Behavioral: Negative for depression.  All other systems reviewed and are negative.  Otherwise per HPI.  Assessment & Plan: Visit Diagnoses:  1. Sacroiliitis (HCC)   2. Chronic bilateral low back pain with bilateral sciatica   3. Post laminectomy syndrome   4. Chronic pain syndrome   5. Opioid use, unspecified, uncomplicated     Plan: Findings:  Chronic recalcitrant chronic pain syndrome and low back pain and  bilateral buttock and hip pain with some referral pattern in the hip and leg consistent with either sacroiliac joint referral pattern or possibly piriformis syndrome with sciatic nerve irritation.  MRI evidence of no nerve compression no radicular type issue.  Could had some level of peripheral neuropathy although no history of diabetes.  Case is complicated by opioid treatment managed by Dr. crisp.  She has had good relief with prior ablations of the sacroiliac joints bilaterally by 2 different physicians Dr. both a-Dalton and Dr. Regino SchultzeWang.  I think the best approach at this point is to [facility] for piriformis work and dry needling.  I think if that gives her a lot of relief that may be a good way to go at this point along with current treatment.  If that just does not give her much relief then I would try to get approval for sacroiliac joint denervation.  Her supplemental carrier [employer] will tend to look that this is experimental but there is clearly evidence showing relief with ablations and a good bit of the population that receive them.  She also has prior history of this already taken place and getting 3 to 6 months of relief.  She has no other nerve compression no reason for epidurals or other issues.  She could be a spinal cord stimulator candidate but she does not like the idea of a foreign body such as the battery in her system or implanted.  She has had other doctors talked to her about stimulation in the past.    Meds & Orders: No orders of the defined types were placed in this encounter.   Orders Placed This Encounter  Procedures  . Ambulatory referral to Physical Therapy    Follow-up: No follow-ups on file.   Procedures: No procedures performed  No notes on file   Clinical History: MRI of the lumbar spine without and with intravenous contrast performed on 06/01/2015 11:45 AM.  COMPARISON: MRI dated 01/05/2013 and CT performed on  12/16/2013.Marland Kitchen.  TECHNIQUE: Multiplanar, multisequence MR imaging obtained through the lumbar spine without and with contrast on 06/01/2015 11:45 AM.  Contrast: 10 mL of Gadavist was utilized for this examination.  FINDINGS: #Osseous structures: Status post bilateral pedicle screws and hardware fixation with interbody spacer at L5-S1. Visualized marrow signal of vertebral body heights are unremarkable. No evidence of an acute fracture. #Alignment:Alignment maintained. #Conus medullaris/cauda equina: Spinal cord signal is normal and terminates at L1. The cauda equina is unremarkable. No abnormal enhancements.  #Lower thoracic spine: Unremarkable as viewed on the sagittal images only.  #T12-L1: Unremarkable as viewed on the sagittal images only. #L1-L2: Unremarkable as viewed on the sagittal images only. #L2-L3: Mild facet arthrosis. No significant disc herniation, spinal canal or neural foraminal compromise. This is unchanged. #L3-L4: Mild facet hypertrophy without any significant disc herniation, spinal canal or neural foraminal compromise, unchanged. #L4-L5: Mild facet hypertrophy. No significant disc herniation, spinal canal or neural foraminal compromise. This is unchanged. #L5-S1: Postoperative changes. Mild facet arthrosis. Minimal narrowing of the left neural foramen. Spinal canal and right neural  foramina patent.  #Paraspinal tissues: Unremarkable.  #Additional comments: No abnormal enhancement.   IMPRESSION: 1.Status post operative changes at L5-S1. Minimal narrowing of the left neural foramen. 2.Mild facet arthrosis without any significant spinal canal or neural foraminal compromise.  Electronically Signed by Ardyth Man   She reports that she has never smoked. She has never used smokeless tobacco.  Recent Labs    02/25/17  HGBA1C 4.9    Objective:  VS:  HT:5\' 6"  (167.6 cm)   WT:265 lb (120.2 kg)  BMI:42.79    BP:111/78  HR:94bpm  TEMP: ( )   RESP:100 % Physical Exam Vitals signs and nursing note reviewed.  Constitutional:      General: She is not in acute distress.    Appearance: Normal appearance. She is well-developed. She is obese.     Comments: Mainly out of proportion lower half obesity in the hips and buttocks and legs.  HENT:     Head: Normocephalic and atraumatic.     Nose: Nose normal.     Mouth/Throat:     Mouth: Mucous membranes are moist.     Pharynx: Oropharynx is clear.  Eyes:     Conjunctiva/sclera: Conjunctivae normal.     Pupils: Pupils are equal, round, and reactive to light.  Neck:     Musculoskeletal: Normal range of motion and neck supple.  Cardiovascular:     Rate and Rhythm: Regular rhythm.  Pulmonary:     Effort: Pulmonary effort is normal. No respiratory distress.  Abdominal:     General: There is no distension.     Palpations: Abdomen is soft.     Tenderness: There is no guarding.  Musculoskeletal:     Right lower leg: Edema present.     Left lower leg: Edema present.     Comments: Patient has difficulty going from sit to stand and to stands with forward flexion and has pain with facet loading and extension.  She has no pain over the paraspinal muscles.  She does have pain over the bilateral PSIS and piriformis.  She has no pain over the greater trochanters she has no pain with hip rotation other than it does cause some pain over the greater trochanter area with internal rotation.  No groin pain.  She has good distal strength without clonus.  Skin:    General: Skin is warm and dry.     Findings: No erythema or rash.  Neurological:     General: No focal deficit present.     Mental Status: She is alert and oriented to person, place, and time.     Motor: No abnormal muscle tone.     Coordination: Coordination normal.     Gait: Gait normal.  Psychiatric:        Mood and Affect: Mood normal.        Behavior: Behavior normal.        Thought Content: Thought content normal.     Ortho  Exam Imaging: No results found.  Past Medical/Family/Surgical/Social History: Medications & Allergies reviewed per EMR, new medications updated. Patient Active Problem List   Diagnosis Date Noted  . Post laminectomy syndrome 02/11/2018  . Chronic pain syndrome 02/11/2018  . Opioid use, unspecified, uncomplicated 02/11/2018  . Iron malabsorption 01/20/2017  . Fatigue 12/08/2012  . Hypothyroidism 12/08/2012  . Iron deficiency anemia in a patient s/p gastric bypass 11/10/2012   Past Medical History:  Diagnosis Date  . Anxiety   . Arthritis   . DDD (degenerative disc disease)   .  Depression   . Hypothyroidism 12/08/2012  . Iron deficiency anemia, unspecified 11/10/2012  . Iron malabsorption 01/20/2017  . Obesity   . Thyroid disease hypothroidism   Family History  Problem Relation Age of Onset  . Prostate cancer Father   . Heart failure Mother   . Colon cancer Neg Hx   . Diabetes Neg Hx   . Kidney disease Neg Hx   . Liver disease Neg Hx    Past Surgical History:  Procedure Laterality Date  . ABDOMINAL HYSTERECTOMY  1999  . BACK SURGERY    . CESAREAN SECTION  1991  . CHOLECYSTECTOMY N/A 12/22/2017   Procedure: LAPAROSCOPIC CHOLECYSTECTOMY;  Surgeon: Axel Filler, MD;  Location: [facility] OR;  Service: General;  Laterality: N/A;  . GASTRIC BYPASS  2007  . KNEE SURGERY Left 2000   Social History   Occupational History  . Not on file  Tobacco Use  . Smoking status: Never Smoker  . Smokeless tobacco: Never Used  Substance and Sexual Activity  . Alcohol use: Yes    Alcohol/week: 1.0 standard drinks    Types: 1 drink(s) per week  . Drug use: No  . Sexual activity: Not on file

## 2018-02-19 ENCOUNTER — Encounter: Payer: Self-pay | Admitting: Internal Medicine

## 2018-03-08 DIAGNOSIS — Z79891 Long term (current) use of opiate analgesic: Secondary | ICD-10-CM | POA: Diagnosis not present

## 2018-03-08 DIAGNOSIS — Z5181 Encounter for therapeutic drug level monitoring: Secondary | ICD-10-CM | POA: Diagnosis not present

## 2018-03-08 DIAGNOSIS — G894 Chronic pain syndrome: Secondary | ICD-10-CM | POA: Diagnosis not present

## 2018-03-08 DIAGNOSIS — M545 Low back pain: Secondary | ICD-10-CM | POA: Diagnosis not present

## 2018-03-24 ENCOUNTER — Encounter: Payer: Self-pay | Admitting: Internal Medicine

## 2018-04-05 DIAGNOSIS — G894 Chronic pain syndrome: Secondary | ICD-10-CM | POA: Diagnosis not present

## 2018-04-05 DIAGNOSIS — M47897 Other spondylosis, lumbosacral region: Secondary | ICD-10-CM | POA: Diagnosis not present

## 2018-04-05 DIAGNOSIS — M48062 Spinal stenosis, lumbar region with neurogenic claudication: Secondary | ICD-10-CM | POA: Diagnosis not present

## 2018-04-05 DIAGNOSIS — Z79891 Long term (current) use of opiate analgesic: Secondary | ICD-10-CM | POA: Diagnosis not present

## 2018-04-05 DIAGNOSIS — M792 Neuralgia and neuritis, unspecified: Secondary | ICD-10-CM | POA: Diagnosis not present

## 2018-04-05 DIAGNOSIS — G8929 Other chronic pain: Secondary | ICD-10-CM | POA: Diagnosis not present

## 2018-04-05 DIAGNOSIS — I82409 Acute embolism and thrombosis of unspecified deep veins of unspecified lower extremity: Secondary | ICD-10-CM | POA: Diagnosis not present

## 2018-05-03 DIAGNOSIS — Z79891 Long term (current) use of opiate analgesic: Secondary | ICD-10-CM | POA: Diagnosis not present

## 2018-05-03 DIAGNOSIS — Z5181 Encounter for therapeutic drug level monitoring: Secondary | ICD-10-CM | POA: Diagnosis not present

## 2018-05-03 DIAGNOSIS — G894 Chronic pain syndrome: Secondary | ICD-10-CM | POA: Diagnosis not present

## 2018-05-03 DIAGNOSIS — M545 Low back pain: Secondary | ICD-10-CM | POA: Diagnosis not present

## 2018-05-31 DIAGNOSIS — G894 Chronic pain syndrome: Secondary | ICD-10-CM | POA: Diagnosis not present

## 2018-06-08 ENCOUNTER — Other Ambulatory Visit: Payer: Self-pay | Admitting: Internal Medicine

## 2018-06-28 ENCOUNTER — Other Ambulatory Visit: Payer: Self-pay | Admitting: Obstetrics and Gynecology

## 2018-06-28 ENCOUNTER — Other Ambulatory Visit (HOSPITAL_COMMUNITY)
Admission: RE | Admit: 2018-06-28 | Discharge: 2018-06-28 | Disposition: A | Discharge status: Home / Self Care | Payer: Medicare Other | Source: Ambulatory Visit | Attending: Obstetrics and Gynecology | Admitting: Obstetrics and Gynecology

## 2018-06-28 DIAGNOSIS — R87622 Low grade squamous intraepithelial lesion on cytologic smear of vagina (LGSIL): Secondary | ICD-10-CM | POA: Insufficient documentation

## 2018-06-28 DIAGNOSIS — B977 Papillomavirus as the cause of diseases classified elsewhere: Secondary | ICD-10-CM | POA: Insufficient documentation

## 2018-06-28 DIAGNOSIS — G894 Chronic pain syndrome: Secondary | ICD-10-CM | POA: Diagnosis not present

## 2018-06-28 DIAGNOSIS — M792 Neuralgia and neuritis, unspecified: Secondary | ICD-10-CM | POA: Diagnosis not present

## 2018-06-28 DIAGNOSIS — R1032 Left lower quadrant pain: Secondary | ICD-10-CM | POA: Diagnosis not present

## 2018-06-28 DIAGNOSIS — M545 Low back pain: Secondary | ICD-10-CM | POA: Diagnosis not present

## 2018-06-28 DIAGNOSIS — M4807 Spinal stenosis, lumbosacral region: Secondary | ICD-10-CM | POA: Diagnosis not present

## 2018-06-28 DIAGNOSIS — R3915 Urgency of urination: Secondary | ICD-10-CM | POA: Diagnosis not present

## 2018-06-28 DIAGNOSIS — Z79891 Long term (current) use of opiate analgesic: Secondary | ICD-10-CM | POA: Diagnosis not present

## 2018-06-28 DIAGNOSIS — M47897 Other spondylosis, lumbosacral region: Secondary | ICD-10-CM | POA: Diagnosis not present

## 2018-06-28 DIAGNOSIS — Z5181 Encounter for therapeutic drug level monitoring: Secondary | ICD-10-CM | POA: Diagnosis not present

## 2018-06-28 DIAGNOSIS — G8929 Other chronic pain: Secondary | ICD-10-CM | POA: Diagnosis not present

## 2018-06-28 DIAGNOSIS — M48062 Spinal stenosis, lumbar region with neurogenic claudication: Secondary | ICD-10-CM | POA: Diagnosis not present

## 2018-07-01 ENCOUNTER — Ambulatory Visit (INDEPENDENT_AMBULATORY_CARE_PROVIDER_SITE_OTHER): Payer: Medicare Other | Admitting: Internal Medicine

## 2018-07-01 ENCOUNTER — Other Ambulatory Visit: Payer: Self-pay

## 2018-07-01 ENCOUNTER — Encounter: Payer: Self-pay | Admitting: Internal Medicine

## 2018-07-01 VITALS — BP 112/66 | HR 88 | Temp 98.5°F | Ht 64.6 in | Wt 264.0 lb

## 2018-07-01 DIAGNOSIS — K909 Intestinal malabsorption, unspecified: Secondary | ICD-10-CM | POA: Diagnosis not present

## 2018-07-01 DIAGNOSIS — Z6841 Body Mass Index (BMI) 40.0 and over, adult: Secondary | ICD-10-CM

## 2018-07-01 DIAGNOSIS — Z23 Encounter for immunization: Secondary | ICD-10-CM

## 2018-07-01 DIAGNOSIS — G894 Chronic pain syndrome: Secondary | ICD-10-CM

## 2018-07-01 DIAGNOSIS — Z0000 Encounter for general adult medical examination without abnormal findings: Secondary | ICD-10-CM | POA: Diagnosis not present

## 2018-07-01 DIAGNOSIS — E039 Hypothyroidism, unspecified: Secondary | ICD-10-CM | POA: Diagnosis not present

## 2018-07-01 LAB — POCT URINALYSIS DIPSTICK
Bilirubin, UA: NEGATIVE
Blood, UA: NEGATIVE
Glucose, UA: NEGATIVE
Ketones, UA: NEGATIVE
Leukocytes, UA: NEGATIVE
Nitrite, UA: NEGATIVE
Protein, UA: NEGATIVE
Spec Grav, UA: 1.015 (ref 1.010–1.025)
Urobilinogen, UA: 0.2 E.U./dL
pH, UA: 7 (ref 5.0–8.0)

## 2018-07-01 LAB — CYTOLOGY - PAP
Diagnosis: NEGATIVE
HPV: NOT DETECTED

## 2018-07-01 MED ORDER — TETANUS-DIPHTH-ACELL PERTUSSIS 5-2.5-18.5 LF-MCG/0.5 IM SUSP: 0.5000 mL | Freq: Once | INTRAMUSCULAR | 0 refills | Status: AC

## 2018-07-01 MED ORDER — BUPROPION HCL ER (XL) 150 MG PO TB24: 150.0000 mg | ORAL_TABLET | Freq: Every day | ORAL | 1 refills | Status: DC

## 2018-07-02 LAB — CMP14+EGFR
ALT: 16 IU/L (ref 0–32)
AST: 20 IU/L (ref 0–40)
Albumin/Globulin Ratio: 1.6 (ref 1.2–2.2)
Albumin: 4 g/dL (ref 3.8–4.9)
Alkaline Phosphatase: 88 IU/L (ref 39–117)
BUN/Creatinine Ratio: 16 (ref 9–23)
BUN: 15 mg/dL (ref 6–24)
Bilirubin Total: 0.3 mg/dL (ref 0.0–1.2)
CO2: 21 mmol/L (ref 20–29)
Calcium: 8.5 mg/dL — ABNORMAL LOW (ref 8.7–10.2)
Chloride: 103 mmol/L (ref 96–106)
Creatinine, Ser: 0.94 mg/dL (ref 0.57–1.00)
GFR calc Af Amer: 80 mL/min/{1.73_m2} (ref >59)
GFR calc non Af Amer: 69 mL/min/{1.73_m2} (ref >59)
Globulin, Total: 2.5 g/dL (ref 1.5–4.5)
Glucose: 81 mg/dL (ref 65–99)
Potassium: 4.3 mmol/L (ref 3.5–5.2)
Sodium: 136 mmol/L (ref 134–144)
Total Protein: 6.5 g/dL (ref 6.0–8.5)

## 2018-07-02 LAB — CBC
Hematocrit: 29.4 % — ABNORMAL LOW (ref 34.0–46.6)
Hemoglobin: 9.4 g/dL — ABNORMAL LOW (ref 11.1–15.9)
MCH: 30.1 pg (ref 26.6–33.0)
MCHC: 32 g/dL (ref 31.5–35.7)
MCV: 94 fL (ref 79–97)
Platelets: 403 10*3/uL (ref 150–450)
RBC: 3.12 x10E6/uL — ABNORMAL LOW (ref 3.77–5.28)
RDW: 14 % (ref 11.7–15.4)
WBC: 4.6 10*3/uL (ref 3.4–10.8)

## 2018-07-02 LAB — FERRITIN: Ferritin: 8 ng/mL — ABNORMAL LOW (ref 15–150)

## 2018-07-02 LAB — IRON AND TIBC
Iron Saturation: 14 % — ABNORMAL LOW (ref 15–55)
Iron: 45 ug/dL (ref 27–159)
Total Iron Binding Capacity: 317 ug/dL (ref 250–450)
UIBC: 272 ug/dL (ref 131–425)

## 2018-07-02 LAB — LIPID PANEL
Chol/HDL Ratio: 2.4 ratio (ref 0.0–4.4)
Cholesterol, Total: 173 mg/dL (ref 100–199)
HDL: 71 mg/dL (ref >39)
LDL Calculated: 89 mg/dL (ref 0–99)
Triglycerides: 66 mg/dL (ref 0–149)
VLDL Cholesterol Cal: 13 mg/dL (ref 5–40)

## 2018-07-02 LAB — TSH: TSH: 0.251 u[IU]/mL — ABNORMAL LOW (ref 0.450–4.500)

## 2018-07-02 LAB — HEMOGLOBIN A1C
Est. average glucose Bld gHb Est-mCnc: 97 mg/dL
Hgb A1c MFr Bld: 5 % (ref 4.8–5.6)

## 2018-07-02 LAB — VITAMIN B12: Vitamin B-12: >2000 pg/mL — ABNORMAL HIGH (ref 232–1245)

## 2018-07-02 LAB — T4, FREE: Free T4: 1.39 ng/dL (ref 0.82–1.77)

## 2018-07-03 NOTE — Patient Instructions (Signed)

## 2018-07-03 NOTE — Progress Notes (Signed)
Subjective:     Patient ID: Natalie Guerrero , female    DOB: 1965/08/27 , 53 y.o.   MRN: [id number]   Chief Complaint  Patient presents with  . Annual Exam    HPI  She is here today for a full physical examination.  She reports she is now followed by Dr. Primus Bravo for chronic pain. She is followed by GYN for her pelvic examinations.     Past Medical History:  Diagnosis Date  . Anxiety   . Arthritis   . DDD (degenerative disc disease)   . Depression   . Hypothyroidism 12/08/2012  . Iron deficiency anemia, unspecified 11/10/2012  . Iron malabsorption 01/20/2017  . Obesity   . Thyroid disease hypothroidism     Family History  Problem Relation Age of Onset  . Prostate cancer Father   . Heart failure Mother   . Colon cancer Neg Hx   . Diabetes Neg Hx   . Kidney disease Neg Hx   . Liver disease Neg Hx      Current Outpatient Medications:  .  Biotin 10 MG CAPS, Take 30 mg by mouth daily. , Disp: , Rfl:  .  buPROPion (WELLBUTRIN XL) 150 MG 24 hr tablet, Take 1 tablet (150 mg total) by mouth daily., Disp: 90 tablet, Rfl: 1 .  Carboxymethylcellulose Sodium (REFRESH LIQUIGEL) 1 % GEL, Place 1-2 drops into both eyes 4 (four) times daily as needed (for dry eyes)., Disp: , Rfl:  .  Cholecalciferol (VITAMIN D3) 2000 units TABS, Take 10,000 Units by mouth daily., Disp: , Rfl:  .  COLLAGEN PO, Take 2 Scoops by mouth daily., Disp: , Rfl:  .  Cyanocobalamin (VITAMIN B-12) 5000 MCG SUBL, Take 10,000 mcg by mouth daily., Disp: , Rfl:  .  cyclobenzaprine (FLEXERIL) 10 MG tablet, 1 tablet 4 times per day, Disp: , Rfl:  .  Dextromethorphan-guaiFENesin (MUCINEX DM MAXIMUM STRENGTH) 60-1200 MG TB12, Take 1 tablet by mouth 2 (two) times daily., Disp: , Rfl:  .  diclofenac (VOLTAREN) 75 MG EC tablet, Take 75 mg by mouth 2 (two) times daily., Disp: , Rfl:  .  diphenhydrAMINE (BENADRYL) 25 mg capsule, Take 50 mg by mouth 2 (two) times daily., Disp: , Rfl:  .  docusate sodium (COLACE) 100 MG  capsule, Take 200 mg by mouth 2 (two) times daily. , Disp: , Rfl:  .  FIBER PO, Take 4 capsules by mouth daily., Disp: , Rfl:  .  Glucosamine-Chondroit-Vit C-Mn (GLUCOSAMINE 1500 COMPLEX PO), Take 4 tablets by mouth daily., Disp: , Rfl:  .  imipramine (TOFRANIL) 25 MG tablet, Take 25 mg by mouth 3 (three) times daily., Disp: , Rfl: 4 .  iron polysaccharides (NIFEREX) 150 MG capsule, Take 150 mg by mouth daily., Disp: , Rfl:  .  levothyroxine (SYNTHROID) 100 MCG tablet, TAKE 1 TABLET (100 MCG TOTAL) BY MOUTH DAILY BEFORE BREAKFAST., Disp: 90 tablet, Rfl: 1 .  Lifitegrast (XIIDRA) 5 % SOLN, Place 1 drop into both eyes 2 (two) times daily., Disp: , Rfl:  .  Liniments (SALONPAS PAIN RELIEF PATCH EX), Apply 1 patch topically daily., Disp: , Rfl:  .  MAGNESIUM PO, Take 5 tablets by mouth daily. , Disp: , Rfl:  .  naproxen (NAPROSYN) 250 MG tablet, LIMIT 2 3 TABLETS TWICE PER DAY IF TOLERATED. LIMIT 5 TABLETS PER DAY. NO IBUPROFEN NO DICLOFENAC, Disp: , Rfl:  .  OVER THE COUNTER MEDICATION, Take 4 tablets by mouth daily. Liver Aid Supplement, Disp: ,  Rfl:  .  OVER THE COUNTER MEDICATION, Apply 1 application topically 2 (two) times daily. Beck and Blackley Arthritis Cream, Disp: , Rfl:  .  oxyCODONE-acetaminophen (PERCOCET) 10-325 MG tablet, , Disp: , Rfl:  .  Polyethyl Glycol-Propyl Glycol (SYSTANE ULTRA) 0.4-0.3 % SOLN, Place 2 drops into both eyes 2 (two) times daily as needed (for dry eyes)., Disp: , Rfl:  .  topiramate (TOPAMAX) 100 MG tablet, Take 100 mg by mouth 4 (four) times daily. , Disp: , Rfl: 5 .  Turmeric Curcumin 500 MG CAPS, Take 1,000 mg by mouth 2 (two) times daily., Disp: , Rfl:  .  ZINC-VITAMIN C PO, Take 6 tablets by mouth daily., Disp: , Rfl:    Allergies  Allergen Reactions  . Codeine Itching     Review of Systems  Constitutional: Negative.   HENT: Negative.   Eyes: Negative.   Respiratory: Negative.   Cardiovascular: Negative.   Gastrointestinal: Negative.   Endocrine:  Negative.   Genitourinary: Negative.   Musculoskeletal: Positive for arthralgias and back pain.       She has chronic pain. She is followed by pain mgmt  Skin: Negative.   Allergic/Immunologic: Negative.   Neurological: Negative.   Psychiatric/Behavioral: Negative.      Today's Vitals   07/01/18 0958  BP: 112/66  Pulse: 88  Temp: 98.5 F (36.9 C)  TempSrc: Oral  Weight: 264 lb (119.7 kg)  Height: 5' 4.6" (1.641 m)  PainSc: 0-No pain   Body mass index is 44.48 kg/m.   Objective:  Physical Exam Vitals signs and nursing note reviewed.  Constitutional:      Appearance: Normal appearance. She is obese.  HENT:     Head: Normocephalic and atraumatic.     Right Ear: Tympanic membrane, ear canal and external ear normal.     Left Ear: Tympanic membrane, ear canal and external ear normal.     Nose: Nose normal.     Mouth/Throat:     Mouth: Mucous membranes are moist.     Pharynx: Oropharynx is clear.  Eyes:     Extraocular Movements: Extraocular movements intact.     Conjunctiva/sclera: Conjunctivae normal.     Pupils: Pupils are equal, round, and reactive to light.  Neck:     Musculoskeletal: Normal range of motion and neck supple.  Cardiovascular:     Rate and Rhythm: Normal rate and regular rhythm.     Pulses: Normal pulses.     Heart sounds: Normal heart sounds.  Pulmonary:     Effort: Pulmonary effort is normal.     Breath sounds: Normal breath sounds.  Abdominal:     General: Bowel sounds are normal.     Palpations: Abdomen is soft.     Comments: Obese, difficult to assess organomegaly  Genitourinary:    Comments: deferred Musculoskeletal: Normal range of motion.     Comments: Crepitus b/l knees  Skin:    General: Skin is warm and dry.  Neurological:     General: No focal deficit present.     Mental Status: She is alert and oriented to person, place, and time.  Psychiatric:        Mood and Affect: Mood normal.        Behavior: Behavior normal.          Assessment And Plan:     1. Routine general medical examination at health care facility  A full exam was performed. Importance of monthly self breast exams was discussed with the  patient. PATIENT HAS BEEN ADVISED TO GET 30-45 MINUTES REGULAR EXERCISE NO LESS THAN FOUR TO FIVE DAYS PER WEEK - BOTH WEIGHTBEARING EXERCISES AND AEROBIC ARE RECOMMENDED.  HE/SHE WAS ADVISED TO FOLLOW A HEALTHY DIET WITH AT LEAST SIX FRUITS/VEGGIES PER DAY, DECREASE INTAKE OF RED MEAT, AND TO INCREASE FISH INTAKE TO TWO DAYS PER WEEK.  MEATS/FISH SHOULD NOT BE FRIED, BAKED OR BROILED IS PREFERABLE.  I SUGGEST WEARING SPF 50 SUNSCREEN ON EXPOSED PARTS AND ESPECIALLY WHEN IN THE DIRECT SUNLIGHT FOR AN EXTENDED PERIOD OF TIME.  PLEASE AVOID FAST FOOD RESTAURANTS AND INCREASE YOUR WATER INTAKE.  - POCT Urinalysis Dipstick (81002)  2. Hypothyroidism, unspecified type  I will check thyroid panel and adjust meds as needed.  - CMP14+EGFR - CBC - Lipid panel - Hemoglobin A1c - TSH - T4, Free  3. Chronic pain syndrome  Chronic. As per Dr. Primus Bravo.   4. Need for vaccination  Immunization history reviewed. A prescription for Tdap was sent to her pharmacy. Pt advised that they will administer the vaccine at the pharmacy.   5. Iron malabsorption  Chronic. I will check her iron levels today.   - Iron and IBC ([id number]) - Ferritin - Vitamin B12    6. Class 3 severe obesity due to excess calories with serious comorbidity and body mass index (BMI) of 40.0 to 44.9 in adult [facility])  Importance of achieving optimal weight to decrease risk of cardiovascular disease and cancers was discussed with the patient in full detail. She is encouraged to start slowly - start with 10 minutes twice daily at least three to four days per week and to gradually build to 30 minutes five days weekly. She was given tips to incorporate more activity into her daily routine - take stairs when possible, park farther away from grocery stores,  etc.     [name], MD    THE PATIENT IS ENCOURAGED TO PRACTICE SOCIAL DISTANCING DUE TO THE COVID-19 PANDEMIC.

## 2018-07-06 ENCOUNTER — Encounter: Payer: Self-pay | Admitting: Internal Medicine

## 2018-07-06 ENCOUNTER — Other Ambulatory Visit: Payer: Self-pay | Admitting: Internal Medicine

## 2018-07-06 DIAGNOSIS — D649 Anemia, unspecified: Secondary | ICD-10-CM

## 2018-07-23 ENCOUNTER — Other Ambulatory Visit: Payer: Self-pay | Admitting: Family

## 2018-07-23 DIAGNOSIS — K9049 Malabsorption due to intolerance, not elsewhere classified: Secondary | ICD-10-CM

## 2018-07-23 DIAGNOSIS — D508 Other iron deficiency anemias: Secondary | ICD-10-CM

## 2018-07-26 ENCOUNTER — Inpatient Hospital Stay: Payer: Medicare Other | Attending: Family | Admitting: Family

## 2018-07-26 ENCOUNTER — Telehealth: Payer: Self-pay | Admitting: Family

## 2018-07-26 ENCOUNTER — Other Ambulatory Visit: Payer: Self-pay

## 2018-07-26 ENCOUNTER — Inpatient Hospital Stay: Payer: Medicare Other

## 2018-07-26 VITALS — BP 129/84 | HR 95 | Resp 17

## 2018-07-26 VITALS — BP 126/78 | HR 81 | Temp 98.0°F | Resp 18 | Ht 64.5 in | Wt 271.1 lb

## 2018-07-26 DIAGNOSIS — D508 Other iron deficiency anemias: Secondary | ICD-10-CM | POA: Diagnosis not present

## 2018-07-26 DIAGNOSIS — K9049 Malabsorption due to intolerance, not elsewhere classified: Secondary | ICD-10-CM

## 2018-07-26 DIAGNOSIS — D509 Iron deficiency anemia, unspecified: Secondary | ICD-10-CM | POA: Insufficient documentation

## 2018-07-26 DIAGNOSIS — Z9884 Bariatric surgery status: Secondary | ICD-10-CM | POA: Diagnosis not present

## 2018-07-26 DIAGNOSIS — K912 Postsurgical malabsorption, not elsewhere classified: Secondary | ICD-10-CM | POA: Diagnosis not present

## 2018-07-26 DIAGNOSIS — K909 Intestinal malabsorption, unspecified: Secondary | ICD-10-CM | POA: Diagnosis not present

## 2018-07-26 DIAGNOSIS — Z79899 Other long term (current) drug therapy: Secondary | ICD-10-CM | POA: Insufficient documentation

## 2018-07-26 LAB — CBC WITH DIFFERENTIAL (CANCER CENTER ONLY)
Abs Immature Granulocytes: 0.02 10*3/uL (ref 0.00–0.07)
Basophils Absolute: 0 10*3/uL (ref 0.0–0.1)
Basophils Relative: 1 %
Eosinophils Absolute: 0.2 10*3/uL (ref 0.0–0.5)
Eosinophils Relative: 4 %
HCT: 30.3 % — ABNORMAL LOW (ref 36.0–46.0)
Hemoglobin: 9.1 g/dL — ABNORMAL LOW (ref 12.0–15.0)
Immature Granulocytes: 0 %
Lymphocytes Relative: 49 %
Lymphs Abs: 2.3 10*3/uL (ref 0.7–4.0)
MCH: 28.9 pg (ref 26.0–34.0)
MCHC: 30 g/dL (ref 30.0–36.0)
MCV: 96.2 fL (ref 80.0–100.0)
Monocytes Absolute: 0.4 10*3/uL (ref 0.1–1.0)
Monocytes Relative: 8 %
Neutro Abs: 1.8 10*3/uL (ref 1.7–7.7)
Neutrophils Relative %: 38 %
Platelet Count: 425 10*3/uL — ABNORMAL HIGH (ref 150–400)
RBC: 3.15 MIL/uL — ABNORMAL LOW (ref 3.87–5.11)
RDW: 14.1 % (ref 11.5–15.5)
WBC Count: 4.8 10*3/uL (ref 4.0–10.5)
nRBC: 0 % (ref 0.0–0.2)

## 2018-07-26 LAB — RETICULOCYTES
Immature Retic Fract: 18.5 % — ABNORMAL HIGH (ref 2.3–15.9)
RBC.: 3.14 MIL/uL — ABNORMAL LOW (ref 3.87–5.11)
Retic Count, Absolute: 54.6 10*3/uL (ref 19.0–186.0)
Retic Ct Pct: 1.7 % (ref 0.4–3.1)

## 2018-07-26 MED ORDER — SODIUM CHLORIDE 0.9 % IV SOLN
510.0000 mg | Freq: Once | INTRAVENOUS | Status: AC
  Administered 2018-07-26: 510 mg via INTRAVENOUS
  Filled 2018-07-26: qty 17

## 2018-07-26 NOTE — Progress Notes (Signed)
Hematology and Oncology Follow Up Visit  Natalie Guerrero [id number] Dec 24, 1965 53 y.o. 07/26/2018   Principle Diagnosis:  Iron deficiency anemia secondary to malabsorption after gastric bypass (2007)  Current Therapy:   IV iron as indicated    Interim History:  Natalie Guerrero is here today for follow-up. She is having some fatigue as well as SOB and palpitations with over exertion.  No episodes of bleeding. No bruising or petechiae.  No fever, chills, n/v, cough, rash, dizziness, chest pain, abdominal pain or changes in bowel or bladder habits.  She will take a stool softener for constipation when needed.  Due to lower back issues, she has numbness and tingling in her feet.  No episodes of bleeding, no bruising or petechiae.  No swelling or tenderness in her extremities at this time.  No lymphadenopathy noted on exam.  She has maintained a good appetite and is staying well hydrated. Her weight is stable.   ECOG Performance Status: 1 - Symptomatic but completely ambulatory  Medications:  Allergies as of 07/26/2018      Reactions   Codeine Itching      Medication List       Accurate as of July 26, 2018 11:51 AM. If you have any questions, ask your nurse or doctor.        Biotin 10 MG Caps Take 30 mg by mouth daily.   buPROPion 150 MG 24 hr tablet Commonly known as: WELLBUTRIN XL Take 1 tablet (150 mg total) by mouth daily.   COLLAGEN PO Take 2 Scoops by mouth daily.   cyclobenzaprine 10 MG tablet Commonly known as: FLEXERIL 1 tablet 4 times per day   diclofenac 75 MG EC tablet Commonly known as: VOLTAREN Take 75 mg by mouth 2 (two) times daily.   diphenhydrAMINE 25 mg capsule Commonly known as: BENADRYL Take 50 mg by mouth 2 (two) times daily.   docusate sodium 100 MG capsule Commonly known as: COLACE Take 200 mg by mouth 2 (two) times daily.   FIBER PO Take 4 capsules by mouth daily.   GLUCOSAMINE 1500 COMPLEX PO Take 4 tablets by mouth daily.    imipramine 25 MG tablet Commonly known as: TOFRANIL Take 25 mg by mouth 3 (three) times daily.   iron polysaccharides 150 MG capsule Commonly known as: NIFEREX Take 150 mg by mouth daily.   levothyroxine 100 MCG tablet Commonly known as: SYNTHROID TAKE 1 TABLET (100 MCG TOTAL) BY MOUTH DAILY BEFORE BREAKFAST.   MAGNESIUM PO Take 5 tablets by mouth daily.   Mucinex DM Maximum Strength 60-1200 MG Tb12 Take 1 tablet by mouth 2 (two) times daily.   naproxen 250 MG tablet Commonly known as: NAPROSYN LIMIT 2 3 TABLETS TWICE PER DAY IF TOLERATED. LIMIT 5 TABLETS PER DAY. NO IBUPROFEN NO DICLOFENAC   OVER THE COUNTER MEDICATION Take 4 tablets by mouth daily. Liver Aid Supplement   OVER THE COUNTER MEDICATION Apply 1 application topically 2 (two) times daily. Olevia Bowens and Blackley Arthritis Cream   oxyCODONE-acetaminophen 10-325 MG tablet Commonly known as: PERCOCET   Refresh Liquigel 1 % Gel Generic drug: Carboxymethylcellulose Sodium Place 1-2 drops into both eyes 4 (four) times daily as needed (for dry eyes).   SALONPAS PAIN RELIEF PATCH EX Apply 1 patch topically daily.   Systane Ultra 0.4-0.3 % Soln Generic drug: Polyethyl Glycol-Propyl Glycol Place 2 drops into both eyes 2 (two) times daily as needed (for dry eyes).   topiramate 100 MG tablet Commonly known as: TOPAMAX Take  100 mg by mouth 4 (four) times daily.   Turmeric Curcumin 500 MG Caps Take 1,000 mg by mouth 2 (two) times daily.   Vitamin B-12 5000 MCG Subl Take 10,000 mcg by mouth daily.   Vitamin D3 50 MCG (2000 UT) Tabs Take 10,000 Units by mouth daily.   Xiidra 5 % Soln Generic drug: Lifitegrast Place 1 drop into both eyes 2 (two) times daily.   ZINC-VITAMIN C PO Take 6 tablets by mouth daily.       Allergies:  Allergies  Allergen Reactions  . Codeine Itching    Past Medical History, Surgical history, Social history, and Family History were reviewed and updated.  Review of Systems: Guerrero  other 10 point review of systems is negative.   Physical Exam:  height is 5' 4.5" (1.638 m) and weight is 271 lb 1.9 oz (123 kg). Her oral temperature is 98 F (36.7 C). Her blood pressure is 126/78 and her pulse is 81. Her respiration is 18 and oxygen saturation is 100%.   Wt Readings from Last 3 Encounters:  07/26/18 271 lb 1.9 oz (123 kg)  07/01/18 264 lb (119.7 kg)  02/09/18 265 lb (120.2 kg)    Ocular: Sclerae unicteric, pupils equal, round and reactive to light Ear-nose-throat: Oropharynx clear, dentition fair Lymphatic: No cervical or supraclavicular adenopathy Lungs no rales or rhonchi, good excursion bilaterally Heart regular rate and rhythm, no murmur appreciated Abd soft, nontender, positive bowel sounds, no liver or spleen tip palpated on exam, no fluid wave  MSK no focal spinal tenderness, no joint edema Neuro: non-focal, well-oriented, appropriate affect Breasts: Deferred   Lab Results  Component Value Date   WBC 4.8 07/26/2018   HGB 9.1 (L) 07/26/2018   HCT 30.3 (L) 07/26/2018   MCV 96.2 07/26/2018   PLT 425 (H) 07/26/2018   Lab Results  Component Value Date   FERRITIN 8 (L) 07/01/2018   IRON 45 07/01/2018   TIBC 317 07/01/2018   UIBC 272 07/01/2018   IRONPCTSAT 14 (L) 07/01/2018   Lab Results  Component Value Date   RETICCTPCT 1.7 07/26/2018   RBC 3.15 (L) 07/26/2018   RETICCTABS 76.62 12/08/2012   No results found for: KPAFRELGTCHN, LAMBDASER, KAPLAMBRATIO No results found for: IGGSERUM, IGA, IGMSERUM No results found for: Georgann HousekeeperOTALPROTELP, ALBUMINELP, A1GS, A2GS, BETS, BETA2SER, GAMS, MSPIKE, SPEI   Chemistry      Component Value Date/Time   NA 136 07/01/2018 1106   NA 137 01/16/2017 1502   NA 141 11/10/2012 1050   K 4.3 07/01/2018 1106   K 3.7 01/16/2017 1502   K 3.8 11/10/2012 1050   CL 103 07/01/2018 1106   CL 98 01/16/2017 1502   CO2 21 07/01/2018 1106   CO2 25 01/16/2017 1502   CO2 28 11/10/2012 1050   BUN 15 07/01/2018 1106   BUN 14  01/16/2017 1502   BUN 7.5 11/10/2012 1050   CREATININE 0.94 07/01/2018 1106   CREATININE 1.1 01/16/2017 1502   CREATININE 0.8 11/10/2012 1050      Component Value Date/Time   CALCIUM 8.5 (L) 07/01/2018 1106   CALCIUM 8.8 01/16/2017 1502   CALCIUM 9.0 11/10/2012 1050   ALKPHOS 88 07/01/2018 1106   ALKPHOS 84 01/16/2017 1502   ALKPHOS 66 11/10/2012 1050   AST 20 07/01/2018 1106   AST 27 01/16/2017 1502   AST 34 11/10/2012 1050   ALT 16 07/01/2018 1106   ALT 23 01/16/2017 1502   ALT 30 11/10/2012 1050   BILITOT 0.3  07/01/2018 1106   BILITOT 0.60 01/16/2017 1502   BILITOT 0.57 11/10/2012 1050       Impression and Plan: Natalie Guerrero is a very pleasant 53 yo African American female with iron deficiency anemia secondary to malabsorption since having her gastric bypass surgery in 2007. Her ferritin is 8 and iron saturation 14%. We will give her IV iron today and again next week.  Follow-up will be in 3 months.  She will contact our office with any questions or concerns. We can certainly see her sooner if need be.   Natalie GinsSarah Akaya Proffit, NP 6/15/202011:51 AM

## 2018-07-26 NOTE — Telephone Encounter (Signed)
Appointments scheduled calendar/avs printed per 6/15 los °

## 2018-07-26 NOTE — Patient Instructions (Signed)
Iron Deficiency Anemia, Adult  Iron-deficiency anemia is when you have a low amount of red blood cells or hemoglobin. This happens because you have too little iron in your body. Hemoglobin carries oxygen to parts of the body. Anemia can cause your body to not get enough oxygen. It may or may not cause symptoms.  Follow these instructions at home:  Medicines  · Take over-the-counter and prescription medicines only as told by your doctor. This includes iron pills (supplements) and vitamins.  · If you cannot handle taking iron pills by mouth, ask your doctor about getting iron through:  ? A vein (intravenously).  ? A shot (injection) into a muscle.  · Take iron pills when your stomach is empty. If you cannot handle this, take them with food.  · Do not drink milk or take antacids at the same time as your iron pills.  · To prevent trouble pooping (constipation), eat fiber or take medicine (stool softener) as told by your doctor.  Eating and drinking    · Talk with your doctor before changing the foods you eat. He or she may tell you to eat foods that have a lot of iron, such as:  ? Liver.  ? Lowfat (lean) beef.  ? Breads and cereals that have iron added to them (fortified breads and cereals).  ? Eggs.  ? Dried fruit.  ? Dark green, leafy vegetables.  · Drink enough fluid to keep your pee (urine) clear or pale yellow.  · Eat fresh fruits and vegetables that are high in vitamin C. They help your body to use iron. Foods with a lot of vitamin C include:  ? Oranges.  ? Peppers.  ? Tomatoes.  ? Mangoes.  General instructions  · Return to your normal activities as told by your doctor. Ask your doctor what activities are safe for you.  · Keep yourself clean, and keep things clean around you (your surroundings). Anemia can make you get sick more easily.  · Keep all follow-up visits as told by your doctor. This is important.  Contact a doctor if:  · You feel sick to your stomach (nauseous).  · You throw up (vomit).  · You feel  weak.  · You are sweating for no clear reason.  · You have trouble pooping, such as:  ? Pooping (having a bowel movement) less than 3 times a week.  ? Straining to poop.  ? Having poop that is hard, dry, or larger than normal.  ? Feeling full or bloated.  ? Pain in the lower belly.  ? Not feeling better after pooping.  Get help right away if:  · You pass out (faint). If this happens, do not drive yourself to the hospital. Call your local emergency services (911 in the [place]  · You have chest pain.  · You have shortness of breath that:  ? Is very bad.  ? Gets worse with physical activity.  · You have a fast heartbeat.  · You get light-headed when getting up from sitting or lying down.  This information is not intended to replace advice given to you by your health care provider. Make sure you discuss any questions you have with your health care provider.  Document Released: 03/01/2010 Document Revised: 10/17/2015 Document Reviewed: 10/17/2015  Elsevier Interactive Patient Education © 2019 Elsevier Inc[employer]

## 2018-07-27 DIAGNOSIS — Z5181 Encounter for therapeutic drug level monitoring: Secondary | ICD-10-CM | POA: Diagnosis not present

## 2018-07-27 DIAGNOSIS — G894 Chronic pain syndrome: Secondary | ICD-10-CM | POA: Diagnosis not present

## 2018-07-27 DIAGNOSIS — Z79891 Long term (current) use of opiate analgesic: Secondary | ICD-10-CM | POA: Diagnosis not present

## 2018-07-27 DIAGNOSIS — M545 Low back pain: Secondary | ICD-10-CM | POA: Diagnosis not present

## 2018-07-27 LAB — IRON AND TIBC
Iron: 32 ug/dL — ABNORMAL LOW (ref 41–142)
Saturation Ratios: 8 % — ABNORMAL LOW (ref 21–57)
TIBC: 385 ug/dL (ref 236–444)
UIBC: 354 ug/dL (ref 120–384)

## 2018-07-27 LAB — FERRITIN: Ferritin: <4 ng/mL — ABNORMAL LOW (ref 11–307)

## 2018-08-02 ENCOUNTER — Other Ambulatory Visit: Payer: Self-pay

## 2018-08-02 ENCOUNTER — Inpatient Hospital Stay: Payer: Medicare Other

## 2018-08-02 VITALS — BP 118/77 | HR 90 | Temp 98.6°F | Resp 18

## 2018-08-02 DIAGNOSIS — D509 Iron deficiency anemia, unspecified: Secondary | ICD-10-CM | POA: Diagnosis not present

## 2018-08-02 DIAGNOSIS — Z9884 Bariatric surgery status: Secondary | ICD-10-CM | POA: Diagnosis not present

## 2018-08-02 DIAGNOSIS — D508 Other iron deficiency anemias: Secondary | ICD-10-CM

## 2018-08-02 DIAGNOSIS — Z79899 Other long term (current) drug therapy: Secondary | ICD-10-CM | POA: Diagnosis not present

## 2018-08-02 DIAGNOSIS — K912 Postsurgical malabsorption, not elsewhere classified: Secondary | ICD-10-CM | POA: Diagnosis not present

## 2018-08-02 MED ORDER — SODIUM CHLORIDE 0.9 % IV SOLN
Freq: Once | INTRAVENOUS | Status: AC
  Administered 2018-08-02: 14:00:00 via INTRAVENOUS
  Filled 2018-08-02: qty 250

## 2018-08-02 MED ORDER — SODIUM CHLORIDE 0.9 % IV SOLN
510.0000 mg | Freq: Once | INTRAVENOUS | Status: AC
  Administered 2018-08-02: 14:00:00 510 mg via INTRAVENOUS
  Filled 2018-08-02: qty 510

## 2018-08-02 NOTE — Patient Instructions (Signed)

## 2018-08-09 DIAGNOSIS — M533 Sacrococcygeal disorders, not elsewhere classified: Secondary | ICD-10-CM | POA: Diagnosis not present

## 2018-08-09 DIAGNOSIS — M47816 Spondylosis without myelopathy or radiculopathy, lumbar region: Secondary | ICD-10-CM | POA: Diagnosis not present

## 2018-08-24 DIAGNOSIS — Z79891 Long term (current) use of opiate analgesic: Secondary | ICD-10-CM | POA: Diagnosis not present

## 2018-08-24 DIAGNOSIS — G894 Chronic pain syndrome: Secondary | ICD-10-CM | POA: Diagnosis not present

## 2018-08-24 DIAGNOSIS — Z5181 Encounter for therapeutic drug level monitoring: Secondary | ICD-10-CM | POA: Diagnosis not present

## 2018-08-24 DIAGNOSIS — M545 Low back pain: Secondary | ICD-10-CM | POA: Diagnosis not present

## 2018-08-26 DIAGNOSIS — M47816 Spondylosis without myelopathy or radiculopathy, lumbar region: Secondary | ICD-10-CM | POA: Diagnosis not present

## 2018-08-31 ENCOUNTER — Other Ambulatory Visit: Payer: Self-pay | Admitting: Internal Medicine

## 2018-08-31 DIAGNOSIS — Z1231 Encounter for screening mammogram for malignant neoplasm of breast: Secondary | ICD-10-CM

## 2018-09-09 ENCOUNTER — Ambulatory Visit (INDEPENDENT_AMBULATORY_CARE_PROVIDER_SITE_OTHER): Payer: Medicare Other

## 2018-09-09 ENCOUNTER — Other Ambulatory Visit: Payer: Self-pay

## 2018-09-09 VITALS — Ht 65.0 in | Wt 271.0 lb

## 2018-09-09 DIAGNOSIS — Z0000 Encounter for general adult medical examination without abnormal findings: Secondary | ICD-10-CM

## 2018-09-09 NOTE — Progress Notes (Signed)
Subjective:   Natalie Guerrero is a 53 y.o. female who presents for an Initial Medicare Annual Wellness Visit.  This visit type was conducted due to national recommendations for restrictions regarding the COVID-19 Pandemic (e.g. social distancing). This format is felt to be most appropriate for this patient at this time. All issues noted in this document were discussed and addressed. No physical exam was performed (except for noted visual exam findings with Video Visits). This patient, Natalie Guerrero, has given permission to perform this visit via telephone. Vital signs may be absent or patient reported.  Patient location:  At home  Nurse location:  [facility] office     Review of Systems    n/a  Cardiac Risk Factors include: sedentary lifestyle;obesity (BMI >30kg/m2)     Objective:    Today's Vitals   09/09/18 1146 09/09/18 1147  Weight: 271 lb (122.9 kg)   Height: 5\' 5"  (1.651 m)   PainSc:  4    Body mass index is 45.1 kg/m.  Advanced Directives 09/09/2018 07/26/2018 12/22/2017 12/14/2017 01/28/2017 07/12/2015  Does Patient Have a Medical Advance Directive? No No No No No No  Would patient like information on creating a medical advance directive? - No - Patient declined No - Patient declined No - Patient declined No - Patient declined No - patient declined information    Current Medications (verified) Outpatient Encounter Medications as of 09/09/2018  Medication Sig  . Biotin 10 MG CAPS Take 30 mg by mouth daily.   Marland Kitchen. buPROPion (WELLBUTRIN XL) 150 MG 24 hr tablet Take 1 tablet (150 mg total) by mouth daily.  . Carboxymethylcellulose Sodium (REFRESH LIQUIGEL) 1 % GEL Place 1-2 drops into both eyes 4 (four) times daily as needed (for dry eyes).  . Cholecalciferol (VITAMIN D3) 2000 units TABS Take 10,000 Units by mouth daily.  . COLLAGEN PO Take 2 Scoops by mouth daily.  . Cyanocobalamin (VITAMIN B-12) 5000 MCG SUBL Take 10,000 mcg by mouth daily.  . cyclobenzaprine  (FLEXERIL) 10 MG tablet 1 tablet 4 times per day  . Dextromethorphan-guaiFENesin (MUCINEX DM MAXIMUM STRENGTH) 60-1200 MG TB12 Take 1 tablet by mouth 2 (two) times daily.  . diclofenac (VOLTAREN) 75 MG EC tablet Take 75 mg by mouth 2 (two) times daily.  . diphenhydrAMINE (BENADRYL) 25 mg capsule Take 50 mg by mouth 2 (two) times daily.  Marland Kitchen. docusate sodium (COLACE) 100 MG capsule Take 200 mg by mouth 2 (two) times daily.   Marland Kitchen. FIBER PO Take 4 capsules by mouth daily.  . Glucosamine-Chondroit-Vit C-Mn (GLUCOSAMINE 1500 COMPLEX PO) Take 4 tablets by mouth daily.  Marland Kitchen. imipramine (TOFRANIL) 25 MG tablet Take 25 mg by mouth 3 (three) times daily.  Marland Kitchen. levothyroxine (SYNTHROID) 100 MCG tablet TAKE 1 TABLET (100 MCG TOTAL) BY MOUTH DAILY BEFORE BREAKFAST.  Marland Kitchen. Lifitegrast (XIIDRA) 5 % SOLN Place 1 drop into both eyes 2 (two) times daily.  . Liniments (SALONPAS PAIN RELIEF PATCH EX) Apply 1 patch topically daily.  Marland Kitchen. MAGNESIUM PO Take 5 tablets by mouth daily.   . methocarbamol (ROBAXIN) 750 MG tablet LIMIT 1 TABLET BY MOUTH 2 3 TIMES PER DAY IF TOLERATED. NO ORPHENADRINE NO NORFLEX  . OVER THE COUNTER MEDICATION Take 4 tablets by mouth daily. Liver Aid Supplement  . OVER THE COUNTER MEDICATION Apply 1 application topically 2 (two) times daily. Beck and Blackley Arthritis Cream  . oxyCODONE-acetaminophen (PERCOCET) 10-325 MG tablet   . Polyethyl Glycol-Propyl Glycol (SYSTANE ULTRA) 0.4-0.3 % SOLN Place 2  drops into both eyes 2 (two) times daily as needed (for dry eyes).  . topiramate (TOPAMAX) 100 MG tablet Take 100 mg by mouth 4 (four) times daily.   . Turmeric Curcumin 500 MG CAPS Take 1,000 mg by mouth 2 (two) times daily.  Marland Kitchen. ZINC-VITAMIN C PO Take 6 tablets by mouth daily.  . iron polysaccharides (NIFEREX) 150 MG capsule Take 150 mg by mouth daily.  . naproxen (NAPROSYN) 250 MG tablet LIMIT 2 3 TABLETS TWICE PER DAY IF TOLERATED. LIMIT 5 TABLETS PER DAY. NO IBUPROFEN NO DICLOFENAC   No facility-administered  encounter medications on file as of 09/09/2018.     Allergies (verified) Codeine   History: Past Medical History:  Diagnosis Date  . Anxiety   . Arthritis   . DDD (degenerative disc disease)   . Depression   . Hypothyroidism 12/08/2012  . Iron deficiency anemia, unspecified 11/10/2012  . Iron malabsorption 01/20/2017  . Obesity   . Thyroid disease hypothroidism   Past Surgical History:  Procedure Laterality Date  . ABDOMINAL HYSTERECTOMY  1999  . BACK SURGERY    . CESAREAN SECTION  1991  . CHOLECYSTECTOMY N/A 12/22/2017   Procedure: LAPAROSCOPIC CHOLECYSTECTOMY;  Surgeon: Axel Filleramirez, Armando, MD;  [facility];  Service: General;  Laterality: N/A;  . GASTRIC BYPASS  2007  . KNEE SURGERY Left 2000   Family History  Problem Relation Age of Onset  . Prostate cancer Father   . Heart failure Mother   . Colon cancer Neg Hx   . Diabetes Neg Hx   . Kidney disease Neg Hx   . Liver disease Neg Hx    Social History   Socioeconomic History  . Marital status: Married    Spouse name: Not on file  . Number of children: Not on file  . Years of education: Not on file  . Highest education level: Not on file  Occupational History  . Occupation: disability  Social Needs  . Financial resource strain: Not hard at all  . Food insecurity    Worry: Never true    Inability: Never true  . Transportation needs    Medical: No    Non-medical: No  Tobacco Use  . Smoking status: Never Smoker  . Smokeless tobacco: Never Used  Substance and Sexual Activity  . Alcohol use: Not Currently  . Drug use: Yes    Types: Oxycodone  . Sexual activity: Yes  Lifestyle  . Physical activity    Days per week: 1 day    Minutes per session: 10 min  . Stress: Not at all  Relationships  . Social Musicianconnections    Talks on phone: Not on file    Gets together: Not on file    Attends religious service: Not on file    Active member of club or organization: Not on file    Attends meetings of clubs or  organizations: Not on file    Relationship status: Not on file  Other Topics Concern  . Not on file  Social History Narrative  . Not on file    Tobacco Counseling Counseling given: Not Answered   Clinical Intake:  Pre-visit preparation completed: Yes  Pain : 0-10 Pain Score: 4  Pain Type: Chronic pain Pain Location: Back Pain Orientation: Lower Pain Descriptors / Indicators: Aching Pain Onset: More than a month ago Pain Frequency: Constant Pain Relieving Factors: cortisone injections help some  Pain Relieving Factors: cortisone injections help some  Nutritional Status: BMI > 30  Obese Nutritional Risks: None Diabetes: No  How often do you need to have someone help you when you read instructions, pamphlets, or other written materials from your doctor or pharmacy?: 1 - Never What is the last grade level you completed in school?: 12th grade  Interpreter Needed?: No  Information entered by :: NAllen LPN   Activities of Daily Living In your present state of health, do you have any difficulty performing the following activities: 09/09/2018 12/31/2017  Hearing? N N  Vision? N Y  Difficulty concentrating or making decisions? N Y  Walking or climbing stairs? N Y  Dressing or bathing? N N  Doing errands, shopping? N Y  Conservation officer, nature and eating ? N N  Using the Toilet? N N  In the past six months, have you accidently leaked urine? N Y  Do you have problems with loss of bowel control? N N  Managing your Medications? N N  Managing your Finances? N N  Housekeeping or managing your Housekeeping? N N  Some recent data might be hidden     Immunizations and Health Maintenance Immunization History  Administered Date(s) Administered  . Influenza,inj,Quad PF,6+ Mos 12/31/2017   Health Maintenance Due  Topic Date Due  . Samul Dada  01/27/1985    Patient Care Team: Glendale Chard, MD as PCP - General (Internal Medicine) Dalton-Bethea, Fabio Asa, MD as Consulting  Physician (Physical Medicine and Rehabilitation) Mohammed Kindle, MD as Consulting Physician (Pain Medicine)  Indicate any recent Medical Services you may have received from other than [facility] providers in the past year (date may be approximate).     Assessment:   This is a routine wellness examination for [place].  Hearing/Vision screen  Hearing Screening   125Hz  250Hz  500Hz  1000Hz  2000Hz  3000Hz  4000Hz  6000Hz  8000Hz   Right ear:           Left ear:           Vision Screening Comments: Annual eye exams Appointment 10/19/2018  Dietary issues and exercise activities discussed: Current Exercise Habits: The patient does not participate in regular exercise at present  Goals    . Exercise 3x per week (30 min per time)    . Weight (lb) < 200 lb (90.7 kg)     Our initial goal is to achieve BMI less than 39.     . Weight (lb) < 200 lb (90.7 kg)     09/09/2018, wants to lose 25 pounds      Depression Screen PHQ 2/9 Scores 09/09/2018 12/31/2017  PHQ - 2 Score 0 6  PHQ- 9 Score 0 15    Fall Risk Fall Risk  09/09/2018 12/31/2017  Falls in the past year? 1 0  Number falls in past yr: 0 -  Injury with Fall? 0 -  Comment reaching for the wall -  Risk for fall due to : History of fall(s);Medication side effect -  Follow up Falls evaluation completed;Falls prevention discussed -    Is the patient's home free of loose throw rugs in walkways, pet beds, electrical cords, etc?   yes      Grab bars in the bathroom? no      Handrails on the stairs?   yes      Adequate lighting?   yes  Timed Get Up and Go Performed n/a  Cognitive Function:     6CIT Screen 09/09/2018  What Year? 0 points  What month? 0 points  What time? 0 points  Count back from 20 0 points  Months in reverse 0 points  Repeat phrase 2 points  Total Score 2    Screening Tests Health Maintenance  Topic Date Due  . TETANUS/TDAP  01/27/1984  . INFLUENZA VACCINE  09/11/2018  . MAMMOGRAM  06/09/2019  . PAP  SMEAR-Modifier  06/27/2021  . COLONOSCOPY  08/22/2025  . HIV Screening  Completed    Qualifies for Shingles Vaccine? yes  Cancer Screenings: Lung: Low Dose CT Chest recommended if Age 33-80 years, 30 pack-year currently smoking OR have quit w/in 15years. Patient does not qualify. Breast: Up to date on Mammogram? Yes   Up to date of Bone Density/Dexa? Yes Colorectal: up to date  Additional Screenings:  Hepatitis C Screening: n/a     Plan:    6 CIT was 2. Patient wants to lose 25 pounds.   I have personally reviewed and noted the following in the patient's chart:   . Medical and social history . Use of alcohol, tobacco or illicit drugs  . Current medications and supplements . Functional ability and status . Nutritional status . Physical activity . Advanced directives . List of other physicians . Hospitalizations, surgeries, and ER visits in previous 12 months . Vitals . Screenings to include cognitive, depression, and falls . Referrals and appointments  In addition, I have reviewed and discussed with patient certain preventive protocols, quality metrics, and best practice recommendations. A written personalized care plan for preventive services as well as general preventive health recommendations were provided to patient.     Barb Merinoickeah E Lilian Fuhs, LPN   4/54/09817/30/2020

## 2018-09-09 NOTE — Patient Instructions (Signed)
Natalie Guerrero , Thank you for taking time to come for your Medicare Wellness Visit. I appreciate your ongoing commitment to your health goals. Please review the following plan we discussed and let me know if I can assist you in the future.   Screening recommendations/referrals: Colonoscopy: 08/2015 Mammogram: scheduled Bone Density: n/a Recommended yearly ophthalmology/optometry visit for glaucoma screening and checkup Recommended yearly dental visit for hygiene and checkup  Vaccinations: Influenza vaccine: 12/2017 Pneumococcal vaccine: n/a Tdap vaccine: due Shingles vaccine: discussed    Advanced directives: Advance directive discussed with you today.   Conditions/risks identified: obesity  Next appointment: 01/03/2019 at 10:00  Preventive Care 40-64 Years, Female Preventive care refers to lifestyle choices and visits with your health care provider that can promote health and wellness. What does preventive care include?  A yearly physical exam. This is also called an annual well check.  Dental exams once or twice a year.  Routine eye exams. Ask your health care provider how often you should have your eyes checked.  Personal lifestyle choices, including:  Daily care of your teeth and gums.  Regular physical activity.  Eating a healthy diet.  Avoiding tobacco and drug use.  Limiting alcohol use.  Practicing safe sex.  Taking low-dose aspirin daily starting at age 47.  Taking vitamin and mineral supplements as recommended by your health care provider. What happens during an annual well check? The services and screenings done by your health care provider during your annual well check will depend on your age, overall health, lifestyle risk factors, and family history of disease. Counseling  Your health care provider may ask you questions about your:  Alcohol use.  Tobacco use.  Drug use.  Emotional well-being.  Home and relationship well-being.  Sexual  activity.  Eating habits.  Work and work Statistician.  Method of birth control.  Menstrual cycle.  Pregnancy history. Screening  You may have the following tests or measurements:  Height, weight, and BMI.  Blood pressure.  Lipid and cholesterol levels. These may be checked every 5 years, or more frequently if you are over 52 years old.  Skin check.  Lung cancer screening. You may have this screening every year starting at age 11 if you have a 30-pack-year history of smoking and currently smoke or have quit within the past 15 years.  Fecal occult blood test (FOBT) of the stool. You may have this test every year starting at age 74.  Flexible sigmoidoscopy or colonoscopy. You may have a sigmoidoscopy every 5 years or a colonoscopy every 10 years starting at age 55.  Hepatitis C blood test.  Hepatitis B blood test.  Sexually transmitted disease (STD) testing.  Diabetes screening. This is done by checking your blood sugar (glucose) after you have not eaten for a while (fasting). You may have this done every 1-3 years.  Mammogram. This may be done every 1-2 years. Talk to your health care provider about when you should start having regular mammograms. This may depend on whether you have a family history of breast cancer.  BRCA-related cancer screening. This may be done if you have a family history of breast, ovarian, tubal, or peritoneal cancers.  Pelvic exam and Pap test. This may be done every 3 years starting at age 60. Starting at age 53, this may be done every 5 years if you have a Pap test in combination with an HPV test.  Bone density scan. This is done to screen for osteoporosis. You may have this scan  if you are at high risk for osteoporosis. Discuss your test results, treatment options, and if necessary, the need for more tests with your health care provider. Vaccines  Your health care provider may recommend certain vaccines, such as:  Influenza vaccine. This is  recommended every year.  Tetanus, diphtheria, and acellular pertussis (Tdap, Td) vaccine. You may need a Td booster every 10 years.  Zoster vaccine. You may need this after age 71.  Pneumococcal 13-valent conjugate (PCV13) vaccine. You may need this if you have certain conditions and were not previously vaccinated.  Pneumococcal polysaccharide (PPSV23) vaccine. You may need one or two doses if you smoke cigarettes or if you have certain conditions. Talk to your health care provider about which screenings and vaccines you need and how often you need them. This information is not intended to replace advice given to you by your health care provider. Make sure you discuss any questions you have with your health care provider. Document Released: 02/23/2015 Document Revised: 10/17/2015 Document Reviewed: 11/28/2014 Elsevier Interactive Patient Education  2017 [place] Prevention in the Home Falls can cause injuries. They can happen to people of all ages. There are many things you can do to make your home safe and to help prevent falls. What can I do on the outside of my home?  Regularly fix the edges of walkways and driveways and fix any cracks.  Remove anything that might make you trip as you walk through a door, such as a raised step or threshold.  Trim any bushes or trees on the path to your home.  Use bright outdoor lighting.  Clear any walking paths of anything that might make someone trip, such as rocks or tools.  Regularly check to see if handrails are loose or broken. Make sure that both sides of any steps have handrails.  Any raised decks and porches should have guardrails on the edges.  Have any leaves, snow, or ice cleared regularly.  Use sand or salt on walking paths during winter.  Clean up any spills in your garage right away. This includes oil or grease spills. What can I do in the bathroom?  Use night lights.  Install grab bars by the toilet and in  the tub and shower. Do not use towel bars as grab bars.  Use non-skid mats or decals in the tub or shower.  If you need to sit down in the shower, use a plastic, non-slip stool.  Keep the floor dry. Clean up any water that spills on the floor as soon as it happens.  Remove soap buildup in the tub or shower regularly.  Attach bath mats securely with double-sided non-slip rug tape.  Do not have throw rugs and other things on the floor that can make you trip. What can I do in the bedroom?  Use night lights.  Make sure that you have a light by your bed that is easy to reach.  Do not use any sheets or blankets that are too big for your bed. They should not hang down onto the floor.  Have a firm chair that has side arms. You can use this for support while you get dressed.  Do not have throw rugs and other things on the floor that can make you trip. What can I do in the kitchen?  Clean up any spills right away.  Avoid walking on wet floors.  Keep items that you use a lot in easy-to-reach places.  If  you need to reach something above you, use a strong step stool that has a grab bar.  Keep electrical cords out of the way.  Do not use floor polish or wax that makes floors slippery. If you must use wax, use non-skid floor wax.  Do not have throw rugs and other things on the floor that can make you trip. What can I do with my stairs?  Do not leave any items on the stairs.  Make sure that there are handrails on both sides of the stairs and use them. Fix handrails that are broken or loose. Make sure that handrails are as long as the stairways.  Check any carpeting to make sure that it is firmly attached to the stairs. Fix any carpet that is loose or worn.  Avoid having throw rugs at the top or bottom of the stairs. If you do have throw rugs, attach them to the floor with carpet tape.  Make sure that you have a light switch at the top of the stairs and the bottom of the stairs. If  you do not have them, ask someone to add them for you. What else can I do to help prevent falls?  Wear shoes that:  Do not have high heels.  Have rubber bottoms.  Are comfortable and fit you well.  Are closed at the toe. Do not wear sandals.  If you use a stepladder:  Make sure that it is fully opened. Do not climb a closed stepladder.  Make sure that both sides of the stepladder are locked into place.  Ask someone to hold it for you, if possible.  Clearly mark and make sure that you can see:  Any grab bars or handrails.  First and last steps.  Where the edge of each step is.  Use tools that help you move around (mobility aids) if they are needed. These include:  Canes.  Walkers.  Scooters.  Crutches.  Turn on the lights when you go into a dark area. Replace any light bulbs as soon as they burn out.  Set up your furniture so you have a clear path. Avoid moving your furniture around.  If any of your floors are uneven, fix them.  If there are any pets around you, be aware of where they are.  Review your medicines with your doctor. Some medicines can make you feel dizzy. This can increase your chance of falling. Ask your doctor what other things that you can do to help prevent falls. This information is not intended to replace advice given to you by your health care provider. Make sure you discuss any questions you have with your health care provider. Document Released: 11/23/2008 Document Revised: 07/05/2015 Document Reviewed: 03/03/2014 Elsevier Interactive Patient Education  2017 [employer].

## 2018-09-13 DIAGNOSIS — M533 Sacrococcygeal disorders, not elsewhere classified: Secondary | ICD-10-CM | POA: Diagnosis not present

## 2018-09-13 DIAGNOSIS — M47816 Spondylosis without myelopathy or radiculopathy, lumbar region: Secondary | ICD-10-CM | POA: Diagnosis not present

## 2018-09-20 ENCOUNTER — Other Ambulatory Visit: Payer: Self-pay

## 2018-09-20 ENCOUNTER — Encounter: Payer: Self-pay | Admitting: Internal Medicine

## 2018-09-20 MED ORDER — ESOMEPRAZOLE MAGNESIUM 20 MG PO CPDR: 20.0000 mg | DELAYED_RELEASE_CAPSULE | Freq: Every day | ORAL | 2 refills | Status: DC

## 2018-09-21 DIAGNOSIS — M47897 Other spondylosis, lumbosacral region: Secondary | ICD-10-CM | POA: Diagnosis not present

## 2018-09-21 DIAGNOSIS — Z79891 Long term (current) use of opiate analgesic: Secondary | ICD-10-CM | POA: Diagnosis not present

## 2018-09-21 DIAGNOSIS — G894 Chronic pain syndrome: Secondary | ICD-10-CM | POA: Diagnosis not present

## 2018-09-21 DIAGNOSIS — Z5181 Encounter for therapeutic drug level monitoring: Secondary | ICD-10-CM | POA: Diagnosis not present

## 2018-09-21 DIAGNOSIS — M545 Low back pain: Secondary | ICD-10-CM | POA: Diagnosis not present

## 2018-09-21 DIAGNOSIS — M4807 Spinal stenosis, lumbosacral region: Secondary | ICD-10-CM | POA: Diagnosis not present

## 2018-09-21 DIAGNOSIS — G8929 Other chronic pain: Secondary | ICD-10-CM | POA: Diagnosis not present

## 2018-09-21 DIAGNOSIS — M48062 Spinal stenosis, lumbar region with neurogenic claudication: Secondary | ICD-10-CM | POA: Diagnosis not present

## 2018-09-21 DIAGNOSIS — M792 Neuralgia and neuritis, unspecified: Secondary | ICD-10-CM | POA: Diagnosis not present

## 2018-09-22 ENCOUNTER — Other Ambulatory Visit: Payer: Self-pay

## 2018-09-22 MED ORDER — OMEPRAZOLE 20 MG PO CPDR: 20.0000 mg | DELAYED_RELEASE_CAPSULE | Freq: Every day | ORAL | 1 refills | Status: DC

## 2018-10-13 ENCOUNTER — Encounter: Payer: Self-pay | Admitting: Internal Medicine

## 2018-10-14 DIAGNOSIS — M533 Sacrococcygeal disorders, not elsewhere classified: Secondary | ICD-10-CM | POA: Diagnosis not present

## 2018-10-14 DIAGNOSIS — M7061 Trochanteric bursitis, right hip: Secondary | ICD-10-CM | POA: Diagnosis not present

## 2018-10-14 DIAGNOSIS — M7062 Trochanteric bursitis, left hip: Secondary | ICD-10-CM | POA: Diagnosis not present

## 2018-10-15 ENCOUNTER — Ambulatory Visit
Admission: RE | Admit: 2018-10-15 | Discharge: 2018-10-15 | Disposition: A | Discharge status: Home / Self Care | Payer: Medicare Other | Source: Ambulatory Visit | Attending: Internal Medicine | Admitting: Internal Medicine

## 2018-10-15 ENCOUNTER — Other Ambulatory Visit: Payer: Self-pay

## 2018-10-15 DIAGNOSIS — Z1231 Encounter for screening mammogram for malignant neoplasm of breast: Secondary | ICD-10-CM

## 2018-10-19 DIAGNOSIS — H40013 Open angle with borderline findings, low risk, bilateral: Secondary | ICD-10-CM | POA: Diagnosis not present

## 2018-10-20 DIAGNOSIS — M545 Low back pain: Secondary | ICD-10-CM | POA: Diagnosis not present

## 2018-10-20 DIAGNOSIS — Z79891 Long term (current) use of opiate analgesic: Secondary | ICD-10-CM | POA: Diagnosis not present

## 2018-10-20 DIAGNOSIS — Z5181 Encounter for therapeutic drug level monitoring: Secondary | ICD-10-CM | POA: Diagnosis not present

## 2018-10-20 DIAGNOSIS — G894 Chronic pain syndrome: Secondary | ICD-10-CM | POA: Diagnosis not present

## 2018-10-21 DIAGNOSIS — M1711 Unilateral primary osteoarthritis, right knee: Secondary | ICD-10-CM | POA: Diagnosis not present

## 2018-10-21 DIAGNOSIS — M1712 Unilateral primary osteoarthritis, left knee: Secondary | ICD-10-CM | POA: Diagnosis not present

## 2018-11-02 ENCOUNTER — Inpatient Hospital Stay (HOSPITAL_BASED_OUTPATIENT_CLINIC_OR_DEPARTMENT_OTHER): Payer: Medicare Other | Admitting: Family

## 2018-11-02 ENCOUNTER — Inpatient Hospital Stay: Payer: Medicare Other | Attending: Family

## 2018-11-02 ENCOUNTER — Encounter: Payer: Self-pay | Admitting: Internal Medicine

## 2018-11-02 ENCOUNTER — Encounter: Payer: Self-pay | Admitting: Family

## 2018-11-02 ENCOUNTER — Other Ambulatory Visit: Payer: Self-pay

## 2018-11-02 ENCOUNTER — Telehealth: Payer: Self-pay | Admitting: Family

## 2018-11-02 ENCOUNTER — Inpatient Hospital Stay: Payer: Medicare Other

## 2018-11-02 VITALS — BP 120/66 | HR 78 | Temp 97.5°F | Resp 18 | Ht 66.0 in | Wt 285.4 lb

## 2018-11-02 DIAGNOSIS — D509 Iron deficiency anemia, unspecified: Secondary | ICD-10-CM | POA: Diagnosis not present

## 2018-11-02 DIAGNOSIS — D508 Other iron deficiency anemias: Secondary | ICD-10-CM

## 2018-11-02 DIAGNOSIS — K912 Postsurgical malabsorption, not elsewhere classified: Secondary | ICD-10-CM | POA: Diagnosis not present

## 2018-11-02 DIAGNOSIS — G629 Polyneuropathy, unspecified: Secondary | ICD-10-CM | POA: Insufficient documentation

## 2018-11-02 DIAGNOSIS — K909 Intestinal malabsorption, unspecified: Secondary | ICD-10-CM

## 2018-11-02 DIAGNOSIS — Z9884 Bariatric surgery status: Secondary | ICD-10-CM

## 2018-11-02 LAB — CBC WITH DIFFERENTIAL (CANCER CENTER ONLY)
Abs Immature Granulocytes: 0.01 10*3/uL (ref 0.00–0.07)
Basophils Absolute: 0 10*3/uL (ref 0.0–0.1)
Basophils Relative: 1 %
Eosinophils Absolute: 0.1 10*3/uL (ref 0.0–0.5)
Eosinophils Relative: 2 %
HCT: 33.8 % — ABNORMAL LOW (ref 36.0–46.0)
Hemoglobin: 10.6 g/dL — ABNORMAL LOW (ref 12.0–15.0)
Immature Granulocytes: 0 %
Lymphocytes Relative: 42 %
Lymphs Abs: 2 10*3/uL (ref 0.7–4.0)
MCH: 32.8 pg (ref 26.0–34.0)
MCHC: 31.4 g/dL (ref 30.0–36.0)
MCV: 104.6 fL — ABNORMAL HIGH (ref 80.0–100.0)
Monocytes Absolute: 0.4 10*3/uL (ref 0.1–1.0)
Monocytes Relative: 9 %
Neutro Abs: 2.3 10*3/uL (ref 1.7–7.7)
Neutrophils Relative %: 46 %
Platelet Count: 282 10*3/uL (ref 150–400)
RBC: 3.23 MIL/uL — ABNORMAL LOW (ref 3.87–5.11)
RDW: 14.6 % (ref 11.5–15.5)
WBC Count: 4.9 10*3/uL (ref 4.0–10.5)
nRBC: 0 % (ref 0.0–0.2)

## 2018-11-02 LAB — RETICULOCYTES
Immature Retic Fract: 11.7 % (ref 2.3–15.9)
RBC.: 3.22 MIL/uL — ABNORMAL LOW (ref 3.87–5.11)
Retic Count, Absolute: 59.6 10*3/uL (ref 19.0–186.0)
Retic Ct Pct: 1.9 % (ref 0.4–3.1)

## 2018-11-02 NOTE — Telephone Encounter (Signed)
Appointments scheduled patient was ok with date/time per 9/22 los

## 2018-11-02 NOTE — Progress Notes (Signed)
Hematology and Oncology Follow Up Visit  Natalie Guerrero [id number] Jun 21, 1965 53 y.o. 11/02/2018   Principle Diagnosis:  Iron deficiency anemia secondary to malabsorption after gastric bypass (2007)  Current Therapy:   IV iron as indicated    Interim History:  Natalie Guerrero is here today for follow-up. She is feeling fatigued and states that she has been gaining a good bit of weight. Her weight is up 27 lbs in the last 8 months.  She plans to move her annual physical with her PCP up to discuss the weight gain.  She does not have much of an appetite but is staying well hydrated.  She has occasional episodes of dizziness.  No episodes of bleeding, no bruising or petechiae.  She has mid left abdominal nagging pain that she feels may be related to a hernia.  She has had no issues with infection. No fever, chills, n/v, cough, rash,  SOB, chest pain, palpitations or changes in bowel or bladder habits.  She takes a stool softener when needed for constipation.  The neuropathy in her hands and feet is stable.  No falls or syncopal episodes to report.  She is walking for exercise regularly.   ECOG Performance Status: 1 - Symptomatic but completely ambulatory  Medications:  Allergies as of 11/02/2018      Reactions   Codeine Itching      Medication List       Accurate as of November 02, 2018 12:37 PM. If you have any questions, ask your nurse or doctor.        Biotin 10 MG Caps Take 30 mg by mouth daily.   buPROPion 150 MG 24 hr tablet Commonly known as: WELLBUTRIN XL Take 1 tablet (150 mg total) by mouth daily.   COLLAGEN PO Take 2 Scoops by mouth daily.   cyclobenzaprine 10 MG tablet Commonly known as: FLEXERIL 1 tablet 4 times per day   diclofenac 75 MG EC tablet Commonly known as: VOLTAREN Take 75 mg by mouth 2 (two) times daily.   diphenhydrAMINE 25 mg capsule Commonly known as: BENADRYL Take 50 mg by mouth 2 (two) times daily.   docusate sodium 100  MG capsule Commonly known as: COLACE Take 200 mg by mouth 2 (two) times daily.   FIBER PO Take 4 capsules by mouth daily.   GLUCOSAMINE 1500 COMPLEX PO Take 4 tablets by mouth daily.   imipramine 25 MG tablet Commonly known as: TOFRANIL Take 25 mg by mouth 3 (three) times daily.   iron polysaccharides 150 MG capsule Commonly known as: NIFEREX Take 150 mg by mouth daily.   levothyroxine 100 MCG tablet Commonly known as: SYNTHROID TAKE 1 TABLET (100 MCG TOTAL) BY MOUTH DAILY BEFORE BREAKFAST.   MAGNESIUM PO Take 5 tablets by mouth daily.   methocarbamol 750 MG tablet Commonly known as: ROBAXIN LIMIT 1 TABLET BY MOUTH 2 3 TIMES PER DAY IF TOLERATED. NO ORPHENADRINE NO NORFLEX   Mucinex DM Maximum Strength 60-1200 MG Tb12 Take 1 tablet by mouth 2 (two) times daily.   naproxen 250 MG tablet Commonly known as: NAPROSYN LIMIT 2 3 TABLETS TWICE PER DAY IF TOLERATED. LIMIT 5 TABLETS PER DAY. NO IBUPROFEN NO DICLOFENAC   omeprazole 20 MG capsule Commonly known as: PRILOSEC Take 1 capsule (20 mg total) by mouth daily.   OVER THE COUNTER MEDICATION Take 4 tablets by mouth daily. Liver Aid Supplement   OVER THE COUNTER MEDICATION Apply 1 application topically 2 (two) times daily. [name] and Purdin  Arthritis Cream   oxyCODONE-acetaminophen 10-325 MG tablet Commonly known as: PERCOCET   Refresh Liquigel 1 % Gel Generic drug: Carboxymethylcellulose Sodium Place 1-2 drops into both eyes 4 (four) times daily as needed (for dry eyes).   SALONPAS PAIN RELIEF PATCH EX Apply 1 patch topically daily.   Systane Ultra 0.4-0.3 % Soln Generic drug: Polyethyl Glycol-Propyl Glycol Place 2 drops into both eyes 2 (two) times daily as needed (for dry eyes).   topiramate 100 MG tablet Commonly known as: TOPAMAX Take 100 mg by mouth 4 (four) times daily.   Turmeric Curcumin 500 MG Caps Take 1,000 mg by mouth 2 (two) times daily.   Vitamin B-12 5000 MCG Subl Take 10,000 mcg by mouth  daily.   Vitamin D3 50 MCG (2000 UT) Tabs Take 10,000 Units by mouth daily.   Xiidra 5 % Soln Generic drug: Lifitegrast Place 1 drop into both eyes 2 (two) times daily.   ZINC-VITAMIN C PO Take 6 tablets by mouth daily.       Allergies:  Allergies  Allergen Reactions  . Codeine Itching    Past Medical History, Surgical history, Social history, and Family History were reviewed and updated.  Review of Systems: All other 10 point review of systems is negative.   Physical Exam:  vitals were not taken for this visit.   Wt Readings from Last 3 Encounters:  09/09/18 271 lb (122.9 kg)  07/26/18 271 lb 1.9 oz (123 kg)  07/01/18 264 lb (119.7 kg)    Ocular: Sclerae unicteric, pupils equal, round and reactive to light Ear-nose-throat: Oropharynx clear, dentition fair Lymphatic: No cervical or supraclavicular adenopathy Lungs no rales or rhonchi, good excursion bilaterally Heart regular rate and rhythm, no murmur appreciated Abd soft, nontender, positive bowel sounds, no liver or spleen tip palpated on exam, no fluid wave  MSK no focal spinal tenderness, no joint edema Neuro: non-focal, well-oriented, appropriate affect Breasts: Deferred   Lab Results  Component Value Date   WBC 4.9 11/02/2018   HGB 10.6 (L) 11/02/2018   HCT 33.8 (L) 11/02/2018   MCV 104.6 (H) 11/02/2018   PLT 282 11/02/2018   Lab Results  Component Value Date   FERRITIN <4 (L) 07/26/2018   IRON 32 (L) 07/26/2018   TIBC 385 07/26/2018   UIBC 354 07/26/2018   IRONPCTSAT 8 (L) 07/26/2018   Lab Results  Component Value Date   RETICCTPCT 1.9 11/02/2018   RBC 3.23 (L) 11/02/2018   RBC 3.22 (L) 11/02/2018   RETICCTABS 76.62 12/08/2012   No results found for: KPAFRELGTCHN, LAMBDASER, KAPLAMBRATIO No results found for: IGGSERUM, IGA, IGMSERUM No results found for: Marda Stalker, SPEI   Chemistry      Component Value Date/Time   NA 136  07/01/2018 1106   NA 137 01/16/2017 1502   NA 141 11/10/2012 1050   K 4.3 07/01/2018 1106   K 3.7 01/16/2017 1502   K 3.8 11/10/2012 1050   CL 103 07/01/2018 1106   CL 98 01/16/2017 1502   CO2 21 07/01/2018 1106   CO2 25 01/16/2017 1502   CO2 28 11/10/2012 1050   BUN 15 07/01/2018 1106   BUN 14 01/16/2017 1502   BUN 7.5 11/10/2012 1050   CREATININE 0.94 07/01/2018 1106   CREATININE 1.1 01/16/2017 1502   CREATININE 0.8 11/10/2012 1050      Component Value Date/Time   CALCIUM 8.5 (L) 07/01/2018 1106   CALCIUM 8.8 01/16/2017 1502   CALCIUM 9.0  11/10/2012 1050   ALKPHOS 88 07/01/2018 1106   ALKPHOS 84 01/16/2017 1502   ALKPHOS 66 11/10/2012 1050   AST 20 07/01/2018 1106   AST 27 01/16/2017 1502   AST 34 11/10/2012 1050   ALT 16 07/01/2018 1106   ALT 23 01/16/2017 1502   ALT 30 11/10/2012 1050   BILITOT 0.3 07/01/2018 1106   BILITOT 0.60 01/16/2017 1502   BILITOT 0.57 11/10/2012 1050       Impression and Plan: Natalie Guerrero is a very pleasant 53 yo African American female with iron deficiency anemiasecondary to malabsorption since having her gastric bypass surgery in 2007. We will see what her iron studies show and bring her back in for infusion if needed.  We will go ahead and plan to see her back in another 3 months.  She will contact our office with any questions or concerns. We can certainly see her sooner if needed.   Laverna Peace, NP 9/22/202012:37 PM

## 2018-11-03 LAB — IRON AND TIBC
Iron: 94 ug/dL (ref 41–142)
Saturation Ratios: 39 % (ref 21–57)
TIBC: 238 ug/dL (ref 236–444)
UIBC: 144 ug/dL (ref 120–384)

## 2018-11-03 LAB — FERRITIN: Ferritin: 40 ng/mL (ref 11–307)

## 2018-11-08 ENCOUNTER — Encounter: Payer: Self-pay | Admitting: Internal Medicine

## 2018-11-08 ENCOUNTER — Ambulatory Visit (INDEPENDENT_AMBULATORY_CARE_PROVIDER_SITE_OTHER): Payer: Medicare Other | Admitting: Internal Medicine

## 2018-11-08 ENCOUNTER — Other Ambulatory Visit: Payer: Self-pay

## 2018-11-08 VITALS — BP 118/82 | HR 86 | Temp 98.6°F | Ht 64.6 in | Wt 279.6 lb

## 2018-11-08 DIAGNOSIS — R600 Localized edema: Secondary | ICD-10-CM | POA: Diagnosis not present

## 2018-11-08 DIAGNOSIS — Z9884 Bariatric surgery status: Secondary | ICD-10-CM

## 2018-11-08 DIAGNOSIS — Z23 Encounter for immunization: Secondary | ICD-10-CM | POA: Diagnosis not present

## 2018-11-08 DIAGNOSIS — Z6841 Body Mass Index (BMI) 40.0 and over, adult: Secondary | ICD-10-CM

## 2018-11-08 DIAGNOSIS — M461 Sacroiliitis, not elsewhere classified: Secondary | ICD-10-CM | POA: Diagnosis not present

## 2018-11-08 DIAGNOSIS — F331 Major depressive disorder, recurrent, moderate: Secondary | ICD-10-CM | POA: Diagnosis not present

## 2018-11-08 NOTE — Patient Instructions (Addendum)
Calm - magnesium supplement  8 ounces of cranberry juice, fill 64 ounce pitcher with water  Edema  Edema is when you have too much fluid in your body or under your skin. Edema may make your legs, feet, and ankles swell up. Swelling is also common in looser tissues, like around your eyes. This is a common condition. It gets more common as you get older. There are many possible causes of edema. Eating too much salt (sodium) and being on your feet or sitting for a long time can cause edema in your legs, feet, and ankles. Hot weather may make edema worse. Edema is usually painless. Your skin may look swollen or shiny. Follow these instructions at home:  Keep the swollen body part raised (elevated) above the level of your heart when you are sitting or lying down.  Do not sit still or stand for a long time.  Do not wear tight clothes. Do not wear garters on your upper legs.  Exercise your legs. This can help the swelling go down.  Wear elastic bandages or support stockings as told by your doctor.  Eat a low-salt (low-sodium) diet to reduce fluid as told by your doctor.  Depending on the cause of your swelling, you may need to limit how much fluid you drink (fluid restriction).  Take over-the-counter and prescription medicines only as told by your doctor. Contact a doctor if:  Treatment is not working.  You have heart, liver, or kidney disease and have symptoms of edema.  You have sudden and unexplained weight gain. Get help right away if:  You have shortness of breath or chest pain.  You cannot breathe when you lie down.  You have pain, redness, or warmth in the swollen areas.  You have heart, liver, or kidney disease and get edema all of a sudden.  You have a fever and your symptoms get worse all of a sudden. Summary  Edema is when you have too much fluid in your body or under your skin.  Edema may make your legs, feet, and ankles swell up. Swelling is also common in looser  tissues, like around your eyes.  Raise (elevate) the swollen body part above the level of your heart when you are sitting or lying down.  Follow your doctor's instructions about diet and how much fluid you can drink (fluid restriction). This information is not intended to replace advice given to you by your health care provider. Make sure you discuss any questions you have with your health care provider. Document Released: 07/16/2007 Document Revised: 01/30/2017 Document Reviewed: 02/15/2016 Elsevier Patient Education  2020 [employer].

## 2018-11-09 NOTE — Progress Notes (Signed)
Subjective:     Patient ID: Natalie Guerrero , female    DOB: [birth year] , 53 y.o.   MRN: [id number]   Chief Complaint  Patient presents with  . Leg Swelling    weight gain,stumbbling, knot in stomach    HPI  She is here today for further evaluation of lower extremity edema.  She feels her symptoms have worsened over past several weeks. She reports h/o OSA, reports compliance with CPAP.  She denies orthopnea and DOE.     Past Medical History:  Diagnosis Date  . Anxiety   . Arthritis   . DDD (degenerative disc disease)   . Depression   . Hypothyroidism 12/08/2012  . Iron deficiency anemia, unspecified 11/10/2012  . Iron malabsorption 01/20/2017  . Obesity   . Thyroid disease hypothroidism     Family History  Problem Relation Age of Onset  . Prostate cancer Father   . Heart failure Mother   . Colon cancer Neg Hx   . Diabetes Neg Hx   . Kidney disease Neg Hx   . Liver disease Neg Hx      Current Outpatient Medications:  .  Biotin 10 MG CAPS, Take 30 mg by mouth daily. , Disp: , Rfl:  .  buPROPion (WELLBUTRIN XL) 150 MG 24 hr tablet, Take 1 tablet (150 mg total) by mouth daily., Disp: 90 tablet, Rfl: 1 .  Carboxymethylcellulose Sodium (REFRESH LIQUIGEL) 1 % GEL, Place 1-2 drops into both eyes 4 (four) times daily as needed (for dry eyes)., Disp: , Rfl:  .  Cholecalciferol (VITAMIN D3) 2000 units TABS, Take 10,000 Units by mouth daily., Disp: , Rfl:  .  COLLAGEN PO, Take 2 Scoops by mouth daily., Disp: , Rfl:  .  Cyanocobalamin (VITAMIN B-12) 5000 MCG SUBL, Take 10,000 mcg by mouth daily., Disp: , Rfl:  .  cyclobenzaprine (FLEXERIL) 10 MG tablet, 1 tablet 4 times per day, Disp: , Rfl:  .  Dextromethorphan-guaiFENesin (MUCINEX DM MAXIMUM STRENGTH) 60-1200 MG TB12, Take 1 tablet by mouth 2 (two) times daily., Disp: , Rfl:  .  diclofenac (VOLTAREN) 75 MG EC tablet, Take 75 mg by mouth 2 (two) times daily., Disp: , Rfl:  .  diphenhydrAMINE (BENADRYL) 25 mg capsule,  Take 50 mg by mouth 2 (two) times daily., Disp: , Rfl:  .  docusate sodium (COLACE) 100 MG capsule, Take 200 mg by mouth 2 (two) times daily. , Disp: , Rfl:  .  FIBER PO, Take 4 capsules by mouth daily., Disp: , Rfl:  .  Glucosamine-Chondroit-Vit C-Mn (GLUCOSAMINE 1500 COMPLEX PO), Take 4 tablets by mouth daily., Disp: , Rfl:  .  imipramine (TOFRANIL) 25 MG tablet, Take 25 mg by mouth 3 (three) times daily., Disp: , Rfl: 4 .  levothyroxine (SYNTHROID) 100 MCG tablet, TAKE 1 TABLET (100 MCG TOTAL) BY MOUTH DAILY BEFORE BREAKFAST., Disp: 90 tablet, Rfl: 1 .  Liniments (SALONPAS PAIN RELIEF PATCH EX), Apply 1 patch topically daily., Disp: , Rfl:  .  MAGNESIUM PO, Take 5 tablets by mouth daily. , Disp: , Rfl:  .  methocarbamol (ROBAXIN) 750 MG tablet, LIMIT 1 TABLET BY MOUTH 2 3 TIMES PER DAY IF TOLERATED. NO ORPHENADRINE NO NORFLEX, Disp: , Rfl:  .  omeprazole (PRILOSEC) 20 MG capsule, Take 1 capsule (20 mg total) by mouth daily., Disp: 90 capsule, Rfl: 1 .  OVER THE COUNTER MEDICATION, Take 4 tablets by mouth daily. Liver Aid Supplement, Disp: , Rfl:  .  OVER THE COUNTER  MEDICATION, Apply 1 application topically 2 (two) times daily. Beck and Blackley Arthritis Cream, Disp: , Rfl:  .  oxyCODONE-acetaminophen (PERCOCET) 10-325 MG tablet, , Disp: , Rfl:  .  Polyethyl Glycol-Propyl Glycol (SYSTANE ULTRA) 0.4-0.3 % SOLN, Place 2 drops into both eyes 2 (two) times daily as needed (for dry eyes)., Disp: , Rfl:  .  topiramate (TOPAMAX) 100 MG tablet, Take 100 mg by mouth 4 (four) times daily. , Disp: , Rfl: 5 .  Turmeric Curcumin 500 MG CAPS, Take 1,000 mg by mouth 2 (two) times daily., Disp: , Rfl:  .  ZINC-VITAMIN C PO, Take 6 tablets by mouth daily., Disp: , Rfl:    Allergies  Allergen Reactions  . Codeine Itching     Review of Systems  Constitutional: Negative.   Respiratory: Negative.  Negative for shortness of breath.   Cardiovascular: Positive for leg swelling.  Gastrointestinal: Negative.    Neurological: Negative.   Psychiatric/Behavioral: Negative.      Today's Vitals   11/08/18 1611  BP: 118/82  Pulse: 86  Temp: 98.6 F (37 C)  TempSrc: Oral  Weight: 279 lb 9.6 oz (126.8 kg)  Height: 5' 4.6" (1.641 m)   Body mass index is 47.11 kg/m.   Objective:  Physical Exam Vitals signs and nursing note reviewed.  Constitutional:      Appearance: Normal appearance. She is obese.  HENT:     Head: Normocephalic and atraumatic.  Cardiovascular:     Rate and Rhythm: Normal rate and regular rhythm.     Heart sounds: Normal heart sounds.  Pulmonary:     Effort: Pulmonary effort is normal.     Breath sounds: Normal breath sounds.  Musculoskeletal:     Right lower leg: 1+ Pitting Edema present.     Left lower leg: 1+ Pitting Edema present.  Skin:    General: Skin is warm.  Neurological:     General: No focal deficit present.     Mental Status: She is alert.  Psychiatric:        Mood and Affect: Mood normal.        Behavior: Behavior normal.         Assessment And Plan:     1. Bilateral lower extremity edema  She appears disappointed that I did not recommend diuretics as treatment. I want her to first increase her water intake, decrease her salt intake, decrease intake of packaged foods which tend to be high in sodium and to elevate her legs while seated. We also discussed the importance of following an anti-inflammatory diet and to remove processed foods from her diet.  She will call me in two weeks to let me know how she is doing. I will make further recommendations at this time.   2. Sacroiliitis (HCC)  Chronic. She is not experiencing any discomfort at this time. However, she does admit to intermittent flares.   3. Moderate episode of recurrent major depressive disorder (HCC)  Chronic, she will continue with current meds.   4. Class 3 severe obesity due to excess calories with serious comorbidity and body mass index (BMI) of 45.0 to 49.9 in adult  [facility])  She is s/p bariatric surgery in 2007.  She is not sure if she wants to go to formal weight loss clinic. She is encouraged to incorporate more exercise into her daily routine. She is reminded chair exercises can help augment her weight loss efforts.   5. Flu vaccine need  She was given flu vaccine  to update her immunization history.   Gwynneth Alimentobyn N Kysean Sweet, MD    THE PATIENT IS ENCOURAGED TO PRACTICE SOCIAL DISTANCING DUE TO THE COVID-19 PANDEMIC.

## 2018-11-10 DIAGNOSIS — K432 Incisional hernia without obstruction or gangrene: Secondary | ICD-10-CM | POA: Diagnosis not present

## 2018-11-16 DIAGNOSIS — G894 Chronic pain syndrome: Secondary | ICD-10-CM | POA: Diagnosis not present

## 2018-11-16 DIAGNOSIS — M545 Low back pain: Secondary | ICD-10-CM | POA: Diagnosis not present

## 2018-11-16 DIAGNOSIS — Z79891 Long term (current) use of opiate analgesic: Secondary | ICD-10-CM | POA: Diagnosis not present

## 2018-11-16 DIAGNOSIS — Z5181 Encounter for therapeutic drug level monitoring: Secondary | ICD-10-CM | POA: Diagnosis not present

## 2018-11-26 ENCOUNTER — Other Ambulatory Visit: Payer: Self-pay | Admitting: Internal Medicine

## 2018-11-30 ENCOUNTER — Encounter: Payer: Self-pay | Admitting: Family

## 2018-12-01 DIAGNOSIS — M1712 Unilateral primary osteoarthritis, left knee: Secondary | ICD-10-CM | POA: Diagnosis not present

## 2018-12-01 DIAGNOSIS — M1711 Unilateral primary osteoarthritis, right knee: Secondary | ICD-10-CM | POA: Diagnosis not present

## 2018-12-06 ENCOUNTER — Encounter: Payer: Self-pay | Admitting: Internal Medicine

## 2018-12-06 ENCOUNTER — Encounter: Payer: Self-pay | Admitting: Family

## 2018-12-09 DIAGNOSIS — M1712 Unilateral primary osteoarthritis, left knee: Secondary | ICD-10-CM | POA: Diagnosis not present

## 2018-12-09 DIAGNOSIS — M1711 Unilateral primary osteoarthritis, right knee: Secondary | ICD-10-CM | POA: Diagnosis not present

## 2018-12-14 DIAGNOSIS — Z79891 Long term (current) use of opiate analgesic: Secondary | ICD-10-CM | POA: Diagnosis not present

## 2018-12-14 DIAGNOSIS — M545 Low back pain: Secondary | ICD-10-CM | POA: Diagnosis not present

## 2018-12-14 DIAGNOSIS — G894 Chronic pain syndrome: Secondary | ICD-10-CM | POA: Diagnosis not present

## 2018-12-14 DIAGNOSIS — Z5181 Encounter for therapeutic drug level monitoring: Secondary | ICD-10-CM | POA: Diagnosis not present

## 2018-12-15 ENCOUNTER — Other Ambulatory Visit: Payer: Self-pay | Admitting: Internal Medicine

## 2018-12-17 ENCOUNTER — Other Ambulatory Visit: Payer: Self-pay

## 2018-12-17 DIAGNOSIS — Z20822 Contact with and (suspected) exposure to covid-19: Secondary | ICD-10-CM

## 2018-12-18 LAB — NOVEL CORONAVIRUS, NAA: SARS-CoV-2, NAA: NOT DETECTED

## 2018-12-20 ENCOUNTER — Encounter: Payer: Self-pay | Admitting: Internal Medicine

## 2018-12-30 DIAGNOSIS — M1711 Unilateral primary osteoarthritis, right knee: Secondary | ICD-10-CM | POA: Diagnosis not present

## 2018-12-30 DIAGNOSIS — M1712 Unilateral primary osteoarthritis, left knee: Secondary | ICD-10-CM | POA: Diagnosis not present

## 2019-01-03 ENCOUNTER — Ambulatory Visit: Payer: Medicare Other | Admitting: Internal Medicine

## 2019-01-11 DIAGNOSIS — M545 Low back pain: Secondary | ICD-10-CM | POA: Diagnosis not present

## 2019-01-11 DIAGNOSIS — G894 Chronic pain syndrome: Secondary | ICD-10-CM | POA: Diagnosis not present

## 2019-01-11 DIAGNOSIS — I82409 Acute embolism and thrombosis of unspecified deep veins of unspecified lower extremity: Secondary | ICD-10-CM | POA: Diagnosis not present

## 2019-01-11 DIAGNOSIS — Z79891 Long term (current) use of opiate analgesic: Secondary | ICD-10-CM | POA: Diagnosis not present

## 2019-01-11 DIAGNOSIS — Z5181 Encounter for therapeutic drug level monitoring: Secondary | ICD-10-CM | POA: Diagnosis not present

## 2019-01-11 DIAGNOSIS — M5136 Other intervertebral disc degeneration, lumbar region: Secondary | ICD-10-CM | POA: Diagnosis not present

## 2019-01-12 ENCOUNTER — Ambulatory Visit: Payer: Medicare Other

## 2019-01-18 ENCOUNTER — Other Ambulatory Visit: Payer: Self-pay | Admitting: Family

## 2019-01-21 ENCOUNTER — Other Ambulatory Visit: Payer: Self-pay

## 2019-01-21 DIAGNOSIS — Z20822 Contact with and (suspected) exposure to covid-19: Secondary | ICD-10-CM

## 2019-01-23 LAB — NOVEL CORONAVIRUS, NAA: SARS-CoV-2, NAA: NOT DETECTED

## 2019-02-01 ENCOUNTER — Other Ambulatory Visit: Payer: Medicare Other

## 2019-02-01 ENCOUNTER — Ambulatory Visit: Payer: Medicare Other | Admitting: Family

## 2019-02-08 DIAGNOSIS — Z79891 Long term (current) use of opiate analgesic: Secondary | ICD-10-CM | POA: Diagnosis not present

## 2019-02-08 DIAGNOSIS — M545 Low back pain: Secondary | ICD-10-CM | POA: Diagnosis not present

## 2019-02-08 DIAGNOSIS — G894 Chronic pain syndrome: Secondary | ICD-10-CM | POA: Diagnosis not present

## 2019-02-08 DIAGNOSIS — Z5181 Encounter for therapeutic drug level monitoring: Secondary | ICD-10-CM | POA: Diagnosis not present

## 2019-02-28 ENCOUNTER — Ambulatory Visit (INDEPENDENT_AMBULATORY_CARE_PROVIDER_SITE_OTHER): Payer: Medicare Other | Admitting: Internal Medicine

## 2019-02-28 ENCOUNTER — Other Ambulatory Visit: Payer: Self-pay

## 2019-02-28 ENCOUNTER — Encounter: Payer: Self-pay | Admitting: Internal Medicine

## 2019-02-28 VITALS — BP 128/70 | HR 111 | Temp 97.5°F

## 2019-02-28 DIAGNOSIS — R000 Tachycardia, unspecified: Secondary | ICD-10-CM | POA: Diagnosis not present

## 2019-02-28 DIAGNOSIS — R350 Frequency of micturition: Secondary | ICD-10-CM

## 2019-02-28 DIAGNOSIS — N390 Urinary tract infection, site not specified: Secondary | ICD-10-CM

## 2019-02-28 DIAGNOSIS — R509 Fever, unspecified: Secondary | ICD-10-CM

## 2019-02-28 LAB — POCT URINALYSIS DIPSTICK
Bilirubin, UA: NEGATIVE
Glucose, UA: NEGATIVE
Ketones, UA: NEGATIVE
Nitrite, UA: NEGATIVE
Protein, UA: POSITIVE — AB
Spec Grav, UA: 1.01 (ref 1.010–1.025)
Urobilinogen, UA: 0.2 E.U./dL
pH, UA: 7 (ref 5.0–8.0)

## 2019-02-28 LAB — POC INFLUENZA A&B (BINAX/QUICKVUE)
Influenza A, POC: NEGATIVE
Influenza B, POC: NEGATIVE

## 2019-02-28 MED ORDER — NITROFURANTOIN MONOHYD MACRO 100 MG PO CAPS: 100.0000 mg | ORAL_CAPSULE | Freq: Two times a day (BID) | ORAL | 0 refills | Status: AC

## 2019-02-28 NOTE — Patient Instructions (Signed)

## 2019-02-28 NOTE — Progress Notes (Signed)
This visit occurred during the SARS-CoV-2 public health emergency.  Safety protocols were in place, including screening questions prior to the visit, additional usage of staff PPE, and extensive cleaning of exam room while observing appropriate contact time as indicated for disinfecting solutions.  Subjective:     Patient ID: Natalie Guerrero , female    DOB: 1965-02-27 , 54 y.o.   MRN: [id number]   Chief Complaint  Patient presents with  . Urinary Tract Infection    HPI  She presents today for evaluation of possible UTI. She reports her urine has a strong smell and she has urinary frequency. She denies dysuria. Her sx started about 4 days ago. She states she had fever for past 2-3 days, ranging from 99.7 and 102. Of note, when patient called for appt, she neglected to mention she had fever/chills/body aches.   Urinary Tract Infection  This is a new problem. The current episode started in the past 7 days. The problem occurs every urination. The problem has been unchanged. The patient is experiencing no pain. The maximum temperature recorded prior to her arrival was 102 - 102.9 F. The fever has been present for 1 - 2 days. She is sexually active. There is no history of pyelonephritis. Associated symptoms include chills, flank pain and frequency. Pertinent negatives include no hematuria. She has tried increased fluids for the symptoms. The treatment provided no relief.     Past Medical History:  Diagnosis Date  . Anxiety   . Arthritis   . DDD (degenerative disc disease)   . Depression   . Hypothyroidism 12/08/2012  . Iron deficiency anemia, unspecified 11/10/2012  . Iron malabsorption 01/20/2017  . Obesity   . Thyroid disease hypothroidism     Family History  Problem Relation Age of Onset  . Prostate cancer Father   . Heart failure Mother   . Colon cancer Neg Hx   . Diabetes Neg Hx   . Kidney disease Neg Hx   . Liver disease Neg Hx      Current Outpatient  Medications:  .  Biotin 10 MG CAPS, Take 30 mg by mouth daily. , Disp: , Rfl:  .  buPROPion (WELLBUTRIN XL) 150 MG 24 hr tablet, TAKE 1 TABLET BY MOUTH EVERY DAY, Disp: 90 tablet, Rfl: 1 .  Carboxymethylcellulose Sodium (REFRESH LIQUIGEL) 1 % GEL, Place 1-2 drops into both eyes 4 (four) times daily as needed (for dry eyes)., Disp: , Rfl:  .  Cholecalciferol (VITAMIN D3) 2000 units TABS, Take 10,000 Units by mouth daily., Disp: , Rfl:  .  COLLAGEN PO, Take 2 Scoops by mouth daily., Disp: , Rfl:  .  Cyanocobalamin (VITAMIN B-12) 5000 MCG SUBL, Take 10,000 mcg by mouth daily., Disp: , Rfl:  .  cyclobenzaprine (FLEXERIL) 10 MG tablet, 1 tablet 4 times per day, Disp: , Rfl:  .  Dextromethorphan-guaiFENesin (MUCINEX DM MAXIMUM STRENGTH) 60-1200 MG TB12, Take 1 tablet by mouth 2 (two) times daily., Disp: , Rfl:  .  diphenhydrAMINE (BENADRYL) 25 mg capsule, Take 50 mg by mouth 2 (two) times daily., Disp: , Rfl:  .  docusate sodium (COLACE) 100 MG capsule, Take 200 mg by mouth 2 (two) times daily. , Disp: , Rfl:  .  FIBER PO, Take 4 capsules by mouth daily., Disp: , Rfl:  .  Glucosamine-Chondroit-Vit C-Mn (GLUCOSAMINE 1500 COMPLEX PO), Take 4 tablets by mouth daily., Disp: , Rfl:  .  imipramine (TOFRANIL) 25 MG tablet, Take 25 mg by mouth 3 (three)  times daily., Disp: , Rfl: 4 .  levothyroxine (SYNTHROID) 100 MCG tablet, TAKE 1 TABLET (100 MCG TOTAL) BY MOUTH DAILY BEFORE BREAKFAST., Disp: 90 tablet, Rfl: 1 .  Liniments (SALONPAS PAIN RELIEF PATCH EX), Apply 1 patch topically daily., Disp: , Rfl:  .  MAGNESIUM PO, Take 5 tablets by mouth daily. , Disp: , Rfl:  .  omeprazole (PRILOSEC) 20 MG capsule, Take 1 capsule (20 mg total) by mouth daily., Disp: 90 capsule, Rfl: 1 .  OVER THE COUNTER MEDICATION, Take 4 tablets by mouth daily. Liver Aid Supplement, Disp: , Rfl:  .  OVER THE COUNTER MEDICATION, Apply 1 application topically 2 (two) times daily. Beck and Blackley Arthritis Cream, Disp: , Rfl:  .   Polyethyl Glycol-Propyl Glycol (SYSTANE ULTRA) 0.4-0.3 % SOLN, Place 2 drops into both eyes 2 (two) times daily as needed (for dry eyes)., Disp: , Rfl:  .  topiramate (TOPAMAX) 100 MG tablet, Take 100 mg by mouth 4 (four) times daily. , Disp: , Rfl: 5 .  Turmeric Curcumin 500 MG CAPS, Take 1,000 mg by mouth 2 (two) times daily., Disp: , Rfl:  .  ZINC-VITAMIN C PO, Take 6 tablets by mouth daily., Disp: , Rfl:  .  diclofenac (VOLTAREN) 75 MG EC tablet, Take 75 mg by mouth 2 (two) times daily., Disp: , Rfl:  .  methocarbamol (ROBAXIN) 750 MG tablet, LIMIT 1 TABLET BY MOUTH 2 3 TIMES PER DAY IF TOLERATED. NO ORPHENADRINE NO NORFLEX, Disp: , Rfl:  .  nitrofurantoin, macrocrystal-monohydrate, (MACROBID) 100 MG capsule, Take 1 capsule (100 mg total) by mouth 2 (two) times daily for 7 days., Disp: 14 capsule, Rfl: 0 .  oxyCODONE-acetaminophen (PERCOCET) 10-325 MG tablet, , Disp: , Rfl:    Allergies  Allergen Reactions  . Codeine Itching     Review of Systems  Constitutional: Positive for chills, fatigue and fever.  Respiratory: Negative.   Cardiovascular: Negative.   Gastrointestinal: Negative.   Genitourinary: Positive for flank pain and frequency. Negative for hematuria.  Neurological: Negative.   Psychiatric/Behavioral: Negative.      Today's Vitals   02/28/19 1249  BP: 128/70  Pulse: (!) 111  Temp: (!) 97.5 F (36.4 C)   There is no height or weight on file to calculate BMI.   Objective:  Physical Exam Vitals and nursing note reviewed.  Constitutional:      Appearance: Normal appearance. She is obese.  HENT:     Head: Normocephalic and atraumatic.  Cardiovascular:     Rate and Rhythm: Normal rate and regular rhythm.     Heart sounds: Normal heart sounds.  Pulmonary:     Effort: Pulmonary effort is normal.     Breath sounds: Normal breath sounds.  Abdominal:     Palpations: Abdomen is soft.     Tenderness: There is no abdominal tenderness. There is no right CVA tenderness  or left CVA tenderness.     Comments: No suprapubic tenderness  Skin:    General: Skin is warm.  Neurological:     General: No focal deficit present.     Mental Status: She is alert.  Psychiatric:        Mood and Affect: Mood normal.        Behavior: Behavior normal.         Assessment And Plan:     1. Urinary frequency  I will check urinalysis to r/o UTI.   - POCT Urinalysis Dipstick (81002)  2. Urinary tract infection without hematuria,  site unspecified  She was given rx macrobid to take twice daily x 7 days. She is encouraged to take full abx course. Unfortunately, she did not give enough specimen to send off for urine culture.   - POCT Urinalysis Dipstick (81002) - nitrofurantoin, macrocrystal-monohydrate, (MACROBID) 100 MG capsule; Take 1 capsule (100 mg total) by mouth 2 (two) times daily for 7 days.  Dispense: 14 capsule; Refill: 0  3. Tachycardia  Likely related to recent fever/slight dehydration due to decreased po intake. She is encouraged to increase her fluid intake.   4. Febrile illness  She is flu A/B negative. COVID test pending. She is encouraged to take temperatures daily.   - Novel Coronavirus, NAA ([facility]) - POC Influenza A&B ([facility] test)     [name], MD    THE PATIENT IS ENCOURAGED TO PRACTICE SOCIAL DISTANCING DUE TO THE COVID-19 PANDEMIC.

## 2019-03-01 ENCOUNTER — Other Ambulatory Visit: Payer: Self-pay

## 2019-03-01 DIAGNOSIS — N390 Urinary tract infection, site not specified: Secondary | ICD-10-CM

## 2019-03-02 ENCOUNTER — Telehealth: Payer: Self-pay

## 2019-03-02 LAB — NOVEL CORONAVIRUS, NAA: SARS-CoV-2, NAA: NOT DETECTED

## 2019-03-02 NOTE — Telephone Encounter (Signed)
Spoke with pt to inform her that she should take tylenol for her fever and increase her water intake.

## 2019-03-03 LAB — URINE CULTURE

## 2019-03-07 DIAGNOSIS — M5136 Other intervertebral disc degeneration, lumbar region: Secondary | ICD-10-CM | POA: Diagnosis not present

## 2019-03-07 DIAGNOSIS — M179 Osteoarthritis of knee, unspecified: Secondary | ICD-10-CM | POA: Diagnosis not present

## 2019-03-07 DIAGNOSIS — M545 Low back pain: Secondary | ICD-10-CM | POA: Diagnosis not present

## 2019-03-07 DIAGNOSIS — G894 Chronic pain syndrome: Secondary | ICD-10-CM | POA: Diagnosis not present

## 2019-03-07 DIAGNOSIS — Z79891 Long term (current) use of opiate analgesic: Secondary | ICD-10-CM | POA: Diagnosis not present

## 2019-03-10 ENCOUNTER — Other Ambulatory Visit: Payer: Self-pay | Admitting: Nurse Practitioner

## 2019-03-25 ENCOUNTER — Encounter: Payer: Self-pay | Admitting: Internal Medicine

## 2019-03-25 ENCOUNTER — Other Ambulatory Visit: Payer: Self-pay

## 2019-04-04 DIAGNOSIS — G894 Chronic pain syndrome: Secondary | ICD-10-CM | POA: Diagnosis not present

## 2019-04-04 DIAGNOSIS — M545 Low back pain: Secondary | ICD-10-CM | POA: Diagnosis not present

## 2019-04-04 DIAGNOSIS — Z9889 Other specified postprocedural states: Secondary | ICD-10-CM | POA: Diagnosis not present

## 2019-04-04 DIAGNOSIS — Z79891 Long term (current) use of opiate analgesic: Secondary | ICD-10-CM | POA: Diagnosis not present

## 2019-04-04 DIAGNOSIS — M179 Osteoarthritis of knee, unspecified: Secondary | ICD-10-CM | POA: Diagnosis not present

## 2019-04-04 DIAGNOSIS — Z5181 Encounter for therapeutic drug level monitoring: Secondary | ICD-10-CM | POA: Diagnosis not present

## 2019-04-06 DIAGNOSIS — M533 Sacrococcygeal disorders, not elsewhere classified: Secondary | ICD-10-CM | POA: Diagnosis not present

## 2019-04-13 ENCOUNTER — Encounter: Payer: Self-pay | Admitting: Internal Medicine

## 2019-04-19 DIAGNOSIS — H40013 Open angle with borderline findings, low risk, bilateral: Secondary | ICD-10-CM | POA: Diagnosis not present

## 2019-04-20 DIAGNOSIS — M47816 Spondylosis without myelopathy or radiculopathy, lumbar region: Secondary | ICD-10-CM | POA: Diagnosis not present

## 2019-05-02 DIAGNOSIS — M179 Osteoarthritis of knee, unspecified: Secondary | ICD-10-CM | POA: Diagnosis not present

## 2019-05-02 DIAGNOSIS — M5136 Other intervertebral disc degeneration, lumbar region: Secondary | ICD-10-CM | POA: Diagnosis not present

## 2019-05-02 DIAGNOSIS — M545 Low back pain: Secondary | ICD-10-CM | POA: Diagnosis not present

## 2019-05-02 DIAGNOSIS — G894 Chronic pain syndrome: Secondary | ICD-10-CM | POA: Diagnosis not present

## 2019-05-20 ENCOUNTER — Other Ambulatory Visit: Payer: Self-pay | Admitting: Internal Medicine

## 2019-05-30 DIAGNOSIS — M48062 Spinal stenosis, lumbar region with neurogenic claudication: Secondary | ICD-10-CM | POA: Diagnosis not present

## 2019-05-30 DIAGNOSIS — I82409 Acute embolism and thrombosis of unspecified deep veins of unspecified lower extremity: Secondary | ICD-10-CM | POA: Diagnosis not present

## 2019-05-30 DIAGNOSIS — Z8249 Family history of ischemic heart disease and other diseases of the circulatory system: Secondary | ICD-10-CM | POA: Diagnosis not present

## 2019-05-30 DIAGNOSIS — G894 Chronic pain syndrome: Secondary | ICD-10-CM | POA: Diagnosis not present

## 2019-05-30 DIAGNOSIS — I119 Hypertensive heart disease without heart failure: Secondary | ICD-10-CM | POA: Diagnosis not present

## 2019-05-30 DIAGNOSIS — M25519 Pain in unspecified shoulder: Secondary | ICD-10-CM | POA: Diagnosis not present

## 2019-05-30 DIAGNOSIS — M5136 Other intervertebral disc degeneration, lumbar region: Secondary | ICD-10-CM | POA: Diagnosis not present

## 2019-05-30 DIAGNOSIS — M179 Osteoarthritis of knee, unspecified: Secondary | ICD-10-CM | POA: Diagnosis not present

## 2019-05-30 DIAGNOSIS — M545 Low back pain: Secondary | ICD-10-CM | POA: Diagnosis not present

## 2019-05-30 DIAGNOSIS — M47897 Other spondylosis, lumbosacral region: Secondary | ICD-10-CM | POA: Diagnosis not present

## 2019-05-30 DIAGNOSIS — M25559 Pain in unspecified hip: Secondary | ICD-10-CM | POA: Diagnosis not present

## 2019-06-01 ENCOUNTER — Other Ambulatory Visit: Payer: Self-pay | Admitting: Internal Medicine

## 2019-06-27 DIAGNOSIS — M5134 Other intervertebral disc degeneration, thoracic region: Secondary | ICD-10-CM | POA: Diagnosis not present

## 2019-06-27 DIAGNOSIS — M5136 Other intervertebral disc degeneration, lumbar region: Secondary | ICD-10-CM | POA: Diagnosis not present

## 2019-06-27 DIAGNOSIS — Z79891 Long term (current) use of opiate analgesic: Secondary | ICD-10-CM | POA: Diagnosis not present

## 2019-06-27 DIAGNOSIS — G894 Chronic pain syndrome: Secondary | ICD-10-CM | POA: Diagnosis not present

## 2019-07-04 ENCOUNTER — Ambulatory Visit (INDEPENDENT_AMBULATORY_CARE_PROVIDER_SITE_OTHER): Payer: Medicare Other | Admitting: Internal Medicine

## 2019-07-04 ENCOUNTER — Encounter: Payer: Self-pay | Admitting: Internal Medicine

## 2019-07-04 ENCOUNTER — Other Ambulatory Visit: Payer: Self-pay

## 2019-07-04 VITALS — BP 112/74 | HR 86 | Temp 98.0°F | Ht 64.6 in | Wt 260.4 lb

## 2019-07-04 DIAGNOSIS — Z0000 Encounter for general adult medical examination without abnormal findings: Secondary | ICD-10-CM | POA: Diagnosis not present

## 2019-07-04 DIAGNOSIS — K909 Intestinal malabsorption, unspecified: Secondary | ICD-10-CM

## 2019-07-04 DIAGNOSIS — E039 Hypothyroidism, unspecified: Secondary | ICD-10-CM

## 2019-07-04 DIAGNOSIS — Z6841 Body Mass Index (BMI) 40.0 and over, adult: Secondary | ICD-10-CM

## 2019-07-04 DIAGNOSIS — R7309 Other abnormal glucose: Secondary | ICD-10-CM | POA: Diagnosis not present

## 2019-07-04 DIAGNOSIS — G894 Chronic pain syndrome: Secondary | ICD-10-CM | POA: Diagnosis not present

## 2019-07-04 NOTE — Patient Instructions (Signed)
Health Maintenance, Female Adopting a healthy lifestyle and getting preventive care are important in promoting health and wellness. Ask your health care provider about:  The right schedule for you to have regular tests and exams.  Things you can do on your own to prevent diseases and keep yourself healthy. What should I know about diet, weight, and exercise? Eat a healthy diet   Eat a diet that includes plenty of vegetables, fruits, low-fat dairy products, and lean protein.  Do not eat a lot of foods that are high in solid fats, added sugars, or sodium. Maintain a healthy weight Body mass index (BMI) is used to identify weight problems. It estimates body fat based on height and weight. Your health care provider can help determine your BMI and help you achieve or maintain a healthy weight. Get regular exercise Get regular exercise. This is one of the most important things you can do for your health. Most adults should:  Exercise for at least 150 minutes each week. The exercise should increase your heart rate and make you sweat (moderate-intensity exercise).  Do strengthening exercises at least twice a week. This is in addition to the moderate-intensity exercise.  Spend less time sitting. Even light physical activity can be beneficial. Watch cholesterol and blood lipids Have your blood tested for lipids and cholesterol at 55 years of age, then have this test every 5 years. Have your cholesterol levels checked more often if:  Your lipid or cholesterol levels are high.  You are older than 55 years of age.  You are at high risk for heart disease. What should I know about cancer screening? Depending on your health history and family history, you may need to have cancer screening at various ages. This may include screening for:  Breast cancer.  Cervical cancer.  Colorectal cancer.  Skin cancer.  Lung cancer. What should I know about heart disease, diabetes, and high blood  pressure? Blood pressure and heart disease  High blood pressure causes heart disease and increases the risk of stroke. This is more likely to develop in people who have high blood pressure readings, are of African descent, or are overweight.  Have your blood pressure checked: ? Every 3-5 years if you are 18-39 years of age. ? Every year if you are 40 years old or older. Diabetes Have regular diabetes screenings. This checks your fasting blood sugar level. Have the screening done:  Once every three years after age 40 if you are at a normal weight and have a low risk for diabetes.  More often and at a younger age if you are overweight or have a high risk for diabetes. What should I know about preventing infection? Hepatitis B If you have a higher risk for hepatitis B, you should be screened for this virus. Talk with your health care provider to find out if you are at risk for hepatitis B infection. Hepatitis C Testing is recommended for:  Everyone born from 1945 through 1965.  Anyone with known risk factors for hepatitis C. Sexually transmitted infections (STIs)  Get screened for STIs, including gonorrhea and chlamydia, if: ? You are sexually active and are younger than 55 years of age. ? You are older than 55 years of age and your health care provider tells you that you are at risk for this type of infection. ? Your sexual activity has changed since you were last screened, and you are at increased risk for chlamydia or gonorrhea. Ask your health care provider if   you are at risk.  Ask your health care provider about whether you are at high risk for HIV. Your health care provider may recommend a prescription medicine to help prevent HIV infection. If you choose to take medicine to prevent HIV, you should first get tested for HIV. You should then be tested every 3 months for as long as you are taking the medicine. Pregnancy  If you are about to stop having your period (premenopausal) and  you may become pregnant, seek counseling before you get pregnant.  Take 400 to 800 micrograms (mcg) of folic acid every day if you become pregnant.  Ask for birth control (contraception) if you want to prevent pregnancy. Osteoporosis and menopause Osteoporosis is a disease in which the bones lose minerals and strength with aging. This can result in bone fractures. If you are 65 years old or older, or if you are at risk for osteoporosis and fractures, ask your health care provider if you should:  Be screened for bone loss.  Take a calcium or vitamin D supplement to lower your risk of fractures.  Be given hormone replacement therapy (HRT) to treat symptoms of menopause. Follow these instructions at home: Lifestyle  Do not use any products that contain nicotine or tobacco, such as cigarettes, e-cigarettes, and chewing tobacco. If you need help quitting, ask your health care provider.  Do not use street drugs.  Do not share needles.  Ask your health care provider for help if you need support or information about quitting drugs. Alcohol use  Do not drink alcohol if: ? Your health care provider tells you not to drink. ? You are pregnant, may be pregnant, or are planning to become pregnant.  If you drink alcohol: ? Limit how much you use to 0-1 drink a day. ? Limit intake if you are breastfeeding.  Be aware of how much alcohol is in your drink. In the [place] one drink equals one 12 oz bottle of beer (355 mL), one 5 oz glass of wine (148 mL), or one 1 oz glass of hard liquor (44 mL). General instructions  Schedule regular health, dental, and eye exams.  Stay current with your vaccines.  Tell your health care provider if: ? You often feel depressed. ? You have ever been abused or do not feel safe at home. Summary  Adopting a healthy lifestyle and getting preventive care are important in promoting health and wellness.  Follow your health care provider's instructions about healthy  diet, exercising, and getting tested or screened for diseases.  Follow your health care provider's instructions on monitoring your cholesterol and blood pressure. This information is not intended to replace advice given to you by your health care provider. Make sure you discuss any questions you have with your health care provider. Document Revised: 01/20/2018 Document Reviewed: 01/20/2018 [employer] Patient Education  2020 [employer]  

## 2019-07-04 NOTE — Progress Notes (Signed)
This visit occurred during the SARS-CoV-2 public health emergency.  Safety protocols were in place, including screening questions prior to the visit, additional usage of staff PPE, and extensive cleaning of exam room while observing appropriate contact time as indicated for disinfecting solutions.  Subjective:     Patient ID: Natalie Guerrero , female    DOB: 01-01-1966 , 54 y.o.   MRN: [id number]   Chief Complaint  Patient presents with  . Annual Exam    HPI  She is here today for a full physical exam. She is followed by GYN for her pelvic exams.     Past Medical History:  Diagnosis Date  . Anxiety   . Arthritis   . DDD (degenerative disc disease)   . Depression   . Hypothyroidism 12/08/2012  . Iron deficiency anemia, unspecified 11/10/2012  . Iron malabsorption 01/20/2017  . Obesity   . Thyroid disease hypothroidism     Family History  Problem Relation Age of Onset  . Prostate cancer Father   . Heart failure Mother   . Colon cancer Neg Hx   . Diabetes Neg Hx   . Kidney disease Neg Hx   . Liver disease Neg Hx      Current Outpatient Medications:  .  Biotin 10 MG CAPS, Take 30 mg by mouth daily. , Disp: , Rfl:  .  buPROPion (WELLBUTRIN XL) 150 MG 24 hr tablet, TAKE 1 TABLET BY MOUTH EVERY DAY, Disp: 90 tablet, Rfl: 1 .  Carboxymethylcellulose Sodium (REFRESH LIQUIGEL) 1 % GEL, Place 1-2 drops into both eyes 4 (four) times daily as needed (for dry eyes)., Disp: , Rfl:  .  Cholecalciferol (VITAMIN D3) 2000 units TABS, Take 10,000 Units by mouth daily., Disp: , Rfl:  .  COLLAGEN PO, Take 2 Scoops by mouth daily., Disp: , Rfl:  .  Cyanocobalamin (VITAMIN B-12) 5000 MCG SUBL, Take 10,000 mcg by mouth daily., Disp: , Rfl:  .  Dextromethorphan-guaiFENesin (MUCINEX DM MAXIMUM STRENGTH) 60-1200 MG TB12, Take 1 tablet by mouth 2 (two) times daily., Disp: , Rfl:  .  diphenhydrAMINE (BENADRYL) 25 mg capsule, Take 50 mg by mouth 2 (two) times daily., Disp: , Rfl:  .   docusate sodium (COLACE) 100 MG capsule, Take 200 mg by mouth 2 (two) times daily. , Disp: , Rfl:  .  FIBER PO, Take 4 capsules by mouth daily. prn, Disp: , Rfl:  .  Glucosamine-Chondroit-Vit C-Mn (GLUCOSAMINE 1500 COMPLEX PO), Take 4 tablets by mouth daily., Disp: , Rfl:  .  ibuprofen (ADVIL) 800 MG tablet, Take 800 mg by mouth 3 (three) times daily as needed., Disp: , Rfl:  .  imipramine (TOFRANIL) 25 MG tablet, Take 25 mg by mouth 3 (three) times daily. 3 tabs 2 times per day, Disp: , Rfl: 4 .  levothyroxine (SYNTHROID) 100 MCG tablet, TAKE 1 TABLET (100 MCG TOTAL) BY MOUTH DAILY BEFORE BREAKFAST., Disp: 90 tablet, Rfl: 1 .  Liniments (SALONPAS PAIN RELIEF PATCH EX), Apply 1 patch topically daily., Disp: , Rfl:  .  MAGNESIUM PO, Take 5 tablets by mouth daily. , Disp: , Rfl:  .  omeprazole (PRILOSEC) 20 MG capsule, Take 20 mg by mouth. 2 times per day, Disp: , Rfl:  .  OVER THE COUNTER MEDICATION, Apply 1 application topically 2 (two) times daily. Beck and Blackley Arthritis Cream, Disp: , Rfl:  .  oxyCODONE (ROXICODONE) 15 MG immediate release tablet, every 4 (four) hours as needed. 1, Disp: , Rfl:  .  Polyethyl Glycol-Propyl Glycol (SYSTANE ULTRA) 0.4-0.3 % SOLN, Place 2 drops into both eyes 2 (two) times daily as needed (for dry eyes)., Disp: , Rfl:  .  topiramate (TOPAMAX) 100 MG tablet, Take 100 mg by mouth 4 (four) times daily. , Disp: , Rfl: 5 .  Turmeric Curcumin 500 MG CAPS, Take 1,000 mg by mouth 2 (two) times daily., Disp: , Rfl:  .  ZINC-VITAMIN C PO, Take 6 tablets by mouth daily., Disp: , Rfl:  .  tiZANidine (ZANAFLEX) 4 MG tablet, , Disp: , Rfl:  .  valACYclovir (VALTREX) 500 MG tablet, SMARTSIG:1 Tablet(s) By Mouth Every 12 Hours PRN, Disp: , Rfl:    Allergies  Allergen Reactions  . Codeine Itching     The patient states she uses status post hysterectomy for birth control. Last LMP was No LMP recorded. Patient has had a hysterectomy.. Negative for Dysmenorrhea  Negative for:  breast discharge, breast lump(s), breast pain and breast self exam. Associated symptoms include abnormal vaginal bleeding. Pertinent negatives include abnormal bleeding (hematology), anxiety, decreased libido, depression, difficulty falling sleep, dyspareunia, history of infertility, nocturia, sexual dysfunction, sleep disturbances, urinary incontinence, urinary urgency, vaginal discharge and vaginal itching. Diet regular.The patient states her exercise level is    . The patient's tobacco use is:  Social History   Tobacco Use  Smoking Status Never Smoker  Smokeless Tobacco Never Used  . She has been exposed to passive smoke. The patient's alcohol use is:  Social History   Substance and Sexual Activity  Alcohol Use Not Currently    Review of Systems  Constitutional: Negative.   HENT: Negative.   Eyes: Negative.   Respiratory: Negative.   Cardiovascular: Negative.   Endocrine: Negative.   Genitourinary: Negative.   Musculoskeletal: Negative.   Skin: Negative.   Allergic/Immunologic: Negative.   Neurological: Negative.   Hematological: Negative.   Psychiatric/Behavioral: Negative.      Today's Vitals   07/04/19 1027  BP: 112/74  Pulse: 86  Temp: 98 F (36.7 C)  TempSrc: Oral  Weight: 260 lb 6.4 oz (118.1 kg)  Height: 5' 4.6" (1.641 m)  PainSc: 3   PainLoc: Back   Body mass index is 43.87 kg/m.   Wt Readings from Last 3 Encounters:  07/04/19 260 lb 6.4 oz (118.1 kg)  11/08/18 279 lb 9.6 oz (126.8 kg)  11/02/18 285 lb 6.4 oz (129.5 kg)     Objective:  Physical Exam Vitals and nursing note reviewed.  Constitutional:      Appearance: Normal appearance. She is obese.  HENT:     Head: Normocephalic and atraumatic.     Right Ear: Tympanic membrane, ear canal and external ear normal.     Left Ear: Tympanic membrane, ear canal and external ear normal.     Nose:     Comments: Deferred, masked    Mouth/Throat:     Comments: Deferred, masked Eyes:     Extraocular  Movements: Extraocular movements intact.     Conjunctiva/sclera: Conjunctivae normal.     Pupils: Pupils are equal, round, and reactive to light.  Cardiovascular:     Rate and Rhythm: Normal rate and regular rhythm.     Pulses: Normal pulses.     Heart sounds: Normal heart sounds.  Pulmonary:     Effort: Pulmonary effort is normal.     Breath sounds: Normal breath sounds.  Chest:     Breasts:        Right: Normal.  Left: Normal.  Abdominal:     General: Abdomen is flat. Bowel sounds are normal.     Palpations: Abdomen is soft.  Genitourinary:    Comments: deferred Musculoskeletal:        General: Normal range of motion.     Cervical back: Normal range of motion and neck supple.  Skin:    General: Skin is warm and dry.  Neurological:     General: No focal deficit present.     Mental Status: She is alert and oriented to person, place, and time.  Psychiatric:        Mood and Affect: Mood normal.        Behavior: Behavior normal.         Assessment And Plan:     1. Routine general medical examination at health care facility  A full exam was performed. Importance of monthly self breast exams was discussed witht he patient.  PATIENT IS ADVISED TO GET 30-45 MINUTES REGULAR EXERCISE NO LESS THAN FOUR TO FIVE DAYS PER WEEK - BOTH WEIGHTBEARING EXERCISES AND AEROBIC ARE RECOMMENDED.  HE/SHE IS ADVISED TO FOLLOW A HEALTHY DIET WITH AT LEAST SIX FRUITS/VEGGIES PER DAY, DECREASE INTAKE OF RED MEAT, AND TO INCREASE FISH INTAKE TO TWO DAYS PER WEEK.  MEATS/FISH SHOULD NOT BE FRIED, BAKED OR BROILED IS PREFERABLE.  I SUGGEST WEARING SPF 50 SUNSCREEN ON EXPOSED PARTS AND ESPECIALLY WHEN IN THE DIRECT SUNLIGHT FOR AN EXTENDED PERIOD OF TIME.  PLEASE AVOID FAST FOOD RESTAURANTS AND INCREASE YOUR WATER INTAKE.   2. Primary hypothyroidism  I will check thyroid panel and adjust meds as needed.  - Lipid panel - Hemoglobin A1c - TSH - T4, free  3. Chronic pain syndrome  Chronic, sx  are stable on current regimen.   4. Iron malabsorption  Chronic, s/p gastric bypass. I will check CBC and iron levels today. She has been seen by Hematology in the past for iron infusions. Since her last Hematology visit, she has incorporated beet juice into her daily routine and she no longer exhibits sx of pica.   - CMP14+EGFR - CBC - Iron and IBC ([id number]) - Ferritin  5. Class 3 severe obesity due to excess calories with serious comorbidity and body mass index (BMI) of 40.0 to 44.9 in adult [facility])  She was congratulated on her 25 pound weight loss since her last visit. She is encouraged to keep up the great work!   [name], MD    THE PATIENT IS ENCOURAGED TO PRACTICE SOCIAL DISTANCING DUE TO THE COVID-19 PANDEMIC.

## 2019-07-05 LAB — CMP14+EGFR
ALT: 18 IU/L (ref 0–32)
AST: 24 IU/L (ref 0–40)
Albumin/Globulin Ratio: 1.5 (ref 1.2–2.2)
Albumin: 3.9 g/dL (ref 3.8–4.9)
Alkaline Phosphatase: 100 IU/L (ref 48–121)
BUN/Creatinine Ratio: 11 (ref 9–23)
BUN: 10 mg/dL (ref 6–24)
Bilirubin Total: 0.2 mg/dL (ref 0.0–1.2)
CO2: 19 mmol/L — ABNORMAL LOW (ref 20–29)
Calcium: 8.9 mg/dL (ref 8.7–10.2)
Chloride: 108 mmol/L — ABNORMAL HIGH (ref 96–106)
Creatinine, Ser: 0.92 mg/dL (ref 0.57–1.00)
GFR calc Af Amer: 82 mL/min/{1.73_m2} (ref >59)
GFR calc non Af Amer: 71 mL/min/{1.73_m2} (ref >59)
Globulin, Total: 2.6 g/dL (ref 1.5–4.5)
Glucose: 84 mg/dL (ref 65–99)
Potassium: 4.3 mmol/L (ref 3.5–5.2)
Sodium: 140 mmol/L (ref 134–144)
Total Protein: 6.5 g/dL (ref 6.0–8.5)

## 2019-07-05 LAB — CBC
Hematocrit: 33.2 % — ABNORMAL LOW (ref 34.0–46.6)
Hemoglobin: 10.8 g/dL — ABNORMAL LOW (ref 11.1–15.9)
MCH: 31.2 pg (ref 26.6–33.0)
MCHC: 32.5 g/dL (ref 31.5–35.7)
MCV: 96 fL (ref 79–97)
Platelets: 334 10*3/uL (ref 150–450)
RBC: 3.46 x10E6/uL — ABNORMAL LOW (ref 3.77–5.28)
RDW: 11.9 % (ref 11.7–15.4)
WBC: 4.1 10*3/uL (ref 3.4–10.8)

## 2019-07-05 LAB — LIPID PANEL
Chol/HDL Ratio: 2.2 ratio (ref 0.0–4.4)
Cholesterol, Total: 171 mg/dL (ref 100–199)
HDL: 78 mg/dL (ref >39)
LDL Chol Calc (NIH): 82 mg/dL (ref 0–99)
Triglycerides: 57 mg/dL (ref 0–149)
VLDL Cholesterol Cal: 11 mg/dL (ref 5–40)

## 2019-07-05 LAB — IRON AND TIBC
Iron Saturation: 35 % (ref 15–55)
Iron: 81 ug/dL (ref 27–159)
Total Iron Binding Capacity: 232 ug/dL — ABNORMAL LOW (ref 250–450)
UIBC: 151 ug/dL (ref 131–425)

## 2019-07-05 LAB — FERRITIN: Ferritin: 27 ng/mL (ref 15–150)

## 2019-07-05 LAB — HEMOGLOBIN A1C
Est. average glucose Bld gHb Est-mCnc: 105 mg/dL
Hgb A1c MFr Bld: 5.3 % (ref 4.8–5.6)

## 2019-07-05 LAB — TSH: TSH: 0.107 u[IU]/mL — ABNORMAL LOW (ref 0.450–4.500)

## 2019-07-05 LAB — T4, FREE: Free T4: 1.35 ng/dL (ref 0.82–1.77)

## 2019-07-25 DIAGNOSIS — Z79891 Long term (current) use of opiate analgesic: Secondary | ICD-10-CM | POA: Diagnosis not present

## 2019-07-25 DIAGNOSIS — M5136 Other intervertebral disc degeneration, lumbar region: Secondary | ICD-10-CM | POA: Diagnosis not present

## 2019-07-25 DIAGNOSIS — M545 Low back pain: Secondary | ICD-10-CM | POA: Diagnosis not present

## 2019-07-25 DIAGNOSIS — G894 Chronic pain syndrome: Secondary | ICD-10-CM | POA: Diagnosis not present

## 2019-07-26 DIAGNOSIS — M47816 Spondylosis without myelopathy or radiculopathy, lumbar region: Secondary | ICD-10-CM | POA: Diagnosis not present

## 2019-07-26 DIAGNOSIS — G894 Chronic pain syndrome: Secondary | ICD-10-CM | POA: Diagnosis not present

## 2019-07-26 DIAGNOSIS — M533 Sacrococcygeal disorders, not elsewhere classified: Secondary | ICD-10-CM | POA: Diagnosis not present

## 2019-08-17 ENCOUNTER — Encounter: Payer: Self-pay | Admitting: Internal Medicine

## 2019-08-23 DIAGNOSIS — M533 Sacrococcygeal disorders, not elsewhere classified: Secondary | ICD-10-CM | POA: Diagnosis not present

## 2019-08-24 DIAGNOSIS — G894 Chronic pain syndrome: Secondary | ICD-10-CM | POA: Diagnosis not present

## 2019-08-24 DIAGNOSIS — Z79891 Long term (current) use of opiate analgesic: Secondary | ICD-10-CM | POA: Diagnosis not present

## 2019-08-24 DIAGNOSIS — M5136 Other intervertebral disc degeneration, lumbar region: Secondary | ICD-10-CM | POA: Diagnosis not present

## 2019-08-24 DIAGNOSIS — M545 Low back pain: Secondary | ICD-10-CM | POA: Diagnosis not present

## 2019-08-31 IMAGING — US US ABDOMEN LIMITED
1 series · 14 of 25 positions shown · non-contrast
Comparison: None.

CLINICAL DATA: Epigastric pain

EXAM:
ULTRASOUND ABDOMEN LIMITED RIGHT UPPER QUADRANT

[Series 1: us abdomen limited · 0.15mm/px · 14 of 68 slices shown]
[im 1/68]
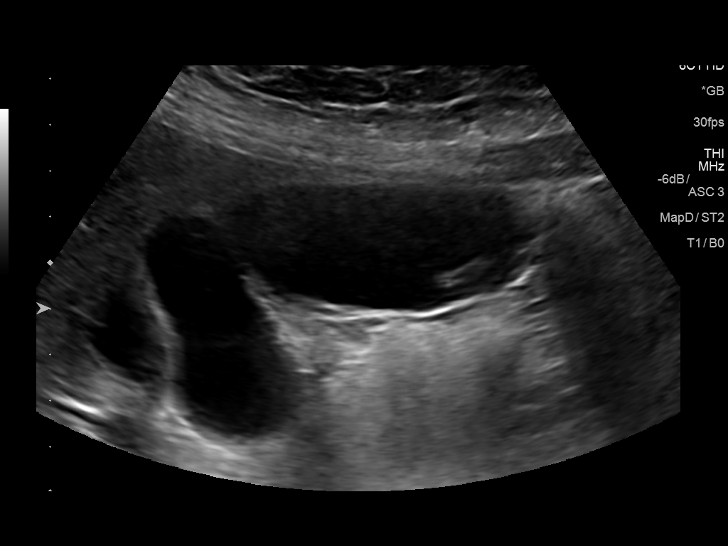
[im 6/68]
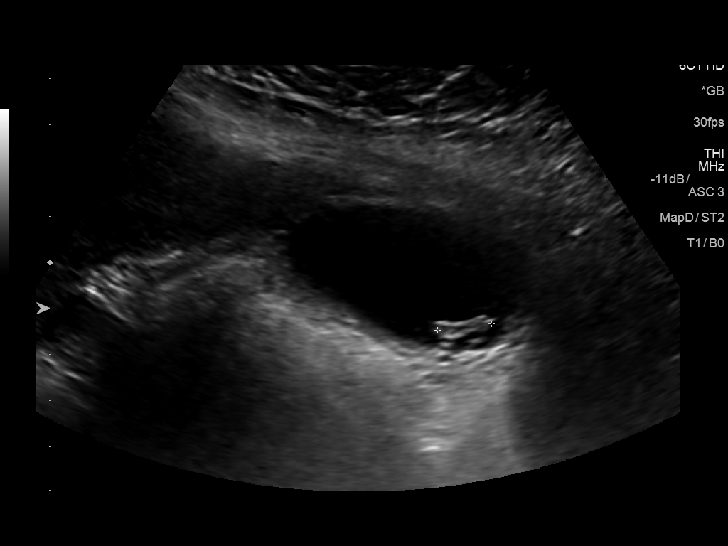
[im 12/68]
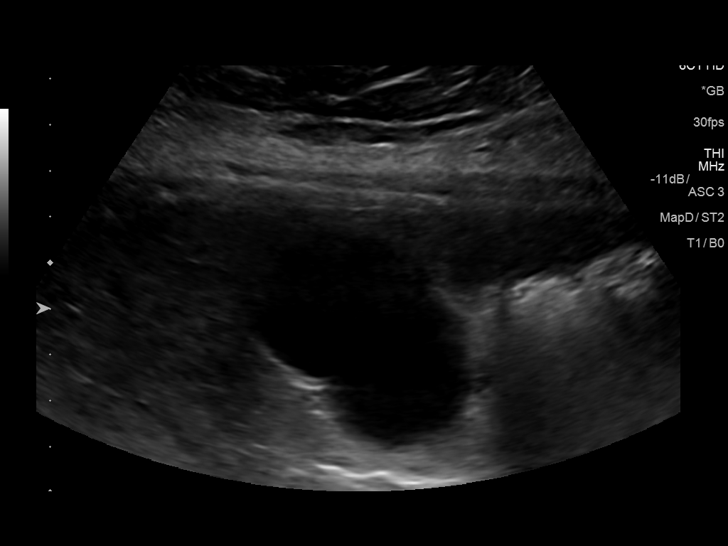
[im 17/68]
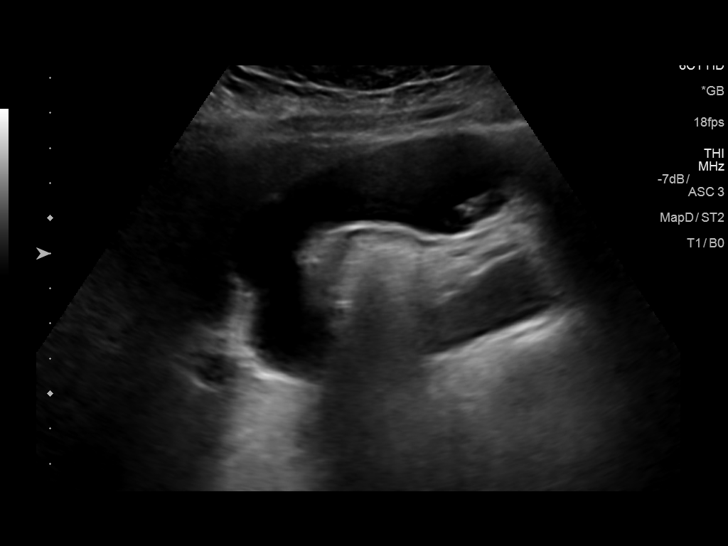
[im 23/68]
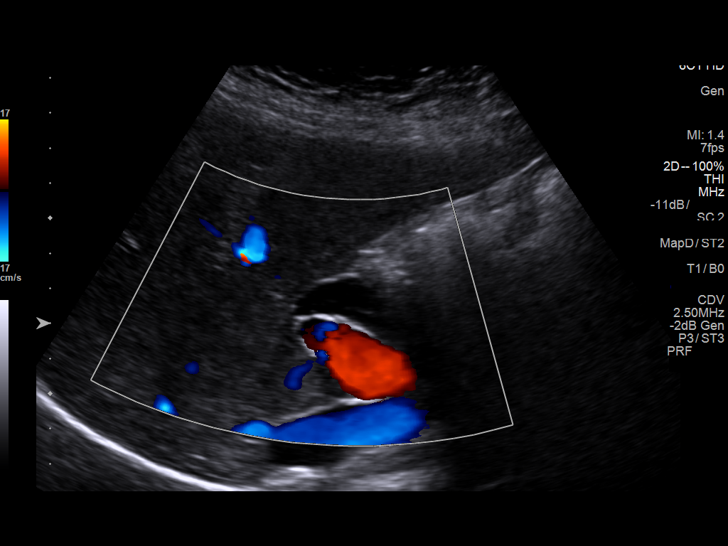
[im 26/68]
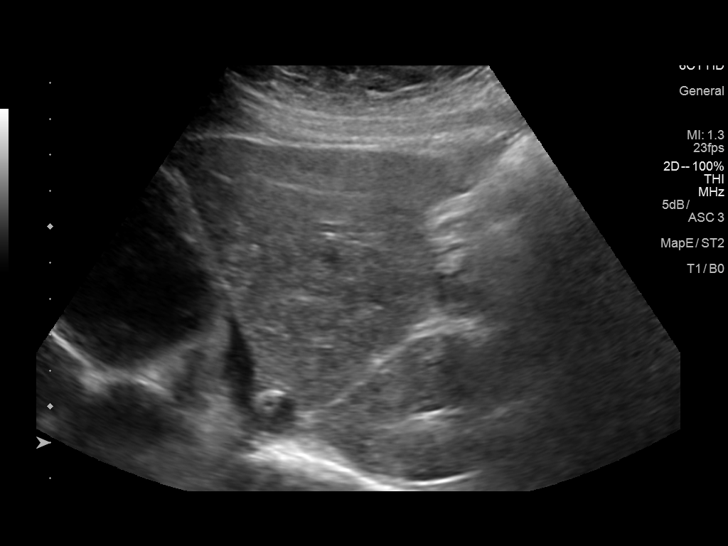
[im 31/68]
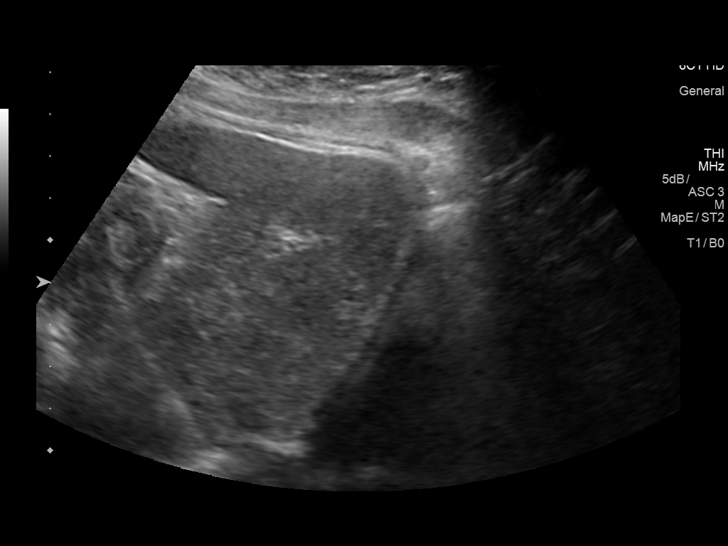
[im 37/68]
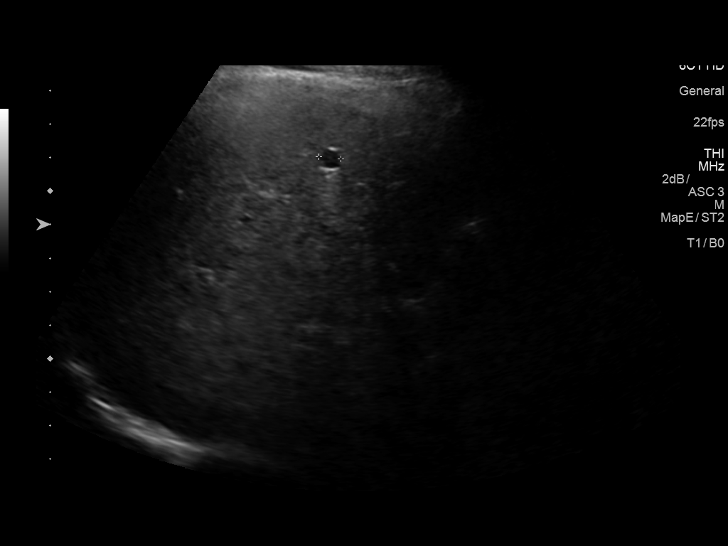
[im 42/68]
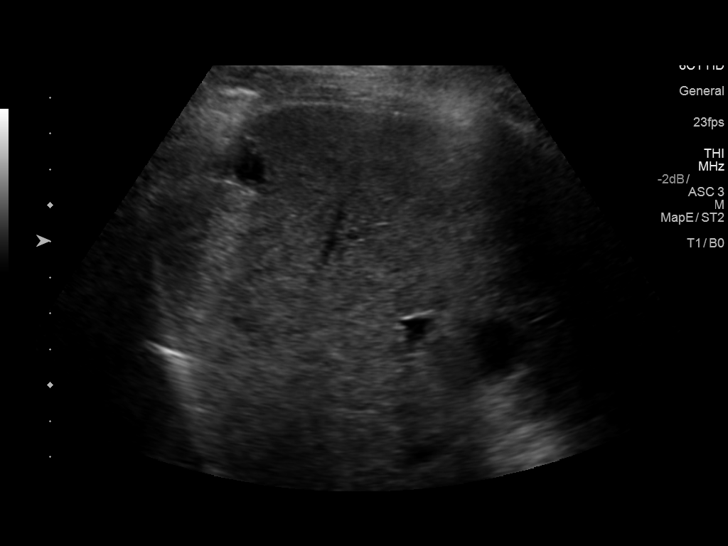
[im 45/68]
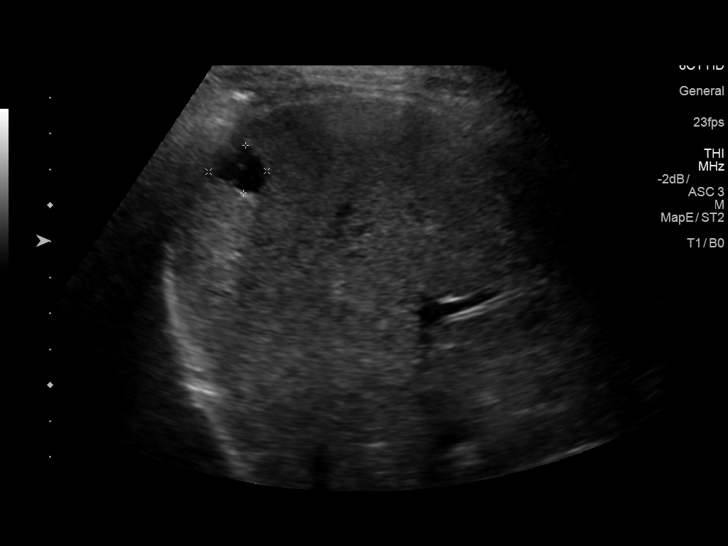
[im 51/68]
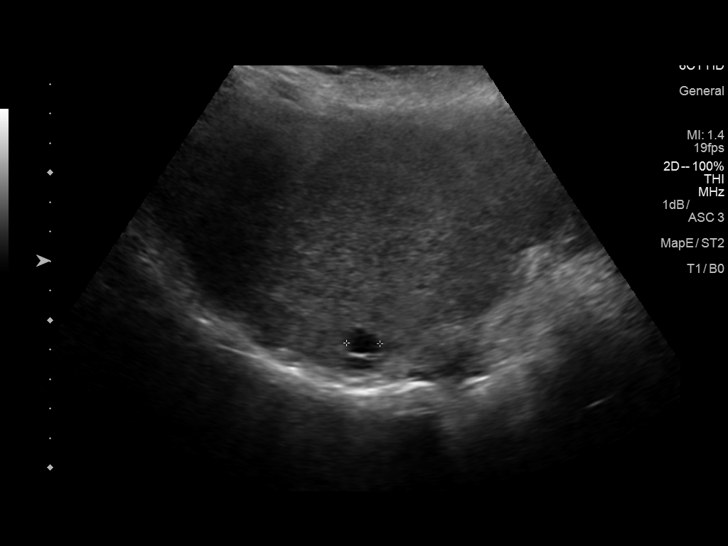
[im 56/68]
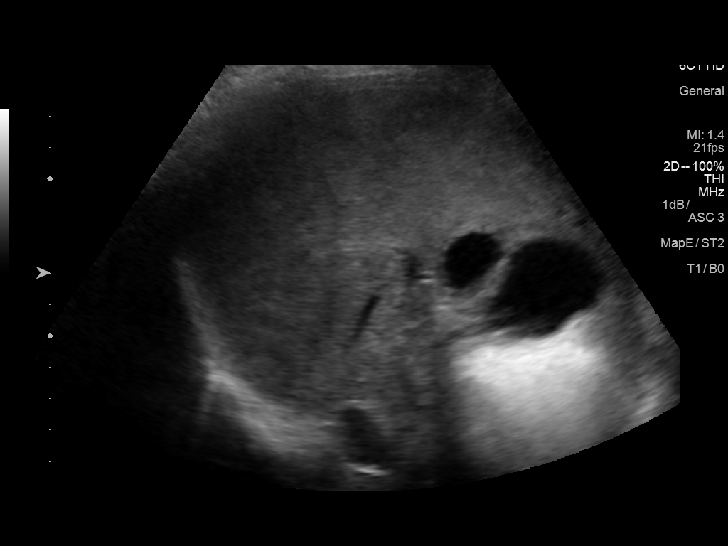
[im 62/68]
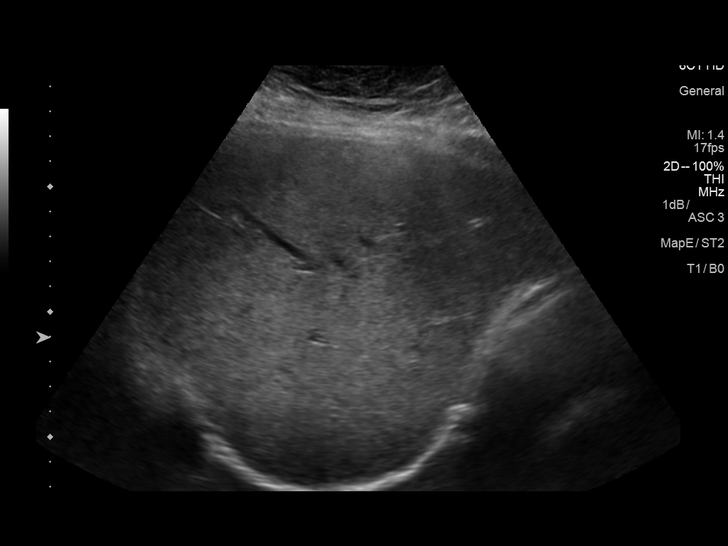
[im 68/68]
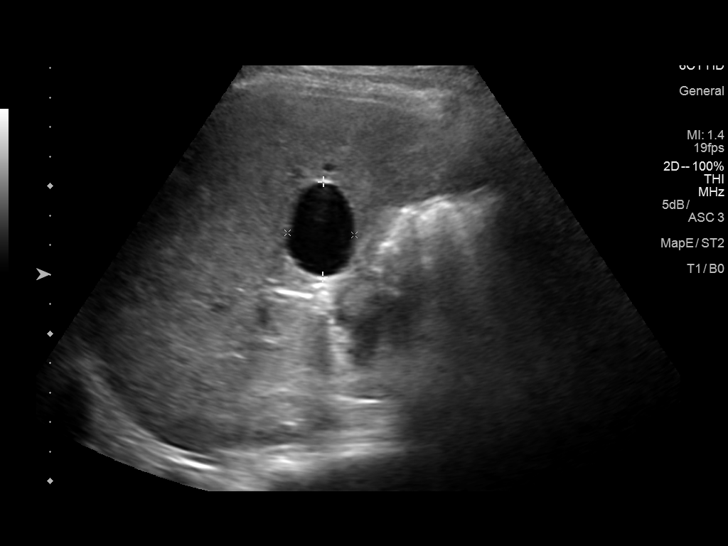

[14 of 25 positions shown; findings below may reference images not displayed]

FINDINGS: Gallbladder:

1.2 cm gallstone within the gallbladder, mobile. No wall thickening
or sonographic Murphy sign.

Common bile duct:

Diameter: Dilated, 12 mm.The distal duct is not well visualized due
to overlying bowel gas.

Liver:

Multiple cysts in the liver, the largest 3.2 cm in the right hepatic
lobe. No suspicious hepatic lesion. Mildly heterogeneous
echotexture. No biliary ductal dilatation. Portal vein is patent on
color Doppler imaging with normal direction of blood flow towards
the liver.
IMPRESSION: Cholelithiasis.  No sonographic evidence of acute cholecystitis.

Dilated common bowel duct. The distal duct cannot be visualized due
to overlying bowel gas. This could be further evaluated with MRCP or
ERCP to exclude distal obstructing stone or mass.

Benign-appearing hepatic cysts.

## 2019-09-13 ENCOUNTER — Ambulatory Visit: Payer: Medicare Other | Admitting: Internal Medicine

## 2019-09-13 ENCOUNTER — Ambulatory Visit: Payer: Medicare Other

## 2019-09-14 ENCOUNTER — Ambulatory Visit: Payer: Medicare Other | Admitting: Internal Medicine

## 2019-09-14 ENCOUNTER — Ambulatory Visit: Payer: Medicare Other

## 2019-09-21 DIAGNOSIS — G894 Chronic pain syndrome: Secondary | ICD-10-CM | POA: Diagnosis not present

## 2019-09-22 ENCOUNTER — Other Ambulatory Visit: Payer: Self-pay | Admitting: Nurse Practitioner

## 2019-09-25 DIAGNOSIS — G894 Chronic pain syndrome: Secondary | ICD-10-CM | POA: Diagnosis not present

## 2019-10-06 ENCOUNTER — Other Ambulatory Visit: Payer: Self-pay | Admitting: Internal Medicine

## 2019-10-06 DIAGNOSIS — Z1231 Encounter for screening mammogram for malignant neoplasm of breast: Secondary | ICD-10-CM

## 2019-10-19 DIAGNOSIS — I82409 Acute embolism and thrombosis of unspecified deep veins of unspecified lower extremity: Secondary | ICD-10-CM | POA: Diagnosis not present

## 2019-10-19 DIAGNOSIS — Z8249 Family history of ischemic heart disease and other diseases of the circulatory system: Secondary | ICD-10-CM | POA: Diagnosis not present

## 2019-10-19 DIAGNOSIS — M5136 Other intervertebral disc degeneration, lumbar region: Secondary | ICD-10-CM | POA: Diagnosis not present

## 2019-10-19 DIAGNOSIS — I119 Hypertensive heart disease without heart failure: Secondary | ICD-10-CM | POA: Diagnosis not present

## 2019-10-19 DIAGNOSIS — M545 Low back pain: Secondary | ICD-10-CM | POA: Diagnosis not present

## 2019-10-19 DIAGNOSIS — G8929 Other chronic pain: Secondary | ICD-10-CM | POA: Diagnosis not present

## 2019-10-19 DIAGNOSIS — M179 Osteoarthritis of knee, unspecified: Secondary | ICD-10-CM | POA: Diagnosis not present

## 2019-10-19 DIAGNOSIS — M25559 Pain in unspecified hip: Secondary | ICD-10-CM | POA: Diagnosis not present

## 2019-10-19 DIAGNOSIS — G894 Chronic pain syndrome: Secondary | ICD-10-CM | POA: Diagnosis not present

## 2019-10-19 DIAGNOSIS — R609 Edema, unspecified: Secondary | ICD-10-CM | POA: Diagnosis not present

## 2019-10-19 DIAGNOSIS — M47897 Other spondylosis, lumbosacral region: Secondary | ICD-10-CM | POA: Diagnosis not present

## 2019-10-26 DIAGNOSIS — M47816 Spondylosis without myelopathy or radiculopathy, lumbar region: Secondary | ICD-10-CM | POA: Diagnosis not present

## 2019-11-11 ENCOUNTER — Other Ambulatory Visit: Payer: Self-pay

## 2019-11-11 ENCOUNTER — Other Ambulatory Visit (HOSPITAL_COMMUNITY): Payer: Self-pay | Admitting: Physical Medicine and Rehabilitation

## 2019-11-11 ENCOUNTER — Ambulatory Visit (HOSPITAL_COMMUNITY)
Admission: RE | Admit: 2019-11-11 | Discharge: 2019-11-11 | Disposition: A | Discharge status: Home / Self Care | Payer: Medicare Other | Source: Ambulatory Visit | Attending: Internal Medicine | Admitting: Internal Medicine

## 2019-11-11 DIAGNOSIS — R0602 Shortness of breath: Secondary | ICD-10-CM

## 2019-11-11 DIAGNOSIS — M533 Sacrococcygeal disorders, not elsewhere classified: Secondary | ICD-10-CM | POA: Diagnosis not present

## 2019-11-11 DIAGNOSIS — M79604 Pain in right leg: Secondary | ICD-10-CM | POA: Diagnosis not present

## 2019-11-11 NOTE — Progress Notes (Signed)
Lower extremity venous RT study completed.   Please see CV Proc for preliminary results.   Verdon Ferrante, RDMS  

## 2019-11-12 ENCOUNTER — Other Ambulatory Visit: Payer: Self-pay | Admitting: Internal Medicine

## 2019-11-15 DIAGNOSIS — G894 Chronic pain syndrome: Secondary | ICD-10-CM | POA: Diagnosis not present

## 2019-11-15 DIAGNOSIS — M5136 Other intervertebral disc degeneration, lumbar region: Secondary | ICD-10-CM | POA: Diagnosis not present

## 2019-11-15 DIAGNOSIS — Z79891 Long term (current) use of opiate analgesic: Secondary | ICD-10-CM | POA: Diagnosis not present

## 2019-11-24 DIAGNOSIS — G894 Chronic pain syndrome: Secondary | ICD-10-CM | POA: Diagnosis not present

## 2019-12-06 ENCOUNTER — Other Ambulatory Visit: Payer: Self-pay | Admitting: Internal Medicine

## 2019-12-06 ENCOUNTER — Encounter: Payer: Self-pay | Admitting: Internal Medicine

## 2019-12-06 DIAGNOSIS — D509 Iron deficiency anemia, unspecified: Secondary | ICD-10-CM

## 2019-12-06 MED ORDER — OMEPRAZOLE 40 MG PO CPDR: 40.0000 mg | DELAYED_RELEASE_CAPSULE | Freq: Every day | ORAL | 1 refills | Status: DC

## 2019-12-13 DIAGNOSIS — M533 Sacrococcygeal disorders, not elsewhere classified: Secondary | ICD-10-CM | POA: Diagnosis not present

## 2019-12-13 DIAGNOSIS — M5136 Other intervertebral disc degeneration, lumbar region: Secondary | ICD-10-CM | POA: Diagnosis not present

## 2019-12-13 DIAGNOSIS — Z79891 Long term (current) use of opiate analgesic: Secondary | ICD-10-CM | POA: Diagnosis not present

## 2019-12-13 DIAGNOSIS — G894 Chronic pain syndrome: Secondary | ICD-10-CM | POA: Diagnosis not present

## 2019-12-13 DIAGNOSIS — M179 Osteoarthritis of knee, unspecified: Secondary | ICD-10-CM | POA: Diagnosis not present

## 2019-12-21 ENCOUNTER — Other Ambulatory Visit: Payer: Self-pay | Admitting: Internal Medicine

## 2019-12-24 DIAGNOSIS — G894 Chronic pain syndrome: Secondary | ICD-10-CM | POA: Diagnosis not present

## 2019-12-27 DIAGNOSIS — H40013 Open angle with borderline findings, low risk, bilateral: Secondary | ICD-10-CM | POA: Diagnosis not present

## 2019-12-28 ENCOUNTER — Other Ambulatory Visit: Payer: Self-pay

## 2019-12-28 ENCOUNTER — Ambulatory Visit (INDEPENDENT_AMBULATORY_CARE_PROVIDER_SITE_OTHER): Payer: Medicare Other | Admitting: Internal Medicine

## 2019-12-28 ENCOUNTER — Encounter: Payer: Self-pay | Admitting: Internal Medicine

## 2019-12-28 ENCOUNTER — Ambulatory Visit (INDEPENDENT_AMBULATORY_CARE_PROVIDER_SITE_OTHER): Payer: Medicare Other

## 2019-12-28 VITALS — BP 134/76 | HR 71 | Temp 98.0°F | Ht 64.6 in | Wt 261.0 lb

## 2019-12-28 VITALS — Temp 98.0°F | Ht 64.6 in | Wt 261.6 lb

## 2019-12-28 DIAGNOSIS — R011 Cardiac murmur, unspecified: Secondary | ICD-10-CM

## 2019-12-28 DIAGNOSIS — Z6841 Body Mass Index (BMI) 40.0 and over, adult: Secondary | ICD-10-CM

## 2019-12-28 DIAGNOSIS — Z23 Encounter for immunization: Secondary | ICD-10-CM | POA: Diagnosis not present

## 2019-12-28 DIAGNOSIS — E039 Hypothyroidism, unspecified: Secondary | ICD-10-CM

## 2019-12-28 DIAGNOSIS — D509 Iron deficiency anemia, unspecified: Secondary | ICD-10-CM

## 2019-12-28 DIAGNOSIS — Z0000 Encounter for general adult medical examination without abnormal findings: Secondary | ICD-10-CM | POA: Diagnosis not present

## 2019-12-28 DIAGNOSIS — R42 Dizziness and giddiness: Secondary | ICD-10-CM

## 2019-12-28 DIAGNOSIS — F331 Major depressive disorder, recurrent, moderate: Secondary | ICD-10-CM

## 2019-12-28 DIAGNOSIS — M461 Sacroiliitis, not elsewhere classified: Secondary | ICD-10-CM

## 2019-12-28 NOTE — Progress Notes (Signed)
I,Katawbba Wiggins,acting as a Education administrator for [name], MD.,have documented all relevant documentation on the behalf of [name], MD,as directed by  [name], MD while in the presence of [name], MD.  This visit occurred during the SARS-CoV-2 public health emergency.  Safety protocols were in place, including screening questions prior to the visit, additional usage of staff PPE, and extensive cleaning of exam room while observing appropriate contact time as indicated for disinfecting solutions.  Subjective:     Patient ID: Natalie Guerrero , female    DOB: 20-Jun-1965 , 54 y.o.   MRN: [id number]   Chief Complaint  Patient presents with  . Hypothyroidism    HPI  The patient is here today for a thyroid follow-up.  She reports compliance with levothyroxine supplementation.   Thyroid Problem Presents for follow-up visit. Patient reports no diarrhea, dry skin, hoarse voice, menstrual problem or palpitations.     Past Medical History:  Diagnosis Date  . Anxiety   . Arthritis   . DDD (degenerative disc disease)   . Depression   . Hypothyroidism 12/08/2012  . Iron deficiency anemia, unspecified 11/10/2012  . Iron malabsorption 01/20/2017  . Obesity   . Thyroid disease hypothroidism     Family History  Problem Relation Age of Onset  . Prostate cancer Father   . Heart failure Mother   . Colon cancer Neg Hx   . Diabetes Neg Hx   . Kidney disease Neg Hx   . Liver disease Neg Hx      Current Outpatient Medications:  .  Biotin 10 MG CAPS, Take 30 mg by mouth daily. , Disp: , Rfl:  .  buPROPion (WELLBUTRIN XL) 150 MG 24 hr tablet, TAKE 1 TABLET BY MOUTH EVERY DAY, Disp: 90 tablet, Rfl: 1 .  Carboxymethylcellulose Sodium (REFRESH LIQUIGEL) 1 % GEL, Place 1-2 drops into both eyes 4 (four) times daily as needed (for dry eyes)., Disp: , Rfl:  .  Cholecalciferol (VITAMIN D3) 2000 units TABS, Take 10,000 Units by mouth daily., Disp: , Rfl:  .  COLLAGEN  PO, Take 2 Scoops by mouth daily., Disp: , Rfl:  .  Cyanocobalamin (VITAMIN B-12) 5000 MCG SUBL, Take 10,000 mcg by mouth daily., Disp: , Rfl:  .  Dextromethorphan-guaiFENesin (MUCINEX DM MAXIMUM STRENGTH) 60-1200 MG TB12, Take 1 tablet by mouth 2 (two) times daily., Disp: , Rfl:  .  diphenhydrAMINE (BENADRYL) 25 mg capsule, Take 50 mg by mouth 2 (two) times daily., Disp: , Rfl:  .  docusate sodium (COLACE) 100 MG capsule, Take 200 mg by mouth 2 (two) times daily. , Disp: , Rfl:  .  FIBER PO, Take 4 capsules by mouth daily. prn, Disp: , Rfl:  .  ibuprofen (ADVIL) 800 MG tablet, Take 800 mg by mouth 3 (three) times daily as needed., Disp: , Rfl:  .  imipramine (TOFRANIL) 25 MG tablet, Take 25 mg by mouth 3 (three) times daily. 3 tabs 2 times per day, Disp: , Rfl: 4 .  levothyroxine (SYNTHROID) 100 MCG tablet, TAKE 1 TABLET (100 MCG TOTAL) BY MOUTH DAILY BEFORE BREAKFAST. (Patient taking differently: Take 100 mcg by mouth daily before breakfast. take one tablet M-F and take 1/2 pill on Saturday/Sunday.  ), Disp: 90 tablet, Rfl: 1 .  Liniments (SALONPAS PAIN RELIEF PATCH EX), Apply 1 patch topically daily., Disp: , Rfl:  .  MAGNESIUM PO, Take 5 tablets by mouth daily. , Disp: , Rfl:  .  omeprazole (PRILOSEC)  40 MG capsule, Take 1 capsule (40 mg total) by mouth daily., Disp: 90 capsule, Rfl: 1 .  OVER THE COUNTER MEDICATION, Apply 1 application topically 2 (two) times daily. Beck and Blackley Arthritis Cream, Disp: , Rfl:  .  oxyCODONE (ROXICODONE) 15 MG immediate release tablet, every 4 (four) hours as needed. 1, Disp: , Rfl:  .  Polyethyl Glycol-Propyl Glycol (SYSTANE ULTRA) 0.4-0.3 % SOLN, Place 2 drops into both eyes 2 (two) times daily as needed (for dry eyes)., Disp: , Rfl:  .  tiZANidine (ZANAFLEX) 4 MG tablet, , Disp: , Rfl:  .  topiramate (TOPAMAX) 100 MG tablet, Take 100 mg by mouth 4 (four) times daily. , Disp: , Rfl: 5 .  Turmeric Curcumin 500 MG CAPS, Take 1,000 mg by mouth 2 (two) times  daily., Disp: , Rfl:  .  valACYclovir (VALTREX) 500 MG tablet, SMARTSIG:1 Tablet(s) By Mouth Every 12 Hours PRN, Disp: , Rfl:  .  Varenicline Tartrate (TYRVAYA) 0.03 MG/ACT SOLN, Place into the nose. 2 times per day each nostril, Disp: , Rfl:  .  ZINC-VITAMIN C PO, Take 6 tablets by mouth daily., Disp: , Rfl:  .  CALCIUM+D3 600-800 MG-UNIT TABS, Take by mouth. 2 capsules daily, Disp: , Rfl:    Allergies  Allergen Reactions  . Codeine Itching     Review of Systems  Constitutional: Negative.   HENT: Negative for hoarse voice.   Respiratory: Negative.   Cardiovascular: Negative.  Negative for palpitations.  Gastrointestinal: Negative.  Negative for diarrhea.  Genitourinary: Negative for menstrual problem.  Neurological: Positive for dizziness.       She c/o intermittent dizziness. Usually occurs w/ positional changes. Denies change in eating habits.   Psychiatric/Behavioral: Negative.   All other systems reviewed and are negative.    Today's Vitals   12/28/19 1200  Temp: 98 F (36.7 C)  TempSrc: Oral  Weight: 261 lb 9.6 oz (118.7 kg)  Height: 5' 4.6" (1.641 m)   Body mass index is 44.07 kg/m.  Wt Readings from Last 3 Encounters:  12/28/19 261 lb (118.4 kg)  12/28/19 261 lb 9.6 oz (118.7 kg)  07/04/19 260 lb 6.4 oz (118.1 kg)   Objective:  Physical Exam Vitals and nursing note reviewed.  Constitutional:      Appearance: Normal appearance. She is obese.  HENT:     Head: Normocephalic and atraumatic.     Right Ear: Tympanic membrane, ear canal and external ear normal. There is no impacted cerumen.     Left Ear: Tympanic membrane, ear canal and external ear normal. There is no impacted cerumen.     Nose:     Comments: Deferred, masked    Mouth/Throat:     Comments: Deferred, masked Eyes:     Extraocular Movements: Extraocular movements intact.     Pupils: Pupils are equal, round, and reactive to light.  Cardiovascular:     Rate and Rhythm: Normal rate and regular  rhythm.     Heart sounds: Normal heart sounds.  Pulmonary:     Breath sounds: Normal breath sounds.  Musculoskeletal:     Cervical back: Normal range of motion.  Skin:    General: Skin is warm.  Neurological:     General: No focal deficit present.     Mental Status: She is alert and oriented to person, place, and time.         Assessment And Plan:     1. Primary hypothyroidism Comments: I will check thyroid panel and  adjust meds as needed.  - TSH - T4, free  2. Iron deficiency anemia, unspecified iron deficiency anemia type Comments: I will check CBC today. I will add on iron studies as needed.  - Iron and IBC ([id number]) - CBC with Diff - BMP8+EGFR  3. Murmur Comments: I will refer her for 2d echocardiogram.  - ECHOCARDIOGRAM COMPLETE; Future  4. Dizziness Comments: Intermittent, encouraged to stay well hydrated. Orthostatics performed, results pos for orthostatic hypotension. She will let me know if her sx persist. - BMP8+EGFR - EKG 12-Lead  5. Sacroiliitis ([place]) Comments: Not active currently.   6. Moderate episode of recurrent major depressive disorder ([place]) Comments: Chronic, she will continue with current meds.   7. Class 3 severe obesity due to excess calories with serious comorbidity and body mass index (BMI) of 40.0 to 44.9 in adult [facility]) Comments: She is encouraged to strive to lose ten percent of her body weight to decrease cardiac risk. Advised to aim for at least 150 minutes of exercise per week.   8. Need for vaccination Comments: She was given flu vaccine to update her immunization history.  - Flu Vaccine QUAD 6+ mos PF IM (Fluarix Quad PF)     Patient was given opportunity to ask questions. Patient verbalized understanding of the plan and was able to repeat key elements of the plan. All questions were answered to their satisfaction.  [name], MD   I, [name], MD, have reviewed all documentation for this visit. The  documentation on 01/15/20 for the exam, diagnosis, procedures, and orders are all accurate and complete.  THE PATIENT IS ENCOURAGED TO PRACTICE SOCIAL DISTANCING DUE TO THE COVID-19 PANDEMIC.

## 2019-12-28 NOTE — Patient Instructions (Signed)
[name] , Thank you for taking time to come for your Medicare Wellness Visit. I appreciate your ongoing commitment to your health goals. Please review the following plan we discussed and let me know if I can assist you in the future.   Screening recommendations/referrals: Colonoscopy: completed 08/23/2015 Mammogram: patient to schedule Bone Density: n/a Recommended yearly ophthalmology/optometry visit for glaucoma screening and checkup Recommended yearly dental visit for hygiene and checkup  Vaccinations: Influenza vaccine: today Pneumococcal vaccine: n/a Tdap vaccine: completed 12/01/2018 Shingles vaccine: discussed  Covid-19:  05/30/2019, 05/05/2019  Advanced directives: Advance directive discussed with you today. Even though you declined this today please call our office should you change your mind and we can give you the proper paperwork for you to fill out.  Conditions/risks identified: none  Next appointment: 07/04/2020 at 10:45 Follow up in one year for your annual wellness visit.   Preventive Care 40-64 Years, Female Preventive care refers to lifestyle choices and visits with your health care provider that can promote health and wellness. What does preventive care include?  A yearly physical exam. This is also called an annual well check.  Dental exams once or twice a year.  Routine eye exams. Ask your health care provider how often you should have your eyes checked.  Personal lifestyle choices, including:  Daily care of your teeth and gums.  Regular physical activity.  Eating a healthy diet.  Avoiding tobacco and drug use.  Limiting alcohol use.  Practicing safe sex.  Taking low-dose aspirin daily starting at age 72.  Taking vitamin and mineral supplements as recommended by your health care provider. What happens during an annual well check? The services and screenings done by your health care provider during your annual well check will depend on your age,  overall health, lifestyle risk factors, and family history of disease. Counseling  Your health care provider may ask you questions about your:  Alcohol use.  Tobacco use.  Drug use.  Emotional well-being.  Home and relationship well-being.  Sexual activity.  Eating habits.  Work and work Statistician.  Method of birth control.  Menstrual cycle.  Pregnancy history. Screening  You may have the following tests or measurements:  Height, weight, and BMI.  Blood pressure.  Lipid and cholesterol levels. These may be checked every 5 years, or more frequently if you are over 73 years old.  Skin check.  Lung cancer screening. You may have this screening every year starting at age 7 if you have a 30-pack-year history of smoking and currently smoke or have quit within the past 15 years.  Fecal occult blood test (FOBT) of the stool. You may have this test every year starting at age 39.  Flexible sigmoidoscopy or colonoscopy. You may have a sigmoidoscopy every 5 years or a colonoscopy every 10 years starting at age 38.  Hepatitis C blood test.  Hepatitis B blood test.  Sexually transmitted disease (STD) testing.  Diabetes screening. This is done by checking your blood sugar (glucose) after you have not eaten for a while (fasting). You may have this done every 1-3 years.  Mammogram. This may be done every 1-2 years. Talk to your health care provider about when you should start having regular mammograms. This may depend on whether you have a family history of breast cancer.  BRCA-related cancer screening. This may be done if you have a family history of breast, ovarian, tubal, or peritoneal cancers.  Pelvic exam and Pap test. This may be done every  3 years starting at age 62. Starting at age 87, this may be done every 5 years if you have a Pap test in combination with an HPV test.  Bone density scan. This is done to screen for osteoporosis. You may have this scan if you are at  high risk for osteoporosis. Discuss your test results, treatment options, and if necessary, the need for more tests with your health care provider. Vaccines  Your health care provider may recommend certain vaccines, such as:  Influenza vaccine. This is recommended every year.  Tetanus, diphtheria, and acellular pertussis (Tdap, Td) vaccine. You may need a Td booster every 10 years.  Zoster vaccine. You may need this after age 6.  Pneumococcal 13-valent conjugate (PCV13) vaccine. You may need this if you have certain conditions and were not previously vaccinated.  Pneumococcal polysaccharide (PPSV23) vaccine. You may need one or two doses if you smoke cigarettes or if you have certain conditions. Talk to your health care provider about which screenings and vaccines you need and how often you need them. This information is not intended to replace advice given to you by your health care provider. Make sure you discuss any questions you have with your health care provider. Document Released: 02/23/2015 Document Revised: 10/17/2015 Document Reviewed: 11/28/2014 Elsevier Interactive Patient Education  2017 [place] Prevention in the Home Falls can cause injuries. They can happen to people of all ages. There are many things you can do to make your home safe and to help prevent falls. What can I do on the outside of my home?  Regularly fix the edges of walkways and driveways and fix any cracks.  Remove anything that might make you trip as you walk through a door, such as a raised step or threshold.  Trim any bushes or trees on the path to your home.  Use bright outdoor lighting.  Clear any walking paths of anything that might make someone trip, such as rocks or tools.  Regularly check to see if handrails are loose or broken. Make sure that both sides of any steps have handrails.  Any raised decks and porches should have guardrails on the edges.  Have any leaves, snow, or  ice cleared regularly.  Use sand or salt on walking paths during winter.  Clean up any spills in your garage right away. This includes oil or grease spills. What can I do in the bathroom?  Use night lights.  Install grab bars by the toilet and in the tub and shower. Do not use towel bars as grab bars.  Use non-skid mats or decals in the tub or shower.  If you need to sit down in the shower, use a plastic, non-slip stool.  Keep the floor dry. Clean up any water that spills on the floor as soon as it happens.  Remove soap buildup in the tub or shower regularly.  Attach bath mats securely with double-sided non-slip rug tape.  Do not have throw rugs and other things on the floor that can make you trip. What can I do in the bedroom?  Use night lights.  Make sure that you have a light by your bed that is easy to reach.  Do not use any sheets or blankets that are too big for your bed. They should not hang down onto the floor.  Have a firm chair that has side arms. You can use this for support while you get dressed.  Do not have throw  rugs and other things on the floor that can make you trip. What can I do in the kitchen?  Clean up any spills right away.  Avoid walking on wet floors.  Keep items that you use a lot in easy-to-reach places.  If you need to reach something above you, use a strong step stool that has a grab bar.  Keep electrical cords out of the way.  Do not use floor polish or wax that makes floors slippery. If you must use wax, use non-skid floor wax.  Do not have throw rugs and other things on the floor that can make you trip. What can I do with my stairs?  Do not leave any items on the stairs.  Make sure that there are handrails on both sides of the stairs and use them. Fix handrails that are broken or loose. Make sure that handrails are as long as the stairways.  Check any carpeting to make sure that it is firmly attached to the stairs. Fix any carpet  that is loose or worn.  Avoid having throw rugs at the top or bottom of the stairs. If you do have throw rugs, attach them to the floor with carpet tape.  Make sure that you have a light switch at the top of the stairs and the bottom of the stairs. If you do not have them, ask someone to add them for you. What else can I do to help prevent falls?  Wear shoes that:  Do not have high heels.  Have rubber bottoms.  Are comfortable and fit you well.  Are closed at the toe. Do not wear sandals.  If you use a stepladder:  Make sure that it is fully opened. Do not climb a closed stepladder.  Make sure that both sides of the stepladder are locked into place.  Ask someone to hold it for you, if possible.  Clearly mark and make sure that you can see:  Any grab bars or handrails.  First and last steps.  Where the edge of each step is.  Use tools that help you move around (mobility aids) if they are needed. These include:  Canes.  Walkers.  Scooters.  Crutches.  Turn on the lights when you go into a dark area. Replace any light bulbs as soon as they burn out.  Set up your furniture so you have a clear path. Avoid moving your furniture around.  If any of your floors are uneven, fix them.  If there are any pets around you, be aware of where they are.  Review your medicines with your doctor. Some medicines can make you feel dizzy. This can increase your chance of falling. Ask your doctor what other things that you can do to help prevent falls. This information is not intended to replace advice given to you by your health care provider. Make sure you discuss any questions you have with your health care provider. Document Released: 11/23/2008 Document Revised: 07/05/2015 Document Reviewed: 03/03/2014 Elsevier Interactive Patient Education  2017 [employer].

## 2019-12-28 NOTE — Progress Notes (Signed)
This visit occurred during the SARS-CoV-2 public health emergency.  Safety protocols were in place, including screening questions prior to the visit, additional usage of staff PPE, and extensive cleaning of exam room while observing appropriate contact time as indicated for disinfecting solutions.  Subjective:   Natalie Guerrero is a 54 y.o. female who presents for Medicare Annual (Subsequent) preventive examination.  Review of Systems     Cardiac Risk Factors include: obesity (BMI >30kg/m2);sedentary lifestyle     Objective:    Today's Vitals   12/28/19 1218 12/28/19 1231  BP: 134/76   Pulse: 71   Temp: 98 F (36.7 C)   TempSrc: Oral   Weight: 261 lb (118.4 kg)   Height: 5' 4.6" (1.641 m)   PainSc:  4    Body mass index is 43.97 kg/m.  Advanced Directives 12/28/2019 11/02/2018 09/09/2018 07/26/2018 12/22/2017 12/14/2017 01/28/2017  Does Patient Have a Medical Advance Directive? No No No No No No No  Would patient like information on creating a medical advance directive? No - Patient declined No - Patient declined - No - Patient declined No - Patient declined No - Patient declined No - Patient declined    Current Medications (verified) Outpatient Encounter Medications as of 12/28/2019  Medication Sig  . Biotin 10 MG CAPS Take 30 mg by mouth daily.   Marland Kitchen buPROPion (WELLBUTRIN XL) 150 MG 24 hr tablet TAKE 1 TABLET BY MOUTH EVERY DAY  . Carboxymethylcellulose Sodium (REFRESH LIQUIGEL) 1 % GEL Place 1-2 drops into both eyes 4 (four) times daily as needed (for dry eyes).  . Cholecalciferol (VITAMIN D3) 2000 units TABS Take 10,000 Units by mouth daily.  . COLLAGEN PO Take 2 Scoops by mouth daily.  . Cyanocobalamin (VITAMIN B-12) 5000 MCG SUBL Take 10,000 mcg by mouth daily.  Marland Kitchen Dextromethorphan-guaiFENesin (MUCINEX DM MAXIMUM STRENGTH) 60-1200 MG TB12 Take 1 tablet by mouth 2 (two) times daily.  . diphenhydrAMINE (BENADRYL) 25 mg capsule Take 50 mg by mouth 2 (two) times  daily.  Marland Kitchen docusate sodium (COLACE) 100 MG capsule Take 200 mg by mouth 2 (two) times daily.   Marland Kitchen FIBER PO Take 4 capsules by mouth daily. prn  . ibuprofen (ADVIL) 800 MG tablet Take 800 mg by mouth 3 (three) times daily as needed.  Marland Kitchen imipramine (TOFRANIL) 25 MG tablet Take 25 mg by mouth 3 (three) times daily. 3 tabs 2 times per day  . levothyroxine (SYNTHROID) 100 MCG tablet TAKE 1 TABLET (100 MCG TOTAL) BY MOUTH DAILY BEFORE BREAKFAST.  Marland Kitchen Liniments (SALONPAS PAIN RELIEF PATCH EX) Apply 1 patch topically daily.  Marland Kitchen MAGNESIUM PO Take 5 tablets by mouth daily.   Marland Kitchen omeprazole (PRILOSEC) 40 MG capsule Take 1 capsule (40 mg total) by mouth daily.  Marland Kitchen OVER THE COUNTER MEDICATION Apply 1 application topically 2 (two) times daily. [name] and [place] Arthritis Cream  . oxyCODONE (ROXICODONE) 15 MG immediate release tablet every 4 (four) hours as needed. 1  . Polyethyl Glycol-Propyl Glycol (SYSTANE ULTRA) 0.4-0.3 % SOLN Place 2 drops into both eyes 2 (two) times daily as needed (for dry eyes).  Marland Kitchen tiZANidine (ZANAFLEX) 4 MG tablet   . topiramate (TOPAMAX) 100 MG tablet Take 100 mg by mouth 4 (four) times daily.   . Turmeric Curcumin 500 MG CAPS Take 1,000 mg by mouth 2 (two) times daily.  . valACYclovir (VALTREX) 500 MG tablet SMARTSIG:1 Tablet(s) By Mouth Every 12 Hours PRN  . ZINC-VITAMIN C PO Take 6 tablets by mouth daily.  No facility-administered encounter medications on file as of 12/28/2019.    Allergies (verified) Codeine   History: Past Medical History:  Diagnosis Date  . Anxiety   . Arthritis   . DDD (degenerative disc disease)   . Depression   . Hypothyroidism 12/08/2012  . Iron deficiency anemia, unspecified 11/10/2012  . Iron malabsorption 01/20/2017  . Obesity   . Thyroid disease hypothroidism   Past Surgical History:  Procedure Laterality Date  . ABDOMINAL HYSTERECTOMY  1999  . BACK SURGERY    . CESAREAN SECTION  1991  . CHOLECYSTECTOMY N/A 12/22/2017   Procedure:  LAPAROSCOPIC CHOLECYSTECTOMY;  Surgeon: Axel Filler, MD;  Location: [facility] OR;  Service: General;  Laterality: N/A;  . GASTRIC BYPASS  2007  . KNEE SURGERY Left 2000   Family History  Problem Relation Age of Onset  . Prostate cancer Father   . Heart failure Mother   . Colon cancer Neg Hx   . Diabetes Neg Hx   . Kidney disease Neg Hx   . Liver disease Neg Hx    Social History   Socioeconomic History  . Marital status: Married    Spouse name: Not on file  . Number of children: Not on file  . Years of education: Not on file  . Highest education level: Not on file  Occupational History  . Occupation: disability  Tobacco Use  . Smoking status: Never Smoker  . Smokeless tobacco: Never Used  Vaping Use  . Vaping Use: Never used  Substance and Sexual Activity  . Alcohol use: Not Currently  . Drug use: Yes    Types: Oxycodone  . Sexual activity: Yes  Other Topics Concern  . Not on file  Social History Narrative  . Not on file   Social Determinants of Health   Financial Resource Strain: Low Risk   . Difficulty of Paying Living Expenses: Not hard at all  Food Insecurity: No Food Insecurity  . Worried About Programme researcher, broadcasting/film/video in the Last Year: Never true  . Ran Out of Food in the Last Year: Never true  Transportation Needs: No Transportation Needs  . Lack of Transportation (Medical): No  . Lack of Transportation (Non-Medical): No  Physical Activity: Inactive  . Days of Exercise per Week: 0 days  . Minutes of Exercise per Session: 0 min  Stress: No Stress Concern Present  . Feeling of Stress : Not at all  Social Connections:   . Frequency of Communication with Friends and Family: Not on file  . Frequency of Social Gatherings with Friends and Family: Not on file  . Attends Religious Services: Not on file  . Active Member of Clubs or Organizations: Not on file  . Attends Banker Meetings: Not on file  . Marital Status: Not on file    Tobacco  Counseling Counseling given: Not Answered   Clinical Intake:  Pre-visit preparation completed: Yes  Pain : 0-10 Pain Score: 4  Pain Type: Chronic pain Pain Location: Back Pain Orientation: Lower Pain Descriptors / Indicators: Aching, Sharp Pain Onset: More than a month ago Pain Frequency: Constant     Nutritional Status: BMI > 30  Obese Nutritional Risks: None Diabetes: No  How often do you need to have someone help you when you read instructions, pamphlets, or other written materials from your doctor or pharmacy?: 1 - Never What is the last grade level you completed in school?: 73yrs college  Diabetic? no  Interpreter Needed?: No  Information entered  by :: NAllen LPN   Activities of Daily Living In your present state of health, do you have any difficulty performing the following activities: 12/28/2019  Hearing? N  Vision? N  Difficulty concentrating or making decisions? N  Walking or climbing stairs? N  Dressing or bathing? N  Doing errands, shopping? N  Preparing Food and eating ? N  Using the Toilet? N  In the past six months, have you accidently leaked urine? Y  Comment incontinence  Do you have problems with loss of bowel control? N  Managing your Medications? N  Managing your Finances? N  Housekeeping or managing your Housekeeping? N  Some recent data might be hidden    Patient Care Team: Dorothyann PengSanders, Robyn, MD as PCP - General (Internal Medicine) Dalton-Bethea, Ashok CroonShawn M, MD as Consulting Physician (Physical Medicine and Rehabilitation) Ewing Schleinrisp, Gregory, MD as Consulting Physician (Pain Medicine)  Indicate any recent Medical Services you may have received from other than [facility] providers in the past year (date may be approximate).     Assessment:   This is a routine wellness examination for [place].  Hearing/Vision screen  Hearing Screening   125Hz  250Hz  500Hz  1000Hz  2000Hz  3000Hz  4000Hz  6000Hz  8000Hz   Right ear:           Left ear:           Vision  Screening Comments: Regular eye exams, Dr. Harlon FlorWhitaker  Dietary issues and exercise activities discussed: Current Exercise Habits: The patient does not participate in regular exercise at present  Goals    . Exercise 3x per week (30 min per time)    . Patient Stated     12/28/2019, wants to walk 2000 steps daily    . Weight (lb) < 200 lb (90.7 kg)     Our initial goal is to achieve BMI less than 39.     . Weight (lb) < 200 lb (90.7 kg)     09/09/2018, wants to lose 25 pounds      Depression Screen PHQ 2/9 Scores 12/28/2019 07/04/2019 02/28/2019 09/09/2018 12/31/2017  PHQ - 2 Score 1 0 0 0 6  PHQ- 9 Score 7 0 0 0 15  Exception Documentation Other- indicate reason in comment box - - - -  Not completed just completed by CMA - - - -    Fall Risk Fall Risk  12/28/2019 11/08/2018 09/09/2018 12/31/2017  Falls in the past year? 1 1 1  0  Comment lost balance - - -  Number falls in past yr: 0 0 - -  Injury with Fall? 0 0 0 -  Comment - - reaching for the wall -  Risk for fall due to : Impaired balance/gait;History of fall(s);Medication side effect - History of fall(s);Medication side effect -  Follow up - - Falls evaluation completed;Falls prevention discussed -    Any stairs in or around the home? Yes  If so, are there any without handrails? No  Home free of loose throw rugs in walkways, pet beds, electrical cords, etc? Yes  Adequate lighting in your home to reduce risk of falls? Yes   ASSISTIVE DEVICES UTILIZED TO PREVENT FALLS:  Life alert? No  Use of a cane, walker or w/c? Yes  Grab bars in the bathroom? Yes  Shower chair or bench in shower? No  Elevated toilet seat or a handicapped toilet? Yes   TIMED UP AND GO:  Was the test performed? No .  .   Gait slow and steady with assistive device  Cognitive Function:     6CIT Screen 12/28/2019 09/09/2018  What Year? 0 points 0 points  What month? 0 points 0 points  What time? 0 points 0 points  Count back from 20 0 points 0  points  Months in reverse 0 points 0 points  Repeat phrase 0 points 2 points  Total Score 0 2    Immunizations Immunization History  Administered Date(s) Administered  . Influenza,inj,Quad PF,6+ Mos 12/31/2017, 11/08/2018  . PFIZER SARS-COV-2 Vaccination 05/05/2019, 05/30/2019  . Tdap 12/01/2018    TDAP status: Up to date Flu Vaccine status: Completed at today's visit Pneumococcal vaccine status: Up to date Covid-19 vaccine status: Completed vaccines  Qualifies for Shingles Vaccine? Yes   Zostavax completed No   Shingrix Completed?: No.    Education has been provided regarding the importance of this vaccine. Patient has been advised to call insurance company to determine out of pocket expense if they have not yet received this vaccine. Advised may also receive vaccine at local pharmacy or Health Dept. Verbalized acceptance and understanding.  Screening Tests Health Maintenance  Topic Date Due  . Hepatitis C Screening  Never done  . INFLUENZA VACCINE  09/11/2019  . MAMMOGRAM  10/14/2020  . PAP SMEAR-Modifier  06/27/2021  . COLONOSCOPY  08/22/2025  . TETANUS/TDAP  11/30/2028  . COVID-19 Vaccine  Completed  . HIV Screening  Completed    Health Maintenance  Health Maintenance Due  Topic Date Due  . Hepatitis C Screening  Never done  . INFLUENZA VACCINE  09/11/2019    Colorectal cancer screening: Completed 08/23/2015. Repeat every 10 years Mammogram status: patient to call and schedule Bone Density status: n/a  Lung Cancer Screening: (Low Dose CT Chest recommended if Age 60-80 years, 30 pack-year currently smoking OR have quit w/in 15years.) does not qualify.   Lung Cancer Screening Referral: no  Additional Screening:  Hepatitis C Screening: does qualify; decline  Vision Screening: Recommended annual ophthalmology exams for early detection of glaucoma and other disorders of the eye. Is the patient up to date with their annual eye exam?  Yes  Who is the provider or  what is the name of the office in which the patient attends annual eye exams? Dr. Harlon Flor If pt is not established with a provider, would they like to be referred to a provider to establish care? No .   Dental Screening: Recommended annual dental exams for proper oral hygiene  Community Resource Referral / Chronic Care Management: CRR required this visit?  No   CCM required this visit?  No      Plan:     I have personally reviewed and noted the following in the patient's chart:   . Medical and social history . Use of alcohol, tobacco or illicit drugs  . Current medications and supplements . Functional ability and status . Nutritional status . Physical activity . Advanced directives . List of other physicians . Hospitalizations, surgeries, and ER visits in previous 12 months . Vitals . Screenings to include cognitive, depression, and falls . Referrals and appointments  In addition, I have reviewed and discussed with patient certain preventive protocols, quality metrics, and best practice recommendations. A written personalized care plan for preventive services as well as general preventive health recommendations were provided to patient.     Barb Merino, LPN   [id number]   Nurse Notes:

## 2019-12-29 LAB — CBC WITH DIFFERENTIAL/PLATELET
Basophils Absolute: 0 10*3/uL (ref 0.0–0.2)
Basos: 1 %
EOS (ABSOLUTE): 0.1 10*3/uL (ref 0.0–0.4)
Eos: 3 %
Hematocrit: 31.7 % — ABNORMAL LOW (ref 34.0–46.6)
Hemoglobin: 10.3 g/dL — ABNORMAL LOW (ref 11.1–15.9)
Immature Grans (Abs): 0 10*3/uL (ref 0.0–0.1)
Immature Granulocytes: 0 %
Lymphocytes Absolute: 2 10*3/uL (ref 0.7–3.1)
Lymphs: 39 %
MCH: 31.6 pg (ref 26.6–33.0)
MCHC: 32.5 g/dL (ref 31.5–35.7)
MCV: 97 fL (ref 79–97)
Monocytes Absolute: 0.4 10*3/uL (ref 0.1–0.9)
Monocytes: 8 %
Neutrophils Absolute: 2.5 10*3/uL (ref 1.4–7.0)
Neutrophils: 49 %
Platelets: 332 10*3/uL (ref 150–450)
RBC: 3.26 x10E6/uL — ABNORMAL LOW (ref 3.77–5.28)
RDW: 13 % (ref 11.7–15.4)
WBC: 5.1 10*3/uL (ref 3.4–10.8)

## 2019-12-29 LAB — BMP8+EGFR
BUN/Creatinine Ratio: 10 (ref 9–23)
BUN: 10 mg/dL (ref 6–24)
CO2: 21 mmol/L (ref 20–29)
Calcium: 8.4 mg/dL — ABNORMAL LOW (ref 8.7–10.2)
Chloride: 103 mmol/L (ref 96–106)
Creatinine, Ser: 0.96 mg/dL (ref 0.57–1.00)
GFR calc Af Amer: 78 mL/min/{1.73_m2} (ref >59)
GFR calc non Af Amer: 67 mL/min/{1.73_m2} (ref >59)
Glucose: 80 mg/dL (ref 65–99)
Potassium: 4.2 mmol/L (ref 3.5–5.2)
Sodium: 135 mmol/L (ref 134–144)

## 2019-12-29 LAB — IRON AND TIBC
Iron Saturation: 33 % (ref 15–55)
Iron: 96 ug/dL (ref 27–159)
Total Iron Binding Capacity: 295 ug/dL (ref 250–450)
UIBC: 199 ug/dL (ref 131–425)

## 2019-12-29 LAB — TSH: TSH: 0.346 u[IU]/mL — ABNORMAL LOW (ref 0.450–4.500)

## 2019-12-29 LAB — T4, FREE: Free T4: 1.22 ng/dL (ref 0.82–1.77)

## 2019-12-30 ENCOUNTER — Other Ambulatory Visit: Payer: Self-pay

## 2020-01-10 DIAGNOSIS — Z79891 Long term (current) use of opiate analgesic: Secondary | ICD-10-CM | POA: Diagnosis not present

## 2020-01-10 DIAGNOSIS — M5136 Other intervertebral disc degeneration, lumbar region: Secondary | ICD-10-CM | POA: Diagnosis not present

## 2020-01-10 DIAGNOSIS — M179 Osteoarthritis of knee, unspecified: Secondary | ICD-10-CM | POA: Diagnosis not present

## 2020-01-10 DIAGNOSIS — G894 Chronic pain syndrome: Secondary | ICD-10-CM | POA: Diagnosis not present

## 2020-01-12 ENCOUNTER — Inpatient Hospital Stay: Admission: RE | Admit: 2020-01-12 | Payer: Medicare Other | Source: Ambulatory Visit

## 2020-01-18 ENCOUNTER — Encounter: Payer: Self-pay | Admitting: Internal Medicine

## 2020-01-23 DIAGNOSIS — G894 Chronic pain syndrome: Secondary | ICD-10-CM | POA: Diagnosis not present

## 2020-01-24 DIAGNOSIS — M47816 Spondylosis without myelopathy or radiculopathy, lumbar region: Secondary | ICD-10-CM | POA: Diagnosis not present

## 2020-02-07 DIAGNOSIS — Z79891 Long term (current) use of opiate analgesic: Secondary | ICD-10-CM | POA: Diagnosis not present

## 2020-02-07 DIAGNOSIS — M179 Osteoarthritis of knee, unspecified: Secondary | ICD-10-CM | POA: Diagnosis not present

## 2020-02-07 DIAGNOSIS — M5136 Other intervertebral disc degeneration, lumbar region: Secondary | ICD-10-CM | POA: Diagnosis not present

## 2020-02-07 DIAGNOSIS — G894 Chronic pain syndrome: Secondary | ICD-10-CM | POA: Diagnosis not present

## 2020-02-16 DIAGNOSIS — M533 Sacrococcygeal disorders, not elsewhere classified: Secondary | ICD-10-CM | POA: Diagnosis not present

## 2020-02-21 ENCOUNTER — Ambulatory Visit: Payer: Medicare Other

## 2020-02-22 DIAGNOSIS — G894 Chronic pain syndrome: Secondary | ICD-10-CM | POA: Diagnosis not present

## 2020-02-24 ENCOUNTER — Telehealth: Payer: Self-pay

## 2020-02-24 NOTE — Telephone Encounter (Signed)
I  Calling the pt to ask what was the disability forms needing to be completed for.

## 2020-03-06 DIAGNOSIS — Z79891 Long term (current) use of opiate analgesic: Secondary | ICD-10-CM | POA: Diagnosis not present

## 2020-03-06 DIAGNOSIS — M5136 Other intervertebral disc degeneration, lumbar region: Secondary | ICD-10-CM | POA: Diagnosis not present

## 2020-03-06 DIAGNOSIS — M179 Osteoarthritis of knee, unspecified: Secondary | ICD-10-CM | POA: Diagnosis not present

## 2020-03-06 DIAGNOSIS — G894 Chronic pain syndrome: Secondary | ICD-10-CM | POA: Diagnosis not present

## 2020-03-15 DIAGNOSIS — H401111 Primary open-angle glaucoma, right eye, mild stage: Secondary | ICD-10-CM | POA: Diagnosis not present

## 2020-03-23 DIAGNOSIS — Z79891 Long term (current) use of opiate analgesic: Secondary | ICD-10-CM | POA: Diagnosis not present

## 2020-03-23 DIAGNOSIS — G894 Chronic pain syndrome: Secondary | ICD-10-CM | POA: Diagnosis not present

## 2020-03-23 DIAGNOSIS — M179 Osteoarthritis of knee, unspecified: Secondary | ICD-10-CM | POA: Diagnosis not present

## 2020-03-23 DIAGNOSIS — M5136 Other intervertebral disc degeneration, lumbar region: Secondary | ICD-10-CM | POA: Diagnosis not present

## 2020-03-29 ENCOUNTER — Other Ambulatory Visit: Payer: Self-pay

## 2020-03-29 ENCOUNTER — Ambulatory Visit
Admission: RE | Admit: 2020-03-29 | Discharge: 2020-03-29 | Disposition: A | Discharge status: Home / Self Care | Payer: Medicare Other | Source: Ambulatory Visit | Attending: Internal Medicine | Admitting: Internal Medicine

## 2020-03-29 DIAGNOSIS — Z1231 Encounter for screening mammogram for malignant neoplasm of breast: Secondary | ICD-10-CM

## 2020-04-03 DIAGNOSIS — G894 Chronic pain syndrome: Secondary | ICD-10-CM | POA: Diagnosis not present

## 2020-04-03 DIAGNOSIS — Z79891 Long term (current) use of opiate analgesic: Secondary | ICD-10-CM | POA: Diagnosis not present

## 2020-04-03 DIAGNOSIS — M5136 Other intervertebral disc degeneration, lumbar region: Secondary | ICD-10-CM | POA: Diagnosis not present

## 2020-04-12 ENCOUNTER — Ambulatory Visit (INDEPENDENT_AMBULATORY_CARE_PROVIDER_SITE_OTHER): Payer: Medicare Other

## 2020-04-12 VITALS — Ht 65.0 in | Wt 260.0 lb

## 2020-04-12 DIAGNOSIS — Z0000 Encounter for general adult medical examination without abnormal findings: Secondary | ICD-10-CM | POA: Diagnosis not present

## 2020-04-12 NOTE — Progress Notes (Signed)
I connected with Natalie Guerrero today by telephone and verified that I am speaking with the correct person using two identifiers. Location patient: home Location provider: work Persons participating in the virtual visit: Natalie Guerrero, Elisha Ponder LPN.   I discussed the limitations, risks, security and privacy concerns of performing an evaluation and management service by telephone and the availability of in person appointments. I also discussed with the patient that there may be a patient responsible charge related to this service. The patient expressed understanding and verbally consented to this telephonic visit.    Interactive audio and video telecommunications were attempted between this provider and patient, however failed, due to patient having technical difficulties OR patient did not have access to video capability.  We continued and completed visit with audio only.     Vital signs may be patient reported or missing.  Subjective:   Natalie Guerrero is a 55 y.o. female who presents for Medicare Annual (Subsequent) preventive examination.  Review of Systems     Cardiac Risk Factors include: obesity (BMI >30kg/m2)     Objective:    Today's Vitals   04/12/20 1358 04/12/20 1359  Weight: 260 lb (117.9 kg)   Height:  (1.651 m)   PainSc:  8    Body mass index is 43.27 kg/m.  Advanced Directives 04/12/2020 12/28/2019 11/02/2018 09/09/2018 07/26/2018 12/22/2017 12/14/2017  Does Patient Have a Medical Advance Directive? No No No No No No No  Would patient like information on creating a medical advance directive? - No - Patient declined No - Patient declined - No - Patient declined No - Patient declined No - Patient declined    Current Medications (verified) Outpatient Encounter Medications as of 04/12/2020  Medication Sig  . Biotin 10 MG CAPS Take 30 mg by mouth daily.   Marland Kitchen buPROPion (WELLBUTRIN XL) 150 MG 24 hr tablet TAKE 1 TABLET BY MOUTH EVERY DAY  .  CALCIUM+D3 600-800 MG-UNIT TABS Take by mouth. 2 capsules daily  . Carboxymethylcellulose Sodium 1 % GEL Place 1-2 drops into both eyes 4 (four) times daily as needed (for dry eyes).  . cetirizine (ZYRTEC) 10 MG tablet Take 10 mg by mouth daily.  . Cholecalciferol (VITAMIN D3) 2000 units TABS Take 10,000 Units by mouth daily.  . COLLAGEN PO Take 2 Scoops by mouth daily.  . Cyanocobalamin (VITAMIN B-12) 5000 MCG SUBL Take 10,000 mcg by mouth daily.  Marland Kitchen Dextromethorphan-guaiFENesin (MUCINEX DM MAXIMUM STRENGTH) 60-1200 MG TB12 Take 1 tablet by mouth 2 (two) times daily.  Marland Kitchen docusate sodium (COLACE) 100 MG capsule Take 200 mg by mouth 2 (two) times daily.   Marland Kitchen FIBER PO Take 4 capsules by mouth daily. prn  . ibuprofen (ADVIL) 800 MG tablet Take 800 mg by mouth 3 (three) times daily as needed.  Marland Kitchen imipramine (TOFRANIL) 25 MG tablet Take 25 mg by mouth 3 (three) times daily. 3 tabs 2 times per day  . levothyroxine (SYNTHROID) 100 MCG tablet TAKE 1 TABLET (100 MCG TOTAL) BY MOUTH DAILY BEFORE BREAKFAST. (Patient taking differently: Take 100 mcg by mouth daily before breakfast. take one tablet M-F and take 1/2 pill on Saturday/Sunday.  )  . Liniments (SALONPAS PAIN RELIEF PATCH EX) Apply 1 patch topically daily.  Marland Kitchen MAGNESIUM PO Take 5 tablets by mouth daily.   Marland Kitchen omeprazole (PRILOSEC) 40 MG capsule Take 1 capsule (40 mg total) by mouth daily.  Marland Kitchen OVER THE COUNTER MEDICATION Apply 1 application topically 2 (two) times daily. [name] and [place]stone  Arthritis Cream  . oxyCODONE (ROXICODONE) 15 MG immediate release tablet every 4 (four) hours as needed. 1  . Polyethyl Glycol-Propyl Glycol 0.4-0.3 % SOLN Place 2 drops into both eyes 2 (two) times daily as needed (for dry eyes).  Marland Kitchen. tiZANidine (ZANAFLEX) 4 MG tablet   . topiramate (TOPAMAX) 100 MG tablet Take 100 mg by mouth 4 (four) times daily.   . Turmeric Curcumin 500 MG CAPS Take 1,000 mg by mouth 2 (two) times daily.  . valACYclovir (VALTREX) 500 MG tablet  SMARTSIG:1 Tablet(s) By Mouth Every 12 Hours PRN  . ZINC-VITAMIN C PO Take 6 tablets by mouth daily.  . diphenhydrAMINE (BENADRYL) 25 mg capsule Take 50 mg by mouth 2 (two) times daily. (Patient not taking: Reported on 04/12/2020)  . Varenicline Tartrate (TYRVAYA) 0.03 MG/ACT SOLN Place into the nose. 2 times per day each nostril (Patient not taking: Reported on 04/12/2020)   No facility-administered encounter medications on file as of 04/12/2020.    Allergies (verified) Codeine   History: Past Medical History:  Diagnosis Date  . Anxiety   . Arthritis   . DDD (degenerative disc disease)   . Depression   . Hypothyroidism 12/08/2012  . Iron deficiency anemia, unspecified 11/10/2012  . Iron malabsorption 01/20/2017  . Obesity   . Thyroid disease hypothroidism   Past Surgical History:  Procedure Laterality Date  . ABDOMINAL HYSTERECTOMY  1999  . BACK SURGERY    . CESAREAN SECTION  1991  . CHOLECYSTECTOMY N/A 12/22/2017   Procedure: LAPAROSCOPIC CHOLECYSTECTOMY;  Surgeon: Axel Filleramirez, Armando, MD;  Location: [facility] OR;  Service: General;  Laterality: N/A;  . GASTRIC BYPASS  2007  . KNEE SURGERY Left 2000   Family History  Problem Relation Age of Onset  . Prostate cancer Father   . Heart failure Mother   . Colon cancer Neg Hx   . Diabetes Neg Hx   . Kidney disease Neg Hx   . Liver disease Neg Hx    Social History   Socioeconomic History  . Marital status: Married    Spouse name: Not on file  . Number of children: Not on file  . Years of education: Not on file  . Highest education level: Not on file  Occupational History  . Occupation: disability  Tobacco Use  . Smoking status: Never Smoker  . Smokeless tobacco: Never Used  Vaping Use  . Vaping Use: Never used  Substance and Sexual Activity  . Alcohol use: Not Currently  . Drug use: Yes    Types: Oxycodone  . Sexual activity: Yes  Other Topics Concern  . Not on file  Social History Narrative  . Not on file   Social  Determinants of Health   Financial Resource Strain: Low Risk   . Difficulty of Paying Living Expenses: Not hard at all  Food Insecurity: No Food Insecurity  . Worried About Programme researcher, broadcasting/film/videounning Out of Food in the Last Year: Never true  . Ran Out of Food in the Last Year: Never true  Transportation Needs: No Transportation Needs  . Lack of Transportation (Medical): No  . Lack of Transportation (Non-Medical): No  Physical Activity: Insufficiently Active  . Days of Exercise per Week: 2 days  . Minutes of Exercise per Session: 30 min  Stress: No Stress Concern Present  . Feeling of Stress : Not at all  Social Connections: Not on file    Tobacco Counseling Counseling given: Not Answered   Clinical Intake:  Pre-visit preparation completed: Yes  Pain :  0-10 Pain Score: 8  Pain Type: Chronic pain Pain Location: Back Pain Orientation: Lower Pain Descriptors / Indicators: Aching,Throbbing Pain Onset: More than a month ago Pain Frequency: Constant     Nutritional Status: BMI > 30  Obese Nutritional Risks: None Diabetes: No  How often do you need to have someone help you when you read instructions, pamphlets, or other written materials from your doctor or pharmacy?: 1 - Never What is the last grade level you completed in school?: 2 yrs college  Diabetic? no  Interpreter Needed?: No  Information entered by :: NAllen LPN   Activities of Daily Living In your present state of health, do you have any difficulty performing the following activities: 04/12/2020 12/28/2019  Hearing? N N  Vision? N N  Difficulty concentrating or making decisions? N N  Walking or climbing stairs? N N  Dressing or bathing? N N  Doing errands, shopping? N N  Preparing Food and eating ? N N  Using the Toilet? N N  In the past six months, have you accidently leaked urine? Y Y  Comment wears pads incontinence  Do you have problems with loss of bowel control? N N  Managing your Medications? N N  Managing your  Finances? N N  Housekeeping or managing your Housekeeping? N N  Some recent data might be hidden    Patient Care Team: [name], MD as PCP - General (Internal Medicine) Dalton-Bethea, Ashok Croon, MD as Consulting Physician (Physical Medicine and Rehabilitation) Ewing Schlein, MD as Consulting Physician (Pain Medicine)  Indicate any recent Medical Services you may have received from other than [facility] providers in the past year (date may be approximate).     Assessment:   This is a routine wellness examination for [place]  Hearing/Vision screen No exam data present  Dietary issues and exercise activities discussed: Current Exercise Habits: Home exercise routine, Type of exercise: Other - see comments (bike), Time (Minutes): 30, Frequency (Times/Week): 3, Weekly Exercise (Minutes/Week): 90  Goals    . Exercise 3x per week (30 min per time)    . Patient Stated     12/28/2019, wants to walk 2000 steps daily    . Patient Stated     04/12/2020, increase steps by 1000 consistently    . Weight (lb) < 200 lb (90.7 kg)     Our initial goal is to achieve BMI less than 39.     . Weight (lb) < 200 lb (90.7 kg)     09/09/2018, wants to lose 25 pounds      Depression Screen PHQ 2/9 Scores 04/12/2020 12/28/2019 07/04/2019 02/28/2019 09/09/2018 12/31/2017  PHQ - 2 Score 0 1 0 0 0 6  PHQ- 9 Score - 7 0 0 0 15  Exception Documentation - Other- indicate reason in comment box - - - -  Not completed - just completed by CMA - - - -    Fall Risk Fall Risk  04/12/2020 12/28/2019 11/08/2018 09/09/2018 12/31/2017  Falls in the past year? 1 1 1 1  0  Comment tripped, lost balance lost balance - - -  Number falls in past yr: 1 0 0 - -  Injury with Fall? 0 0 0 0 -  Comment - - - reaching for the wall -  Risk for fall due to : Impaired balance/gait;Medication side effect Impaired balance/gait;History of fall(s);Medication side effect - History of fall(s);Medication side effect -  Follow up Falls  evaluation completed;Education provided;Falls prevention discussed - - Falls evaluation  completed;Falls prevention discussed -    FALL RISK PREVENTION PERTAINING TO THE HOME:  Any stairs in or around the home? Yes  If so, are there any without handrails? No  Home free of loose throw rugs in walkways, pet beds, electrical cords, etc? Yes  Adequate lighting in your home to reduce risk of falls? Yes   ASSISTIVE DEVICES UTILIZED TO PREVENT FALLS:  Life alert? No  Use of a cane, walker or w/c? Yes  Grab bars in the bathroom? Yes  Shower chair or bench in shower? No  Elevated toilet seat or a handicapped toilet? Yes   TIMED UP AND GO:  Was the test performed? No .  .   Cognitive Function:     6CIT Screen 04/12/2020 12/28/2019 09/09/2018  What Year? 0 points 0 points 0 points  What month? 0 points 0 points 0 points  What time? 0 points 0 points 0 points  Count back from 20 0 points 0 points 0 points  Months in reverse 0 points 0 points 0 points  Repeat phrase 2 points 0 points 2 points  Total Score 2 0 2    Immunizations Immunization History  Administered Date(s) Administered  . Influenza,inj,Quad PF,6+ Mos 12/31/2017, 11/08/2018, 12/28/2019  . PFIZER(Purple Top)SARS-COV-2 Vaccination 05/05/2019, 05/30/2019  . Tdap 12/01/2018    TDAP status: Up to date  Flu Vaccine status: Up to date  Pneumococcal vaccine status: Up to date  Covid-19 vaccine status: Completed vaccines  Qualifies for Shingles Vaccine? Yes   Zostavax completed No   Shingrix Completed?: No.    Education has been provided regarding the importance of this vaccine. Patient has been advised to call insurance company to determine out of pocket expense if they have not yet received this vaccine. Advised may also receive vaccine at local pharmacy or Health Dept. Verbalized acceptance and understanding.  Screening Tests Health Maintenance  Topic Date Due  . Hepatitis C Screening  Never done  . COVID-19 Vaccine  (3 - Booster for Pfizer series) 11/29/2019  . PAP SMEAR-Modifier  06/27/2021  . MAMMOGRAM  03/29/2022  . COLONOSCOPY (Pts 45-34yrs Insurance coverage will need to be confirmed)  08/22/2025  . TETANUS/TDAP  11/30/2028  . INFLUENZA VACCINE  Completed  . HIV Screening  Completed  . HPV VACCINES  Aged Out    Health Maintenance  Health Maintenance Due  Topic Date Due  . Hepatitis C Screening  Never done  . COVID-19 Vaccine (3 - Booster for Pfizer series) 11/29/2019    Colorectal cancer screening: Type of screening: Colonoscopy. Completed 08/23/2015. Repeat every 10 years  Mammogram status: Completed 03/29/2020. Repeat every year  Bone Density status: n/a  Lung Cancer Screening: (Low Dose CT Chest recommended if Age 79-80 years, 30 pack-year currently smoking OR have quit w/in 15years.) does not qualify.   Lung Cancer Screening Referral: no  Additional Screening:  Hepatitis C Screening: does qualify; due  Vision Screening: Recommended annual ophthalmology exams for early detection of glaucoma and other disorders of the eye. Is the patient up to date with their annual eye exam?  Yes  Who is the provider or what is the name of the office in which the patient attends annual eye exams? Dr. Harlon Flor If pt is not established with a provider, would they like to be referred to a provider to establish care? No .   Dental Screening: Recommended annual dental exams for proper oral hygiene  Community Resource Referral / Chronic Care Management: CRR required this visit?  No   CCM required this visit?  No      Plan:     I have personally reviewed and noted the following in the patient's chart:   . Medical and social history . Use of alcohol, tobacco or illicit drugs  . Current medications and supplements . Functional ability and status . Nutritional status . Physical activity . Advanced directives . List of other physicians . Hospitalizations, surgeries, and ER visits in previous  12 months . Vitals . Screenings to include cognitive, depression, and falls . Referrals and appointments  In addition, I have reviewed and discussed with patient certain preventive protocols, quality metrics, and best practice recommendations. A written personalized care plan for preventive services as well as general preventive health recommendations were provided to patient.     Barb Merino, LPN   09/17/5025   Nurse Notes:

## 2020-04-12 NOTE — Patient Instructions (Signed)
Natalie Guerrero , Thank you for taking time to come for your Medicare Wellness Visit. I appreciate your ongoing commitment to your health goals. Please review the following plan we discussed and let me know if I can assist you in the future.   Screening recommendations/referrals: Colonoscopy: completed 08/23/2015 Mammogram: completed 03/29/2020 Bone Density: n/a Recommended yearly ophthalmology/optometry visit for glaucoma screening and checkup Recommended yearly dental visit for hygiene and checkup  Vaccinations: Influenza vaccine: completed 12/28/2019 Pneumococcal vaccine: n/a Tdap vaccine: completed 12/01/2018, due 11/30/2028 Shingles vaccine: discussed  Covid-19:  02/10/2020, 05/30/2019, 05/05/2019  Advanced directives: Advance directive discussed with you today.   Conditions/risks identified: none  Next appointment: Follow up in one year for your annual wellness visit.   Preventive Care 40-64 Years, Female Preventive care refers to lifestyle choices and visits with your health care provider that can promote health and wellness. What does preventive care include?  A yearly physical exam. This is also called an annual well check.  Dental exams once or twice a year.  Routine eye exams. Ask your health care provider how often you should have your eyes checked.  Personal lifestyle choices, including:  Daily care of your teeth and gums.  Regular physical activity.  Eating a healthy diet.  Avoiding tobacco and drug use.  Limiting alcohol use.  Practicing safe sex.  Taking low-dose aspirin daily starting at age 33.  Taking vitamin and mineral supplements as recommended by your health care provider. What happens during an annual well check? The services and screenings done by your health care provider during your annual well check will depend on your age, overall health, lifestyle risk factors, and family history of disease. Counseling  Your health care provider may ask you  questions about your:  Alcohol use.  Tobacco use.  Drug use.  Emotional well-being.  Home and relationship well-being.  Sexual activity.  Eating habits.  Work and work Statistician.  Method of birth control.  Menstrual cycle.  Pregnancy history. Screening  You may have the following tests or measurements:  Height, weight, and BMI.  Blood pressure.  Lipid and cholesterol levels. These may be checked every 5 years, or more frequently if you are over 26 years old.  Skin check.  Lung cancer screening. You may have this screening every year starting at age 41 if you have a 30-pack-year history of smoking and currently smoke or have quit within the past 15 years.  Fecal occult blood test (FOBT) of the stool. You may have this test every year starting at age 39.  Flexible sigmoidoscopy or colonoscopy. You may have a sigmoidoscopy every 5 years or a colonoscopy every 10 years starting at age 2.  Hepatitis C blood test.  Hepatitis B blood test.  Sexually transmitted disease (STD) testing.  Diabetes screening. This is done by checking your blood sugar (glucose) after you have not eaten for a while (fasting). You may have this done every 1-3 years.  Mammogram. This may be done every 1-2 years. Talk to your health care provider about when you should start having regular mammograms. This may depend on whether you have a family history of breast cancer.  BRCA-related cancer screening. This may be done if you have a family history of breast, ovarian, tubal, or peritoneal cancers.  Pelvic exam and Pap test. This may be done every 3 years starting at age 24. Starting at age 58, this may be done every 5 years if you have a Pap test in combination with an  HPV test.  Bone density scan. This is done to screen for osteoporosis. You may have this scan if you are at high risk for osteoporosis. Discuss your test results, treatment options, and if necessary, the need for more tests with  your health care provider. Vaccines  Your health care provider may recommend certain vaccines, such as:  Influenza vaccine. This is recommended every year.  Tetanus, diphtheria, and acellular pertussis (Tdap, Td) vaccine. You may need a Td booster every 10 years.  Zoster vaccine. You may need this after age 48.  Pneumococcal 13-valent conjugate (PCV13) vaccine. You may need this if you have certain conditions and were not previously vaccinated.  Pneumococcal polysaccharide (PPSV23) vaccine. You may need one or two doses if you smoke cigarettes or if you have certain conditions. Talk to your health care provider about which screenings and vaccines you need and how often you need them. This information is not intended to replace advice given to you by your health care provider. Make sure you discuss any questions you have with your health care provider. Document Released: 02/23/2015 Document Revised: 10/17/2015 Document Reviewed: 11/28/2014 Elsevier Interactive Patient Education  2017 [place] Prevention in the Home Falls can cause injuries. They can happen to people of all ages. There are many things you can do to make your home safe and to help prevent falls. What can I do on the outside of my home?  Regularly fix the edges of walkways and driveways and fix any cracks.  Remove anything that might make you trip as you walk through a door, such as a raised step or threshold.  Trim any bushes or trees on the path to your home.  Use bright outdoor lighting.  Clear any walking paths of anything that might make someone trip, such as rocks or tools.  Regularly check to see if handrails are loose or broken. Make sure that both sides of any steps have handrails.  Any raised decks and porches should have guardrails on the edges.  Have any leaves, snow, or ice cleared regularly.  Use sand or salt on walking paths during winter.  Clean up any spills in your garage right  away. This includes oil or grease spills. What can I do in the bathroom?  Use night lights.  Install grab bars by the toilet and in the tub and shower. Do not use towel bars as grab bars.  Use non-skid mats or decals in the tub or shower.  If you need to sit down in the shower, use a plastic, non-slip stool.  Keep the floor dry. Clean up any water that spills on the floor as soon as it happens.  Remove soap buildup in the tub or shower regularly.  Attach bath mats securely with double-sided non-slip rug tape.  Do not have throw rugs and other things on the floor that can make you trip. What can I do in the bedroom?  Use night lights.  Make sure that you have a light by your bed that is easy to reach.  Do not use any sheets or blankets that are too big for your bed. They should not hang down onto the floor.  Have a firm chair that has side arms. You can use this for support while you get dressed.  Do not have throw rugs and other things on the floor that can make you trip. What can I do in the kitchen?  Clean up any spills right away.  Avoid walking on wet floors.  Keep items that you use a lot in easy-to-reach places.  If you need to reach something above you, use a strong step stool that has a grab bar.  Keep electrical cords out of the way.  Do not use floor polish or wax that makes floors slippery. If you must use wax, use non-skid floor wax.  Do not have throw rugs and other things on the floor that can make you trip. What can I do with my stairs?  Do not leave any items on the stairs.  Make sure that there are handrails on both sides of the stairs and use them. Fix handrails that are broken or loose. Make sure that handrails are as long as the stairways.  Check any carpeting to make sure that it is firmly attached to the stairs. Fix any carpet that is loose or worn.  Avoid having throw rugs at the top or bottom of the stairs. If you do have throw rugs, attach  them to the floor with carpet tape.  Make sure that you have a light switch at the top of the stairs and the bottom of the stairs. If you do not have them, ask someone to add them for you. What else can I do to help prevent falls?  Wear shoes that:  Do not have high heels.  Have rubber bottoms.  Are comfortable and fit you well.  Are closed at the toe. Do not wear sandals.  If you use a stepladder:  Make sure that it is fully opened. Do not climb a closed stepladder.  Make sure that both sides of the stepladder are locked into place.  Ask someone to hold it for you, if possible.  Clearly mark and make sure that you can see:  Any grab bars or handrails.  First and last steps.  Where the edge of each step is.  Use tools that help you move around (mobility aids) if they are needed. These include:  Canes.  Walkers.  Scooters.  Crutches.  Turn on the lights when you go into a dark area. Replace any light bulbs as soon as they burn out.  Set up your furniture so you have a clear path. Avoid moving your furniture around.  If any of your floors are uneven, fix them.  If there are any pets around you, be aware of where they are.  Review your medicines with your doctor. Some medicines can make you feel dizzy. This can increase your chance of falling. Ask your doctor what other things that you can do to help prevent falls. This information is not intended to replace advice given to you by your health care provider. Make sure you discuss any questions you have with your health care provider. Document Released: 11/23/2008 Document Revised: 07/05/2015 Document Reviewed: 03/03/2014 Elsevier Interactive Patient Education  2017 [employer].

## 2020-04-22 DIAGNOSIS — G894 Chronic pain syndrome: Secondary | ICD-10-CM | POA: Diagnosis not present

## 2020-04-24 ENCOUNTER — Telehealth: Payer: Self-pay

## 2020-04-24 NOTE — Telephone Encounter (Signed)
The pt gives an ok for a video visit for evaluation of sinus infection.

## 2020-04-25 ENCOUNTER — Telehealth: Payer: Self-pay | Admitting: Internal Medicine

## 2020-04-29 ENCOUNTER — Other Ambulatory Visit: Payer: Self-pay | Admitting: Internal Medicine

## 2020-05-01 DIAGNOSIS — G894 Chronic pain syndrome: Secondary | ICD-10-CM | POA: Diagnosis not present

## 2020-05-02 ENCOUNTER — Other Ambulatory Visit: Payer: Self-pay | Admitting: Internal Medicine

## 2020-05-22 DIAGNOSIS — G894 Chronic pain syndrome: Secondary | ICD-10-CM | POA: Diagnosis not present

## 2020-05-29 DIAGNOSIS — I119 Hypertensive heart disease without heart failure: Secondary | ICD-10-CM | POA: Diagnosis not present

## 2020-05-29 DIAGNOSIS — M4726 Other spondylosis with radiculopathy, lumbar region: Secondary | ICD-10-CM | POA: Diagnosis not present

## 2020-05-29 DIAGNOSIS — M4807 Spinal stenosis, lumbosacral region: Secondary | ICD-10-CM | POA: Diagnosis not present

## 2020-05-29 DIAGNOSIS — R609 Edema, unspecified: Secondary | ICD-10-CM | POA: Diagnosis not present

## 2020-05-29 DIAGNOSIS — G8929 Other chronic pain: Secondary | ICD-10-CM | POA: Diagnosis not present

## 2020-05-29 DIAGNOSIS — M48062 Spinal stenosis, lumbar region with neurogenic claudication: Secondary | ICD-10-CM | POA: Diagnosis not present

## 2020-05-29 DIAGNOSIS — Z8249 Family history of ischemic heart disease and other diseases of the circulatory system: Secondary | ICD-10-CM | POA: Diagnosis not present

## 2020-05-29 DIAGNOSIS — M792 Neuralgia and neuritis, unspecified: Secondary | ICD-10-CM | POA: Diagnosis not present

## 2020-05-29 DIAGNOSIS — G894 Chronic pain syndrome: Secondary | ICD-10-CM | POA: Diagnosis not present

## 2020-05-29 DIAGNOSIS — M25519 Pain in unspecified shoulder: Secondary | ICD-10-CM | POA: Diagnosis not present

## 2020-05-29 DIAGNOSIS — M25559 Pain in unspecified hip: Secondary | ICD-10-CM | POA: Diagnosis not present

## 2020-05-29 DIAGNOSIS — M47897 Other spondylosis, lumbosacral region: Secondary | ICD-10-CM | POA: Diagnosis not present

## 2020-06-11 DIAGNOSIS — M5136 Other intervertebral disc degeneration, lumbar region: Secondary | ICD-10-CM | POA: Diagnosis not present

## 2020-06-11 DIAGNOSIS — M5415 Radiculopathy, thoracolumbar region: Secondary | ICD-10-CM | POA: Diagnosis not present

## 2020-06-11 DIAGNOSIS — M9905 Segmental and somatic dysfunction of pelvic region: Secondary | ICD-10-CM | POA: Diagnosis not present

## 2020-06-11 DIAGNOSIS — M9904 Segmental and somatic dysfunction of sacral region: Secondary | ICD-10-CM | POA: Diagnosis not present

## 2020-06-11 DIAGNOSIS — M5432 Sciatica, left side: Secondary | ICD-10-CM | POA: Diagnosis not present

## 2020-06-11 DIAGNOSIS — M5417 Radiculopathy, lumbosacral region: Secondary | ICD-10-CM | POA: Diagnosis not present

## 2020-06-11 DIAGNOSIS — M9903 Segmental and somatic dysfunction of lumbar region: Secondary | ICD-10-CM | POA: Diagnosis not present

## 2020-06-11 DIAGNOSIS — M9902 Segmental and somatic dysfunction of thoracic region: Secondary | ICD-10-CM | POA: Diagnosis not present

## 2020-06-14 DIAGNOSIS — M9902 Segmental and somatic dysfunction of thoracic region: Secondary | ICD-10-CM | POA: Diagnosis not present

## 2020-06-14 DIAGNOSIS — M9903 Segmental and somatic dysfunction of lumbar region: Secondary | ICD-10-CM | POA: Diagnosis not present

## 2020-06-14 DIAGNOSIS — M5432 Sciatica, left side: Secondary | ICD-10-CM | POA: Diagnosis not present

## 2020-06-14 DIAGNOSIS — M9904 Segmental and somatic dysfunction of sacral region: Secondary | ICD-10-CM | POA: Diagnosis not present

## 2020-06-14 DIAGNOSIS — M5136 Other intervertebral disc degeneration, lumbar region: Secondary | ICD-10-CM | POA: Diagnosis not present

## 2020-06-14 DIAGNOSIS — M5415 Radiculopathy, thoracolumbar region: Secondary | ICD-10-CM | POA: Diagnosis not present

## 2020-06-14 DIAGNOSIS — M9905 Segmental and somatic dysfunction of pelvic region: Secondary | ICD-10-CM | POA: Diagnosis not present

## 2020-06-14 DIAGNOSIS — M5417 Radiculopathy, lumbosacral region: Secondary | ICD-10-CM | POA: Diagnosis not present

## 2020-06-18 DIAGNOSIS — M9904 Segmental and somatic dysfunction of sacral region: Secondary | ICD-10-CM | POA: Diagnosis not present

## 2020-06-18 DIAGNOSIS — M9902 Segmental and somatic dysfunction of thoracic region: Secondary | ICD-10-CM | POA: Diagnosis not present

## 2020-06-18 DIAGNOSIS — M5417 Radiculopathy, lumbosacral region: Secondary | ICD-10-CM | POA: Diagnosis not present

## 2020-06-18 DIAGNOSIS — M5432 Sciatica, left side: Secondary | ICD-10-CM | POA: Diagnosis not present

## 2020-06-18 DIAGNOSIS — M5136 Other intervertebral disc degeneration, lumbar region: Secondary | ICD-10-CM | POA: Diagnosis not present

## 2020-06-18 DIAGNOSIS — M9905 Segmental and somatic dysfunction of pelvic region: Secondary | ICD-10-CM | POA: Diagnosis not present

## 2020-06-18 DIAGNOSIS — M9903 Segmental and somatic dysfunction of lumbar region: Secondary | ICD-10-CM | POA: Diagnosis not present

## 2020-06-18 DIAGNOSIS — M5415 Radiculopathy, thoracolumbar region: Secondary | ICD-10-CM | POA: Diagnosis not present

## 2020-06-20 DIAGNOSIS — M9903 Segmental and somatic dysfunction of lumbar region: Secondary | ICD-10-CM | POA: Diagnosis not present

## 2020-06-20 DIAGNOSIS — M5417 Radiculopathy, lumbosacral region: Secondary | ICD-10-CM | POA: Diagnosis not present

## 2020-06-20 DIAGNOSIS — M9904 Segmental and somatic dysfunction of sacral region: Secondary | ICD-10-CM | POA: Diagnosis not present

## 2020-06-20 DIAGNOSIS — M5136 Other intervertebral disc degeneration, lumbar region: Secondary | ICD-10-CM | POA: Diagnosis not present

## 2020-06-20 DIAGNOSIS — M9902 Segmental and somatic dysfunction of thoracic region: Secondary | ICD-10-CM | POA: Diagnosis not present

## 2020-06-20 DIAGNOSIS — M5432 Sciatica, left side: Secondary | ICD-10-CM | POA: Diagnosis not present

## 2020-06-20 DIAGNOSIS — M5415 Radiculopathy, thoracolumbar region: Secondary | ICD-10-CM | POA: Diagnosis not present

## 2020-06-20 DIAGNOSIS — M9905 Segmental and somatic dysfunction of pelvic region: Secondary | ICD-10-CM | POA: Diagnosis not present

## 2020-06-21 ENCOUNTER — Other Ambulatory Visit: Payer: Self-pay | Admitting: Internal Medicine

## 2020-06-22 ENCOUNTER — Other Ambulatory Visit: Payer: Self-pay | Admitting: Internal Medicine

## 2020-06-27 DIAGNOSIS — M5432 Sciatica, left side: Secondary | ICD-10-CM | POA: Diagnosis not present

## 2020-06-27 DIAGNOSIS — M9902 Segmental and somatic dysfunction of thoracic region: Secondary | ICD-10-CM | POA: Diagnosis not present

## 2020-06-27 DIAGNOSIS — M5136 Other intervertebral disc degeneration, lumbar region: Secondary | ICD-10-CM | POA: Diagnosis not present

## 2020-06-27 DIAGNOSIS — M5415 Radiculopathy, thoracolumbar region: Secondary | ICD-10-CM | POA: Diagnosis not present

## 2020-06-27 DIAGNOSIS — M9905 Segmental and somatic dysfunction of pelvic region: Secondary | ICD-10-CM | POA: Diagnosis not present

## 2020-06-27 DIAGNOSIS — M9903 Segmental and somatic dysfunction of lumbar region: Secondary | ICD-10-CM | POA: Diagnosis not present

## 2020-06-27 DIAGNOSIS — M5417 Radiculopathy, lumbosacral region: Secondary | ICD-10-CM | POA: Diagnosis not present

## 2020-06-27 DIAGNOSIS — M9904 Segmental and somatic dysfunction of sacral region: Secondary | ICD-10-CM | POA: Diagnosis not present

## 2020-07-04 ENCOUNTER — Other Ambulatory Visit: Payer: Self-pay

## 2020-07-04 ENCOUNTER — Encounter: Payer: Self-pay | Admitting: Internal Medicine

## 2020-07-04 ENCOUNTER — Ambulatory Visit (INDEPENDENT_AMBULATORY_CARE_PROVIDER_SITE_OTHER): Payer: Medicare Other | Admitting: Internal Medicine

## 2020-07-04 VITALS — BP 116/70 | HR 91 | Temp 98.0°F | Ht 66.6 in | Wt 266.0 lb

## 2020-07-04 DIAGNOSIS — E039 Hypothyroidism, unspecified: Secondary | ICD-10-CM

## 2020-07-04 DIAGNOSIS — M961 Postlaminectomy syndrome, not elsewhere classified: Secondary | ICD-10-CM | POA: Diagnosis not present

## 2020-07-04 DIAGNOSIS — F4321 Adjustment disorder with depressed mood: Secondary | ICD-10-CM | POA: Diagnosis not present

## 2020-07-04 DIAGNOSIS — Z0000 Encounter for general adult medical examination without abnormal findings: Secondary | ICD-10-CM

## 2020-07-04 DIAGNOSIS — M7661 Achilles tendinitis, right leg: Secondary | ICD-10-CM | POA: Diagnosis not present

## 2020-07-04 DIAGNOSIS — Z6841 Body Mass Index (BMI) 40.0 and over, adult: Secondary | ICD-10-CM | POA: Insufficient documentation

## 2020-07-04 DIAGNOSIS — F331 Major depressive disorder, recurrent, moderate: Secondary | ICD-10-CM

## 2020-07-04 DIAGNOSIS — Z1159 Encounter for screening for other viral diseases: Secondary | ICD-10-CM | POA: Diagnosis not present

## 2020-07-04 DIAGNOSIS — M461 Sacroiliitis, not elsewhere classified: Secondary | ICD-10-CM | POA: Diagnosis not present

## 2020-07-04 DIAGNOSIS — E66813 Obesity, class 3: Secondary | ICD-10-CM | POA: Insufficient documentation

## 2020-07-04 DIAGNOSIS — M7662 Achilles tendinitis, left leg: Secondary | ICD-10-CM | POA: Diagnosis not present

## 2020-07-04 NOTE — Patient Instructions (Signed)
Health Maintenance, Female Adopting a healthy lifestyle and getting preventive care are important in promoting health and wellness. Ask your health care provider about:  The right schedule for you to have regular tests and exams.  Things you can do on your own to prevent diseases and keep yourself healthy. What should I know about diet, weight, and exercise? Eat a healthy diet  Eat a diet that includes plenty of vegetables, fruits, low-fat dairy products, and lean protein.  Do not eat a lot of foods that are high in solid fats, added sugars, or sodium.   Maintain a healthy weight Body mass index (BMI) is used to identify weight problems. It estimates body fat based on height and weight. Your health care provider can help determine your BMI and help you achieve or maintain a healthy weight. Get regular exercise Get regular exercise. This is one of the most important things you can do for your health. Most adults should:  Exercise for at least 150 minutes each week. The exercise should increase your heart rate and make you sweat (moderate-intensity exercise).  Do strengthening exercises at least twice a week. This is in addition to the moderate-intensity exercise.  Spend less time sitting. Even light physical activity can be beneficial. Watch cholesterol and blood lipids Have your blood tested for lipids and cholesterol at 56 years of age, then have this test every 5 years. Have your cholesterol levels checked more often if:  Your lipid or cholesterol levels are high.  You are older than 56 years of age.  You are at high risk for heart disease. What should I know about cancer screening? Depending on your health history and family history, you may need to have cancer screening at various ages. This may include screening for:  Breast cancer.  Cervical cancer.  Colorectal cancer.  Skin cancer.  Lung cancer. What should I know about heart disease, diabetes, and high blood  pressure? Blood pressure and heart disease  High blood pressure causes heart disease and increases the risk of stroke. This is more likely to develop in people who have high blood pressure readings, are of African descent, or are overweight.  Have your blood pressure checked: ? Every 3-5 years if you are 18-39 years of age. ? Every year if you are 40 years old or older. Diabetes Have regular diabetes screenings. This checks your fasting blood sugar level. Have the screening done:  Once every three years after age 40 if you are at a normal weight and have a low risk for diabetes.  More often and at a younger age if you are overweight or have a high risk for diabetes. What should I know about preventing infection? Hepatitis B If you have a higher risk for hepatitis B, you should be screened for this virus. Talk with your health care provider to find out if you are at risk for hepatitis B infection. Hepatitis C Testing is recommended for:  Everyone born from 1945 through 1965.  Anyone with known risk factors for hepatitis C. Sexually transmitted infections (STIs)  Get screened for STIs, including gonorrhea and chlamydia, if: ? You are sexually active and are younger than 56 years of age. ? You are older than 56 years of age and your health care provider tells you that you are at risk for this type of infection. ? Your sexual activity has changed since you were last screened, and you are at increased risk for chlamydia or gonorrhea. Ask your health care provider   if you are at risk.  Ask your health care provider about whether you are at high risk for HIV. Your health care provider may recommend a prescription medicine to help prevent HIV infection. If you choose to take medicine to prevent HIV, you should first get tested for HIV. You should then be tested every 3 months for as long as you are taking the medicine. Pregnancy  If you are about to stop having your period (premenopausal) and  you may become pregnant, seek counseling before you get pregnant.  Take 400 to 800 micrograms (mcg) of folic acid every day if you become pregnant.  Ask for birth control (contraception) if you want to prevent pregnancy. Osteoporosis and menopause Osteoporosis is a disease in which the bones lose minerals and strength with aging. This can result in bone fractures. If you are 65 years old or older, or if you are at risk for osteoporosis and fractures, ask your health care provider if you should:  Be screened for bone loss.  Take a calcium or vitamin D supplement to lower your risk of fractures.  Be given hormone replacement therapy (HRT) to treat symptoms of menopause. Follow these instructions at home: Lifestyle  Do not use any products that contain nicotine or tobacco, such as cigarettes, e-cigarettes, and chewing tobacco. If you need help quitting, ask your health care provider.  Do not use street drugs.  Do not share needles.  Ask your health care provider for help if you need support or information about quitting drugs. Alcohol use  Do not drink alcohol if: ? Your health care provider tells you not to drink. ? You are pregnant, may be pregnant, or are planning to become pregnant.  If you drink alcohol: ? Limit how much you use to 0-1 drink a day. ? Limit intake if you are breastfeeding.  Be aware of how much alcohol is in your drink. In the [place] one drink equals one 12 oz bottle of beer (355 mL), one 5 oz glass of wine (148 mL), or one 1 oz glass of hard liquor (44 mL). General instructions  Schedule regular health, dental, and eye exams.  Stay current with your vaccines.  Tell your health care provider if: ? You often feel depressed. ? You have ever been abused or do not feel safe at home. Summary  Adopting a healthy lifestyle and getting preventive care are important in promoting health and wellness.  Follow your health care provider's instructions about healthy  diet, exercising, and getting tested or screened for diseases.  Follow your health care provider's instructions on monitoring your cholesterol and blood pressure. This information is not intended to replace advice given to you by your health care provider. Make sure you discuss any questions you have with your health care provider. Document Revised: 01/20/2018 Document Reviewed: 01/20/2018 Elsevier Patient Education  2021 [employer] Inc.  

## 2020-07-04 NOTE — Progress Notes (Signed)
I,Tianna Badgett,acting as a Education administrator for [name], MD.,have documented all relevant documentation on the behalf of [name], MD,as directed by  [name], MD while in the presence of [name], MD.  This visit occurred during the SARS-CoV-2 public health emergency.  Safety protocols were in place, including screening questions prior to the visit, additional usage of staff PPE, and extensive cleaning of exam room while observing appropriate contact time as indicated for disinfecting solutions.  Subjective:     Patient ID: Natalie Guerrero , female    DOB: 10-Aug-1965 , 55 y.o.   MRN: [id number]   Chief Complaint  Patient presents with  . Annual Exam    HPI  She is here today for a full physical exam. She is followed by GYN, Dr. Elinor Parkinson for her pelvic exams. Unfortunately, she is still having a great deal of pain from her previous work injury. This has resulted in chronic back pain.   Thyroid Problem Presents for follow-up visit. Patient reports no cold intolerance, constipation, menstrual problem, nail problem, palpitations or tremors.     Past Medical History:  Diagnosis Date  . Anxiety   . Arthritis   . DDD (degenerative disc disease)   . Depression   . Hypothyroidism 12/08/2012  . Iron deficiency anemia, unspecified 11/10/2012  . Iron malabsorption 01/20/2017  . Obesity   . Thyroid disease hypothroidism     Family History  Problem Relation Age of Onset  . Prostate cancer Father   . Heart failure Mother   . Colon cancer Neg Hx   . Diabetes Neg Hx   . Kidney disease Neg Hx   . Liver disease Neg Hx      Current Outpatient Medications:  .  Biotin 10 MG CAPS, Take 30 mg by mouth daily. , Disp: , Rfl:  .  brimonidine (ALPHAGAN) 0.2 % ophthalmic solution, Place 1 drop into the right eye 2 (two) times daily., Disp: , Rfl:  .  buPROPion (WELLBUTRIN XL) 150 MG 24 hr tablet, TAKE 1 TABLET BY MOUTH EVERY DAY, Disp: 90 tablet, Rfl: 1 .   CALCIUM+D3 600-800 MG-UNIT TABS, Take by mouth. 2 capsules daily, Disp: , Rfl:  .  Carboxymethylcellulose Sodium 1 % GEL, Place 1-2 drops into both eyes 4 (four) times daily as needed (for dry eyes)., Disp: , Rfl:  .  cetirizine (ZYRTEC) 10 MG tablet, Take 10 mg by mouth daily., Disp: , Rfl:  .  Cholecalciferol (VITAMIN D3) 2000 units TABS, Take 10,000 Units by mouth daily., Disp: , Rfl:  .  COLLAGEN PO, Take 2 Scoops by mouth daily., Disp: , Rfl:  .  Cyanocobalamin (VITAMIN B-12) 5000 MCG SUBL, Take 10,000 mcg by mouth daily., Disp: , Rfl:  .  cycloSPORINE (RESTASIS) 0.05 % ophthalmic emulsion, Place 1 drop into both eyes 2 (two) times daily., Disp: , Rfl:  .  Dextromethorphan-guaiFENesin (MUCINEX DM MAXIMUM STRENGTH) 60-1200 MG TB12, Take 1 tablet by mouth 2 (two) times daily., Disp: , Rfl:  .  diphenhydrAMINE (BENADRYL) 25 mg capsule, Take 50 mg by mouth 2 (two) times daily., Disp: , Rfl:  .  docusate sodium (COLACE) 100 MG capsule, Take 200 mg by mouth 2 (two) times daily. , Disp: , Rfl:  .  FIBER PO, Take 4 capsules by mouth daily. prn, Disp: , Rfl:  .  ibuprofen (ADVIL) 800 MG tablet, Take 800 mg by mouth 3 (three) times daily as needed., Disp: , Rfl:  .  imipramine (TOFRANIL)  25 MG tablet, Take 25 mg by mouth 3 (three) times daily. 3 tabs 2 times per day, Disp: , Rfl: 4 .  levothyroxine (SYNTHROID) 100 MCG tablet, take one tablet M-F and take 1/2 pill on Saturday/Sunday., Disp: 90 tablet, Rfl: 0 .  Liniments (SALONPAS PAIN RELIEF PATCH EX), Apply 1 patch topically daily., Disp: , Rfl:  .  MAGNESIUM PO, Take 5 tablets by mouth daily. , Disp: , Rfl:  .  omeprazole (PRILOSEC) 40 MG capsule, TAKE 1 CAPSULE BY MOUTH EVERY DAY, Disp: 90 capsule, Rfl: 1 .  OVER THE COUNTER MEDICATION, Apply 1 application topically 2 (two) times daily. Beck and [place] Arthritis Cream, Disp: , Rfl:  .  oxyCODONE (ROXICODONE) 15 MG immediate release tablet, every 4 (four) hours as needed. 1, Disp: , Rfl:  .   tiZANidine (ZANAFLEX) 4 MG tablet, , Disp: , Rfl:  .  topiramate (TOPAMAX) 100 MG tablet, Take 100 mg by mouth 4 (four) times daily. , Disp: , Rfl: 5 .  Turmeric Curcumin 500 MG CAPS, Take 1,000 mg by mouth 2 (two) times daily., Disp: , Rfl:  .  valACYclovir (VALTREX) 500 MG tablet, SMARTSIG:1 Tablet(s) By Mouth Every 12 Hours PRN, Disp: , Rfl:  .  ZINC-VITAMIN C PO, Take 6 tablets by mouth daily., Disp: , Rfl:  .  XTAMPZA ER 13.5 MG C12A, SMARTSIG:1 Capsule(s) By Mouth Every 8-12 Hours, Disp: , Rfl:    Allergies  Allergen Reactions  . Codeine Itching      The patient states she uses none for birth control. Last LMP was No LMP recorded. Patient has had a hysterectomy.. Negative for Dysmenorrhea. Negative for: breast discharge, breast lump(s), breast pain and breast self exam. Associated symptoms include abnormal vaginal bleeding. Pertinent negatives include abnormal bleeding (hematology), anxiety, decreased libido, depression, difficulty falling sleep, dyspareunia, history of infertility, nocturia, sexual dysfunction, sleep disturbances, urinary incontinence, urinary urgency, vaginal discharge and vaginal itching. Diet regular.The patient states her exercise level is  intermittent.  . The patient's tobacco use is:  Social History   Tobacco Use  Smoking Status Never Smoker  Smokeless Tobacco Never Used  . She has been exposed to passive smoke. The patient's alcohol use is:  Social History   Substance and Sexual Activity  Alcohol Use Not Currently   Review of Systems  Constitutional: Negative.   HENT: Negative.   Eyes: Negative.   Respiratory: Negative.   Cardiovascular: Negative.  Negative for palpitations.  Gastrointestinal: Negative.  Negative for constipation.  Endocrine: Negative.  Negative for cold intolerance.  Genitourinary: Negative.  Negative for menstrual problem.  Musculoskeletal: Negative.        She has chronic back pain. Followed at pain clinic.   Also c/o pain  behind her ankles. There is pain w/ ambulation. Occurs when first getting up out of bed, or after being seated for long periods of time  Skin: Negative.   Allergic/Immunologic: Negative.   Neurological: Negative.  Negative for tremors.  Hematological: Negative.   Psychiatric/Behavioral: Negative.      Today's Vitals   07/04/20 1130  BP: 116/70  Pulse: 91  Temp: 98 F (36.7 C)  TempSrc: Oral  Weight: 266 lb (120.7 kg)  Height: 5' 6.6" (1.692 m)   Body mass index is 42.16 kg/m.   Objective:  Physical Exam Vitals and nursing note reviewed.  Constitutional:      Appearance: Normal appearance. She is obese.  HENT:     Head: Normocephalic and atraumatic.  Right Ear: Tympanic membrane, ear canal and external ear normal.     Left Ear: Tympanic membrane, ear canal and external ear normal.     Nose:     Comments: Masked     Mouth/Throat:     Comments: Masked  Eyes:     Extraocular Movements: Extraocular movements intact.     Conjunctiva/sclera: Conjunctivae normal.     Pupils: Pupils are equal, round, and reactive to light.  Cardiovascular:     Rate and Rhythm: Normal rate and regular rhythm.     Pulses: Normal pulses.     Heart sounds: Normal heart sounds.  Pulmonary:     Effort: Pulmonary effort is normal.     Breath sounds: Normal breath sounds.  Abdominal:     General: Bowel sounds are normal.     Palpations: Abdomen is soft.     Comments: Obese, soft. Difficulty to assess organomegaly.   Genitourinary:    Comments: deferred Musculoskeletal:        General: Normal range of motion.     Cervical back: Normal range of motion and neck supple.  Skin:    General: Skin is warm and dry.  Neurological:     General: No focal deficit present.     Mental Status: She is alert and oriented to person, place, and time.  Psychiatric:        Mood and Affect: Mood normal.        Behavior: Behavior normal.         Assessment And Plan:     1. Routine general medical  examination at health care facility Comments: A full exam was performed. Importance of monthly self breast exams was discussed with the patient. PATIENT IS ADVISED TO GET 30-45 MINUTES REGULAR EXERCISE NO LESS THAN FOUR TO FIVE DAYS PER WEEK - BOTH WEIGHTBEARING EXERCISES AND AEROBIC ARE RECOMMENDED.  PATIENT IS ADVISED TO FOLLOW A HEALTHY DIET WITH AT LEAST SIX FRUITS/VEGGIES PER DAY, DECREASE INTAKE OF RED MEAT, AND TO INCREASE FISH INTAKE TO TWO DAYS PER WEEK.  MEATS/FISH SHOULD NOT BE FRIED, BAKED OR BROILED IS PREFERABLE.  IT IS ALSO IMPORTANT TO CUT BACK ON YOUR SUGAR INTAKE. PLEASE AVOID ANYTHING WITH ADDED SUGAR, CORN SYRUP OR OTHER SWEETENERS. IF YOU MUST USE A SWEETENER, YOU CAN TRY STEVIA. IT IS ALSO IMPORTANT TO AVOID ARTIFICIALLY SWEETENERS AND DIET BEVERAGES. LASTLY, I SUGGEST WEARING SPF 50 SUNSCREEN ON EXPOSED PARTS AND ESPECIALLY WHEN IN THE DIRECT SUNLIGHT FOR AN EXTENDED PERIOD OF TIME.  PLEASE AVOID FAST FOOD RESTAURANTS AND INCREASE YOUR WATER INTAKE.   2. Primary hypothyroidism Comments: I will check thyroid panel and adjust meds as needed.  - CBC - CMP14+EGFR - Lipid panel - TSH - T4, free  3. Sacroiliitis ([place]) Comments: Chronic, I will refer her for PT. I think she may benefit from aquatic therapy and/or dry needling.  - Ambulatory referral to Physical Therapy  4. Post laminectomy syndrome Comments: Please see above.   5. Grief Comments: She is still grieving loss of her Mom who passed in 2021.  - Ambulatory referral to Psychology  6. Moderate episode of recurrent major depressive disorder (HCC) Comments: Chronic, she will continue with Wellbutrin XL. She agrees to referral for therapy. May consider Genesight testing in the future. - Ambulatory referral to Psychology  7. Achilles tendinitis of both lower extremities Comments: She was given some stretching exercises to perform and to apply Voltaren gel to affected area bid prn. She prefers to schedule her  own  Podiatry appt. May benefit from dry needling.  - Ambulatory referral to Physical Therapy  8. Class 3 severe obesity due to excess calories with serious comorbidity and body mass index (BMI) of 40.0 to 44.9 in adult [facility]) Comments: BMI 42. She is s/p bypass surgery many years ago. Encouraged to increase exercise as tolerated, aiming for at least 150 minutes exercise/week.   9. Encounter for HCV screening test for low risk patient Comments: I will check HCV antibody today.  - Hepatitis C antibody  Patient was given opportunity to ask questions. Patient verbalized understanding of the plan and was able to repeat key elements of the plan. All questions were answered to their satisfaction.   I, [name], MD, have reviewed all documentation for this visit. The documentation on 07/09/20 for the exam, diagnosis, procedures, and orders are all accurate and complete.  THE PATIENT IS ENCOURAGED TO PRACTICE SOCIAL DISTANCING DUE TO THE COVID-19 PANDEMIC.

## 2020-07-05 DIAGNOSIS — G894 Chronic pain syndrome: Secondary | ICD-10-CM | POA: Diagnosis not present

## 2020-07-05 LAB — CMP14+EGFR
ALT: 15 IU/L (ref 0–32)
AST: 16 IU/L (ref 0–40)
Albumin/Globulin Ratio: 1.5 (ref 1.2–2.2)
Albumin: 4 g/dL (ref 3.8–4.9)
Alkaline Phosphatase: 110 IU/L (ref 44–121)
BUN/Creatinine Ratio: 11 (ref 9–23)
BUN: 10 mg/dL (ref 6–24)
Bilirubin Total: 0.3 mg/dL (ref 0.0–1.2)
CO2: 19 mmol/L — ABNORMAL LOW (ref 20–29)
Calcium: 8.9 mg/dL (ref 8.7–10.2)
Chloride: 102 mmol/L (ref 96–106)
Creatinine, Ser: 0.9 mg/dL (ref 0.57–1.00)
Globulin, Total: 2.6 g/dL (ref 1.5–4.5)
Glucose: 93 mg/dL (ref 65–99)
Potassium: 4.5 mmol/L (ref 3.5–5.2)
Sodium: 136 mmol/L (ref 134–144)
Total Protein: 6.6 g/dL (ref 6.0–8.5)
eGFR: 75 mL/min/{1.73_m2} (ref >59)

## 2020-07-05 LAB — LIPID PANEL
Chol/HDL Ratio: 2.3 ratio (ref 0.0–4.4)
Cholesterol, Total: 189 mg/dL (ref 100–199)
HDL: 83 mg/dL (ref >39)
LDL Chol Calc (NIH): 92 mg/dL (ref 0–99)
Triglycerides: 77 mg/dL (ref 0–149)
VLDL Cholesterol Cal: 14 mg/dL (ref 5–40)

## 2020-07-05 LAB — TSH: TSH: 0.409 u[IU]/mL — ABNORMAL LOW (ref 0.450–4.500)

## 2020-07-05 LAB — HEPATITIS C ANTIBODY: Hep C Virus Ab: <0.1 s/co ratio (ref 0.0–0.9)

## 2020-07-05 LAB — CBC
Hematocrit: 33.7 % — ABNORMAL LOW (ref 34.0–46.6)
Hemoglobin: 10.7 g/dL — ABNORMAL LOW (ref 11.1–15.9)
MCH: 30.2 pg (ref 26.6–33.0)
MCHC: 31.8 g/dL (ref 31.5–35.7)
MCV: 95 fL (ref 79–97)
Platelets: 378 10*3/uL (ref 150–450)
RBC: 3.54 x10E6/uL — ABNORMAL LOW (ref 3.77–5.28)
RDW: 13.3 % (ref 11.7–15.4)
WBC: 5.3 10*3/uL (ref 3.4–10.8)

## 2020-07-05 LAB — T4, FREE: Free T4: 1.12 ng/dL (ref 0.82–1.77)

## 2020-07-07 ENCOUNTER — Encounter: Payer: Self-pay | Admitting: Internal Medicine

## 2020-07-21 DIAGNOSIS — G894 Chronic pain syndrome: Secondary | ICD-10-CM | POA: Diagnosis not present

## 2020-07-24 DIAGNOSIS — M179 Osteoarthritis of knee, unspecified: Secondary | ICD-10-CM | POA: Diagnosis not present

## 2020-07-24 DIAGNOSIS — G894 Chronic pain syndrome: Secondary | ICD-10-CM | POA: Diagnosis not present

## 2020-07-24 DIAGNOSIS — M25571 Pain in right ankle and joints of right foot: Secondary | ICD-10-CM | POA: Diagnosis not present

## 2020-07-24 DIAGNOSIS — M25572 Pain in left ankle and joints of left foot: Secondary | ICD-10-CM | POA: Diagnosis not present

## 2020-07-24 DIAGNOSIS — M5136 Other intervertebral disc degeneration, lumbar region: Secondary | ICD-10-CM | POA: Diagnosis not present

## 2020-07-24 DIAGNOSIS — M5459 Other low back pain: Secondary | ICD-10-CM | POA: Diagnosis not present

## 2020-07-24 DIAGNOSIS — M545 Low back pain, unspecified: Secondary | ICD-10-CM | POA: Diagnosis not present

## 2020-07-25 DIAGNOSIS — M25571 Pain in right ankle and joints of right foot: Secondary | ICD-10-CM | POA: Diagnosis not present

## 2020-07-25 DIAGNOSIS — M25572 Pain in left ankle and joints of left foot: Secondary | ICD-10-CM | POA: Diagnosis not present

## 2020-07-25 DIAGNOSIS — M5459 Other low back pain: Secondary | ICD-10-CM | POA: Diagnosis not present

## 2020-08-01 DIAGNOSIS — M5459 Other low back pain: Secondary | ICD-10-CM | POA: Diagnosis not present

## 2020-08-01 DIAGNOSIS — M25571 Pain in right ankle and joints of right foot: Secondary | ICD-10-CM | POA: Diagnosis not present

## 2020-08-01 DIAGNOSIS — M25572 Pain in left ankle and joints of left foot: Secondary | ICD-10-CM | POA: Diagnosis not present

## 2020-08-07 DIAGNOSIS — M25572 Pain in left ankle and joints of left foot: Secondary | ICD-10-CM | POA: Diagnosis not present

## 2020-08-07 DIAGNOSIS — M25571 Pain in right ankle and joints of right foot: Secondary | ICD-10-CM | POA: Diagnosis not present

## 2020-08-07 DIAGNOSIS — M5459 Other low back pain: Secondary | ICD-10-CM | POA: Diagnosis not present

## 2020-08-09 DIAGNOSIS — H40012 Open angle with borderline findings, low risk, left eye: Secondary | ICD-10-CM | POA: Diagnosis not present

## 2020-08-09 DIAGNOSIS — H401131 Primary open-angle glaucoma, bilateral, mild stage: Secondary | ICD-10-CM | POA: Diagnosis not present

## 2020-08-20 DIAGNOSIS — M545 Low back pain, unspecified: Secondary | ICD-10-CM | POA: Diagnosis not present

## 2020-08-20 DIAGNOSIS — M5136 Other intervertebral disc degeneration, lumbar region: Secondary | ICD-10-CM | POA: Diagnosis not present

## 2020-08-20 DIAGNOSIS — G8929 Other chronic pain: Secondary | ICD-10-CM | POA: Diagnosis not present

## 2020-08-21 DIAGNOSIS — G894 Chronic pain syndrome: Secondary | ICD-10-CM | POA: Diagnosis not present

## 2020-08-21 DIAGNOSIS — M5136 Other intervertebral disc degeneration, lumbar region: Secondary | ICD-10-CM | POA: Diagnosis not present

## 2020-08-21 DIAGNOSIS — M179 Osteoarthritis of knee, unspecified: Secondary | ICD-10-CM | POA: Diagnosis not present

## 2020-08-21 DIAGNOSIS — M545 Low back pain, unspecified: Secondary | ICD-10-CM | POA: Diagnosis not present

## 2020-08-22 DIAGNOSIS — M25572 Pain in left ankle and joints of left foot: Secondary | ICD-10-CM | POA: Diagnosis not present

## 2020-08-22 DIAGNOSIS — M25571 Pain in right ankle and joints of right foot: Secondary | ICD-10-CM | POA: Diagnosis not present

## 2020-08-22 DIAGNOSIS — M5459 Other low back pain: Secondary | ICD-10-CM | POA: Diagnosis not present

## 2020-08-28 DIAGNOSIS — M25572 Pain in left ankle and joints of left foot: Secondary | ICD-10-CM | POA: Diagnosis not present

## 2020-08-28 DIAGNOSIS — M25571 Pain in right ankle and joints of right foot: Secondary | ICD-10-CM | POA: Diagnosis not present

## 2020-08-28 DIAGNOSIS — M5459 Other low back pain: Secondary | ICD-10-CM | POA: Diagnosis not present

## 2020-08-31 ENCOUNTER — Encounter: Payer: Self-pay | Admitting: Internal Medicine

## 2020-09-18 DIAGNOSIS — M545 Low back pain, unspecified: Secondary | ICD-10-CM | POA: Diagnosis not present

## 2020-09-18 DIAGNOSIS — M792 Neuralgia and neuritis, unspecified: Secondary | ICD-10-CM | POA: Diagnosis not present

## 2020-09-18 DIAGNOSIS — M47897 Other spondylosis, lumbosacral region: Secondary | ICD-10-CM | POA: Diagnosis not present

## 2020-09-18 DIAGNOSIS — M4726 Other spondylosis with radiculopathy, lumbar region: Secondary | ICD-10-CM | POA: Diagnosis not present

## 2020-09-18 DIAGNOSIS — I119 Hypertensive heart disease without heart failure: Secondary | ICD-10-CM | POA: Diagnosis not present

## 2020-09-18 DIAGNOSIS — M179 Osteoarthritis of knee, unspecified: Secondary | ICD-10-CM | POA: Diagnosis not present

## 2020-09-18 DIAGNOSIS — G894 Chronic pain syndrome: Secondary | ICD-10-CM | POA: Diagnosis not present

## 2020-09-18 DIAGNOSIS — M25519 Pain in unspecified shoulder: Secondary | ICD-10-CM | POA: Diagnosis not present

## 2020-09-18 DIAGNOSIS — M5136 Other intervertebral disc degeneration, lumbar region: Secondary | ICD-10-CM | POA: Diagnosis not present

## 2020-09-18 DIAGNOSIS — M4807 Spinal stenosis, lumbosacral region: Secondary | ICD-10-CM | POA: Diagnosis not present

## 2020-09-18 DIAGNOSIS — M25559 Pain in unspecified hip: Secondary | ICD-10-CM | POA: Diagnosis not present

## 2020-09-18 DIAGNOSIS — Z8249 Family history of ischemic heart disease and other diseases of the circulatory system: Secondary | ICD-10-CM | POA: Diagnosis not present

## 2020-09-18 DIAGNOSIS — R609 Edema, unspecified: Secondary | ICD-10-CM | POA: Diagnosis not present

## 2020-09-18 DIAGNOSIS — M48062 Spinal stenosis, lumbar region with neurogenic claudication: Secondary | ICD-10-CM | POA: Diagnosis not present

## 2020-09-18 DIAGNOSIS — G8929 Other chronic pain: Secondary | ICD-10-CM | POA: Diagnosis not present

## 2020-09-19 ENCOUNTER — Encounter: Payer: Self-pay | Admitting: Internal Medicine

## 2020-09-19 DIAGNOSIS — G894 Chronic pain syndrome: Secondary | ICD-10-CM | POA: Diagnosis not present

## 2020-09-20 ENCOUNTER — Other Ambulatory Visit: Payer: Self-pay | Admitting: Internal Medicine

## 2020-10-14 ENCOUNTER — Other Ambulatory Visit: Payer: Self-pay | Admitting: Internal Medicine

## 2020-10-16 ENCOUNTER — Encounter: Payer: Self-pay | Admitting: Internal Medicine

## 2020-10-17 DIAGNOSIS — G894 Chronic pain syndrome: Secondary | ICD-10-CM | POA: Diagnosis not present

## 2020-10-17 DIAGNOSIS — M5136 Other intervertebral disc degeneration, lumbar region: Secondary | ICD-10-CM | POA: Diagnosis not present

## 2020-10-17 DIAGNOSIS — M179 Osteoarthritis of knee, unspecified: Secondary | ICD-10-CM | POA: Diagnosis not present

## 2020-10-17 DIAGNOSIS — M545 Low back pain, unspecified: Secondary | ICD-10-CM | POA: Diagnosis not present

## 2020-10-19 DIAGNOSIS — G894 Chronic pain syndrome: Secondary | ICD-10-CM | POA: Diagnosis not present

## 2020-10-19 DIAGNOSIS — M545 Low back pain, unspecified: Secondary | ICD-10-CM | POA: Diagnosis not present

## 2020-11-06 DIAGNOSIS — M47816 Spondylosis without myelopathy or radiculopathy, lumbar region: Secondary | ICD-10-CM | POA: Diagnosis not present

## 2020-11-06 DIAGNOSIS — M533 Sacrococcygeal disorders, not elsewhere classified: Secondary | ICD-10-CM | POA: Diagnosis not present

## 2020-11-13 DIAGNOSIS — M5136 Other intervertebral disc degeneration, lumbar region: Secondary | ICD-10-CM | POA: Diagnosis not present

## 2020-11-13 DIAGNOSIS — G894 Chronic pain syndrome: Secondary | ICD-10-CM | POA: Diagnosis not present

## 2020-11-13 DIAGNOSIS — M545 Low back pain, unspecified: Secondary | ICD-10-CM | POA: Diagnosis not present

## 2020-11-13 DIAGNOSIS — M179 Osteoarthritis of knee, unspecified: Secondary | ICD-10-CM | POA: Diagnosis not present

## 2020-11-18 DIAGNOSIS — G894 Chronic pain syndrome: Secondary | ICD-10-CM | POA: Diagnosis not present

## 2020-11-18 DIAGNOSIS — M259 Joint disorder, unspecified: Secondary | ICD-10-CM | POA: Diagnosis not present

## 2020-11-27 DIAGNOSIS — M533 Sacrococcygeal disorders, not elsewhere classified: Secondary | ICD-10-CM | POA: Diagnosis not present

## 2020-11-28 ENCOUNTER — Encounter: Payer: Self-pay | Admitting: Internal Medicine

## 2020-11-29 ENCOUNTER — Encounter: Payer: Self-pay | Admitting: Internal Medicine

## 2020-12-11 DIAGNOSIS — M5136 Other intervertebral disc degeneration, lumbar region: Secondary | ICD-10-CM | POA: Diagnosis not present

## 2020-12-11 DIAGNOSIS — M179 Osteoarthritis of knee, unspecified: Secondary | ICD-10-CM | POA: Diagnosis not present

## 2020-12-11 DIAGNOSIS — M545 Low back pain, unspecified: Secondary | ICD-10-CM | POA: Diagnosis not present

## 2020-12-11 DIAGNOSIS — G894 Chronic pain syndrome: Secondary | ICD-10-CM | POA: Diagnosis not present

## 2020-12-16 ENCOUNTER — Other Ambulatory Visit: Payer: Self-pay | Admitting: Internal Medicine

## 2020-12-18 DIAGNOSIS — G894 Chronic pain syndrome: Secondary | ICD-10-CM | POA: Diagnosis not present

## 2020-12-18 DIAGNOSIS — M545 Low back pain, unspecified: Secondary | ICD-10-CM | POA: Diagnosis not present

## 2020-12-18 DIAGNOSIS — M179 Osteoarthritis of knee, unspecified: Secondary | ICD-10-CM | POA: Diagnosis not present

## 2020-12-18 DIAGNOSIS — M5136 Other intervertebral disc degeneration, lumbar region: Secondary | ICD-10-CM | POA: Diagnosis not present

## 2020-12-20 DIAGNOSIS — H40012 Open angle with borderline findings, low risk, left eye: Secondary | ICD-10-CM | POA: Diagnosis not present

## 2020-12-20 DIAGNOSIS — H16223 Keratoconjunctivitis sicca, not specified as Sjogren's, bilateral: Secondary | ICD-10-CM | POA: Diagnosis not present

## 2020-12-20 DIAGNOSIS — H401131 Primary open-angle glaucoma, bilateral, mild stage: Secondary | ICD-10-CM | POA: Diagnosis not present

## 2021-01-08 ENCOUNTER — Other Ambulatory Visit: Payer: Self-pay

## 2021-01-08 ENCOUNTER — Ambulatory Visit (INDEPENDENT_AMBULATORY_CARE_PROVIDER_SITE_OTHER): Payer: Medicare Other | Admitting: Internal Medicine

## 2021-01-08 ENCOUNTER — Encounter: Payer: Self-pay | Admitting: Internal Medicine

## 2021-01-08 VITALS — BP 108/80 | HR 98 | Temp 98.6°F | Ht 66.4 in | Wt 265.4 lb

## 2021-01-08 DIAGNOSIS — G8929 Other chronic pain: Secondary | ICD-10-CM | POA: Diagnosis not present

## 2021-01-08 DIAGNOSIS — D509 Iron deficiency anemia, unspecified: Secondary | ICD-10-CM | POA: Diagnosis not present

## 2021-01-08 DIAGNOSIS — R202 Paresthesia of skin: Secondary | ICD-10-CM

## 2021-01-08 DIAGNOSIS — Z8249 Family history of ischemic heart disease and other diseases of the circulatory system: Secondary | ICD-10-CM | POA: Diagnosis not present

## 2021-01-08 DIAGNOSIS — E039 Hypothyroidism, unspecified: Secondary | ICD-10-CM

## 2021-01-08 DIAGNOSIS — M545 Low back pain, unspecified: Secondary | ICD-10-CM | POA: Diagnosis not present

## 2021-01-08 DIAGNOSIS — G894 Chronic pain syndrome: Secondary | ICD-10-CM | POA: Diagnosis not present

## 2021-01-08 DIAGNOSIS — M961 Postlaminectomy syndrome, not elsewhere classified: Secondary | ICD-10-CM

## 2021-01-08 DIAGNOSIS — M47897 Other spondylosis, lumbosacral region: Secondary | ICD-10-CM | POA: Diagnosis not present

## 2021-01-08 DIAGNOSIS — R635 Abnormal weight gain: Secondary | ICD-10-CM | POA: Diagnosis not present

## 2021-01-08 DIAGNOSIS — Z6841 Body Mass Index (BMI) 40.0 and over, adult: Secondary | ICD-10-CM

## 2021-01-08 DIAGNOSIS — M5136 Other intervertebral disc degeneration, lumbar region: Secondary | ICD-10-CM | POA: Diagnosis not present

## 2021-01-08 DIAGNOSIS — F331 Major depressive disorder, recurrent, moderate: Secondary | ICD-10-CM

## 2021-01-08 DIAGNOSIS — I119 Hypertensive heart disease without heart failure: Secondary | ICD-10-CM | POA: Diagnosis not present

## 2021-01-08 DIAGNOSIS — M25559 Pain in unspecified hip: Secondary | ICD-10-CM | POA: Diagnosis not present

## 2021-01-08 DIAGNOSIS — R609 Edema, unspecified: Secondary | ICD-10-CM | POA: Diagnosis not present

## 2021-01-08 DIAGNOSIS — M792 Neuralgia and neuritis, unspecified: Secondary | ICD-10-CM | POA: Diagnosis not present

## 2021-01-08 DIAGNOSIS — M48062 Spinal stenosis, lumbar region with neurogenic claudication: Secondary | ICD-10-CM | POA: Diagnosis not present

## 2021-01-08 NOTE — Patient Instructions (Signed)

## 2021-01-08 NOTE — Progress Notes (Signed)
I,Tianna Badgett,acting as a Neurosurgeon for [name], MD.,have documented all relevant documentation on the behalf of [name], MD,as directed by  [name], MD while in the presence of [name], MD.  This visit occurred during the SARS-CoV-2 public health emergency.  Safety protocols were in place, including screening questions prior to the visit, additional usage of staff PPE, and extensive cleaning of exam room while observing appropriate contact time as indicated for disinfecting solutions.  Subjective:     Patient ID: Natalie Guerrero , female    DOB: [birth year] , 55 y.o.   MRN: [id number]   Chief Complaint  Patient presents with   Thyroid Problem    HPI  The patient is here today for a thyroid follow-up.  She reports compliance with levothyroxine supplementation. She has not had any issues with the medication.   Thyroid Problem Presents for follow-up visit. Patient reports no diarrhea, dry skin, hoarse voice, menstrual problem or palpitations.    Past Medical History:  Diagnosis Date   Anxiety    Arthritis    DDD (degenerative disc disease)    Depression    Hypothyroidism 12/08/2012   Iron deficiency anemia, unspecified 11/10/2012   Iron malabsorption 01/20/2017   Obesity    Thyroid disease hypothroidism     Family History  Problem Relation Age of Onset   Prostate cancer Father    Heart failure Mother    Colon cancer Neg Hx    Diabetes Neg Hx    Kidney disease Neg Hx    Liver disease Neg Hx      Current Outpatient Medications:    Biotin 10 MG CAPS, Take 30 mg by mouth daily. , Disp: , Rfl:    brimonidine (ALPHAGAN) 0.2 % ophthalmic solution, Place 1 drop into the right eye 2 (two) times daily., Disp: , Rfl:    buPROPion (WELLBUTRIN XL) 150 MG 24 hr tablet, TAKE 1 TABLET BY MOUTH EVERY DAY, Disp: 90 tablet, Rfl: 1   CALCIUM+D3 600-800 MG-UNIT TABS, Take by mouth. 2 capsules daily, Disp: , Rfl:    Carboxymethylcellulose Sodium 1 %  GEL, Place 1-2 drops into both eyes 4 (four) times daily as needed (for dry eyes)., Disp: , Rfl:    cetirizine (ZYRTEC) 10 MG tablet, Take 10 mg by mouth daily., Disp: , Rfl:    Cholecalciferol (VITAMIN D3) 2000 units TABS, Take 10,000 Units by mouth daily., Disp: , Rfl:    COLLAGEN PO, Take 2 Scoops by mouth daily., Disp: , Rfl:    Cyanocobalamin (VITAMIN B-12) 5000 MCG SUBL, Take 10,000 mcg by mouth daily., Disp: , Rfl:    cycloSPORINE (RESTASIS) 0.05 % ophthalmic emulsion, Place 1 drop into both eyes 2 (two) times daily., Disp: , Rfl:    Dextromethorphan-guaiFENesin (MUCINEX DM MAXIMUM STRENGTH) 60-1200 MG TB12, Take 1 tablet by mouth 2 (two) times daily., Disp: , Rfl:    diphenhydrAMINE (BENADRYL) 25 mg capsule, Take 50 mg by mouth 2 (two) times daily., Disp: , Rfl:    docusate sodium (COLACE) 100 MG capsule, Take 200 mg by mouth 2 (two) times daily. , Disp: , Rfl:    FIBER PO, Take 4 capsules by mouth daily. prn, Disp: , Rfl:    ibuprofen (ADVIL) 800 MG tablet, Take 800 mg by mouth 3 (three) times daily as needed., Disp: , Rfl:    imipramine (TOFRANIL) 25 MG tablet, Take 25 mg by mouth 3 (three) times daily. 3 tabs 2 times per day, Disp: ,  Rfl: 4   levothyroxine (SYNTHROID) 100 MCG tablet, TAKE ONE TABLET M-F AND TAKE 1/2 PILL ON SATURDAY/SUNDAY., Disp: 90 tablet, Rfl: 0   Liniments (SALONPAS PAIN RELIEF PATCH EX), Apply 1 patch topically daily., Disp: , Rfl:    MAGNESIUM PO, Take 5 tablets by mouth daily. , Disp: , Rfl:    omeprazole (PRILOSEC) 40 MG capsule, TAKE 1 CAPSULE BY MOUTH EVERY DAY, Disp: 90 capsule, Rfl: 1   OVER THE COUNTER MEDICATION, Apply 1 application topically 2 (two) times daily. [name] and Blackley Arthritis Cream, Disp: , Rfl:    oxyCODONE (ROXICODONE) 15 MG immediate release tablet, every 4 (four) hours as needed. 1, Disp: , Rfl:    tiZANidine (ZANAFLEX) 4 MG tablet, , Disp: , Rfl:    topiramate (TOPAMAX) 100 MG tablet, Take 100 mg by mouth 4 (four) times daily. , Disp: ,  Rfl: 5   Turmeric Curcumin 500 MG CAPS, Take 1,000 mg by mouth 2 (two) times daily., Disp: , Rfl:    valACYclovir (VALTREX) 500 MG tablet, SMARTSIG:1 Tablet(s) By Mouth Every 12 Hours PRN, Disp: , Rfl:    ZINC-VITAMIN C PO, Take 6 tablets by mouth daily., Disp: , Rfl:    Allergies  Allergen Reactions   Codeine Itching     Review of Systems  Constitutional: Negative.   HENT:  Negative for hoarse voice.   Respiratory: Negative.    Cardiovascular: Negative.  Negative for palpitations.  Gastrointestinal: Negative.  Negative for diarrhea.  Genitourinary:  Negative for menstrual problem.  Neurological:  Positive for numbness.       She c/o numbness b/l LE. This is not a new complaint. Has chronic back pain. Denies LE weakness.     Today's Vitals   01/08/21 1046  BP: 108/80  Pulse: 98  Temp: 98.6 F (37 C)  TempSrc: Oral  Weight: 265 lb 6.4 oz (120.4 kg)  Height: 5' 6.4" (1.687 m)   Body mass index is 42.32 kg/m.  Wt Readings from Last 3 Encounters:  01/08/21 265 lb 6.4 oz (120.4 kg)  07/04/20 266 lb (120.7 kg)  04/12/20 260 lb (117.9 kg)    Objective:  Physical Exam Vitals and nursing note reviewed.  Constitutional:      Appearance: Normal appearance. She is obese.  HENT:     Head: Normocephalic and atraumatic.     Nose:     Comments: Masked     Mouth/Throat:     Comments: Masked  Eyes:     Extraocular Movements: Extraocular movements intact.  Cardiovascular:     Rate and Rhythm: Normal rate and regular rhythm.     Heart sounds: Normal heart sounds.  Pulmonary:     Effort: Pulmonary effort is normal.     Breath sounds: Normal breath sounds.  Musculoskeletal:     Cervical back: Normal range of motion.  Skin:    General: Skin is warm.  Neurological:     General: No focal deficit present.     Mental Status: She is alert.  Psychiatric:        Mood and Affect: Mood normal.        Behavior: Behavior normal.        Assessment And Plan:     1. Primary  hypothyroidism Comments: I will check thyroid function. Last TSH was low. I will adjust meds as needed.  - TSH + free T4  2. Iron deficiency anemia, unspecified iron deficiency anemia type Comments: I will check CBC/ iron panel today. I  will make further recommendations once her labs are available today.  - CBC no Diff - Iron, TIBC and Ferritin Panel  3. Moderate episode of recurrent major depressive disorder (HCC) Comments: Chronic, she will continue with current meds.   4. Post laminectomy syndrome Comments: I will resume gabapentin 100mg  nightly, and increase dose by 100mg  weekly until 300mg  nightly.   5. Weight gain Comments: Will check insulin level. Denies family history of thyroid cancer. Will consider semaglutide for weight loss.  - Insulin, random(561)  6. Paresthesia of both lower extremities Comments: I will refer her to Neuro for nerve conduction study.  She agrees to referral.  - Ambulatory referral to Neurology  7. Class 3 severe obesity due to excess calories with serious comorbidity and body mass index (BMI) of 40.0 to 44.9 in adult [facility]) Comments: BMI 42. She is s/p gastric bypass 2007. She is encouraged to strive for BMi less than 35 to decrease cardiac risk.   Patient was given opportunity to ask questions. Patient verbalized understanding of the plan and was able to repeat key elements of the plan. All questions were answered to their satisfaction.   I, , MD, have reviewed all documentation for this visit. The documentation on 01/08/21 for the exam, diagnosis, procedures, and orders are all accurate and complete.   IF YOU HAVE BEEN REFERRED TO A SPECIALIST, IT MAY TAKE 1-2 WEEKS TO SCHEDULE/PROCESS THE REFERRAL. IF YOU HAVE NOT HEARD FROM US/SPECIALIST IN TWO WEEKS, PLEASE GIVE 2008 A CALL AT [phone] X [phone].   THE PATIENT IS ENCOURAGED TO PRACTICE SOCIAL DISTANCING DUE TO THE COVID-19 PANDEMIC.

## 2021-01-09 LAB — IRON,TIBC AND FERRITIN PANEL
Ferritin: 9 ng/mL — ABNORMAL LOW (ref 15–150)
Iron Saturation: 17 % (ref 15–55)
Iron: 55 ug/dL (ref 27–159)
Total Iron Binding Capacity: 315 ug/dL (ref 250–450)
UIBC: 260 ug/dL (ref 131–425)

## 2021-01-09 LAB — CBC
Hematocrit: 31.7 % — ABNORMAL LOW (ref 34.0–46.6)
Hemoglobin: 10 g/dL — ABNORMAL LOW (ref 11.1–15.9)
MCH: 29.2 pg (ref 26.6–33.0)
MCHC: 31.5 g/dL (ref 31.5–35.7)
MCV: 93 fL (ref 79–97)
Platelets: 375 10*3/uL (ref 150–450)
RBC: 3.42 x10E6/uL — ABNORMAL LOW (ref 3.77–5.28)
RDW: 12.8 % (ref 11.7–15.4)
WBC: 3.9 10*3/uL (ref 3.4–10.8)

## 2021-01-09 LAB — INSULIN, RANDOM: INSULIN: 5.8 u[IU]/mL (ref 2.6–24.9)

## 2021-01-09 LAB — TSH+FREE T4
Free T4: 1.13 ng/dL (ref 0.82–1.77)
TSH: 0.094 u[IU]/mL — ABNORMAL LOW (ref 0.450–4.500)

## 2021-01-11 ENCOUNTER — Other Ambulatory Visit: Payer: Self-pay

## 2021-01-11 DIAGNOSIS — E039 Hypothyroidism, unspecified: Secondary | ICD-10-CM

## 2021-01-14 ENCOUNTER — Encounter: Payer: Self-pay | Admitting: Internal Medicine

## 2021-01-25 ENCOUNTER — Encounter: Payer: Self-pay | Admitting: Internal Medicine

## 2021-01-28 ENCOUNTER — Telehealth: Payer: Self-pay

## 2021-01-28 NOTE — Telephone Encounter (Signed)
Patient called and notified that her handicap placard is ready for pick up and has been placed up front. YL,RMA

## 2021-02-06 DIAGNOSIS — G894 Chronic pain syndrome: Secondary | ICD-10-CM | POA: Diagnosis not present

## 2021-02-17 ENCOUNTER — Other Ambulatory Visit: Payer: Self-pay | Admitting: Internal Medicine

## 2021-02-21 ENCOUNTER — Encounter: Payer: Self-pay | Admitting: Family

## 2021-02-22 DIAGNOSIS — M791 Myalgia, unspecified site: Secondary | ICD-10-CM | POA: Diagnosis not present

## 2021-02-22 DIAGNOSIS — M48062 Spinal stenosis, lumbar region with neurogenic claudication: Secondary | ICD-10-CM | POA: Diagnosis not present

## 2021-02-22 DIAGNOSIS — M47816 Spondylosis without myelopathy or radiculopathy, lumbar region: Secondary | ICD-10-CM | POA: Diagnosis not present

## 2021-02-23 DIAGNOSIS — R69 Illness, unspecified: Secondary | ICD-10-CM | POA: Diagnosis not present

## 2021-02-28 ENCOUNTER — Other Ambulatory Visit: Payer: Medicare HMO

## 2021-03-12 DIAGNOSIS — M47816 Spondylosis without myelopathy or radiculopathy, lumbar region: Secondary | ICD-10-CM | POA: Diagnosis not present

## 2021-03-14 DIAGNOSIS — M545 Low back pain, unspecified: Secondary | ICD-10-CM | POA: Diagnosis not present

## 2021-03-16 ENCOUNTER — Other Ambulatory Visit: Payer: Self-pay | Admitting: Internal Medicine

## 2021-03-16 DIAGNOSIS — R69 Illness, unspecified: Secondary | ICD-10-CM | POA: Diagnosis not present

## 2021-03-25 ENCOUNTER — Ambulatory Visit: Payer: Medicare Other | Admitting: Neurology

## 2021-03-28 ENCOUNTER — Ambulatory Visit: Payer: Medicare HMO | Admitting: Neurology

## 2021-03-28 ENCOUNTER — Encounter: Payer: Self-pay | Admitting: Neurology

## 2021-03-28 VITALS — BP 128/84 | HR 92 | Ht 66.0 in | Wt 267.2 lb

## 2021-03-28 DIAGNOSIS — D508 Other iron deficiency anemias: Secondary | ICD-10-CM | POA: Diagnosis not present

## 2021-03-28 DIAGNOSIS — M5440 Lumbago with sciatica, unspecified side: Secondary | ICD-10-CM

## 2021-03-28 DIAGNOSIS — R200 Anesthesia of skin: Secondary | ICD-10-CM | POA: Diagnosis not present

## 2021-03-28 DIAGNOSIS — E038 Other specified hypothyroidism: Secondary | ICD-10-CM | POA: Diagnosis not present

## 2021-03-28 DIAGNOSIS — G8929 Other chronic pain: Secondary | ICD-10-CM | POA: Diagnosis not present

## 2021-03-28 DIAGNOSIS — R202 Paresthesia of skin: Secondary | ICD-10-CM | POA: Diagnosis not present

## 2021-03-28 NOTE — Progress Notes (Signed)
Subjective:    Patient ID: Natalie Guerrero is a 56 y.o. female.  HPI    Star Age, MD, PhD [facility] [address], [address] [place], [place] [address]  Dear Dr. Baird Cancer,   I saw your patient, Natalie Guerrero, upon your kind request in my neurologic clinic today for initial consultation of her lower extremity paresthesias, in particular, consideration for EMG testing.  The patient is unaccompanied today.  As you know, Natalie Guerrero is a 56 year old right-handed woman with an underlying medical history of hypothyroidism, arthritis, anxiety, depression, degenerative disc disease, iron deficiency anemia, and severe obesity with a BMI of over 40, who reports a longstanding history of numbness and tingling affecting both lower extremities particularly both feet.  She has ongoing issues with low back pain and sees pain management, Dr. Primus Bravo as well as Dr. Mina Marble for injections into the facet joints on a regular basis, approximately every 3 months.  I reviewed your office note from 11/08/2020.  Of note, she has standing history of low back pain and has had lumbar laminectomy in 2012.  She has been on gabapentin but is no longer on it.  She is on multiple psychotropic medications including high-dose Topamax 100 mg 4 times a day, she also takes oxycodone as needed, is on Wellbutrin long-acting once daily 150 mg strength, Benadryl, Zanaflex 4 mg strength as needed, also on imipramine 75 mg twice daily.  She used to see Dr. Ace Gins.  She had blood work through your office on 01/08/2021, I was able to review the results, TSH was below normal at 0.094, free T4 in the normal range at 1.13, CBC showed mildly low hemoglobin but stable at 10, hematocrit below normal at 31.7 but also stable.  Ferritin was below normal at 9. She had a lumbar spine CT with contrast on 07/17/2010 and I was able to review the results:  IMPRESSION:  L5-S1 degenerative disc disease with grade III  right posterolateral  annular tearing and severe L5-S1 bilateral facet arthrosis.   L4-L5 and L3-L4 moderate to severe bilateral facet degeneration  with disc bulging at both levels.   She has not had any recent falls.  She does not use a walking aid consistently.  She reports feeling heavy in her legs and considering liposuction to her legs as she feels that her legs size interfere with her mobility as well.   She reports that Dr. Primus Bravo ordered a lumbar spine MRI, results were not available for my review today, she reports that she had the scan about 2 weeks ago.  Her Past Medical History Is Significant For: Past Medical History:  Diagnosis Date   Anxiety    Arthritis    DDD (degenerative disc disease)    Depression    Hypothyroidism 12/08/2012   Iron deficiency anemia, unspecified 11/10/2012   Iron malabsorption 01/20/2017   Obesity    Thyroid disease hypothroidism    Her Past Surgical History Is Significant For: Past Surgical History:  Procedure Laterality Date   [place] N/A 12/22/2017   Procedure: LAPAROSCOPIC CHOLECYSTECTOMY;  Surgeon: Ralene Ok, MD;  Location: [facility];  Service: General;  Laterality: N/A;   GASTRIC BYPASS  2007   KNEE SURGERY Left 2000    Her Family History Is Significant For: Family History  Problem Relation Age of Onset   Heart failure Mother    Prostate cancer  Father    Colon cancer Neg Hx    Diabetes Neg Hx    Kidney disease Neg Hx    Liver disease Neg Hx    Neuropathy Neg Hx     Her Social History Is Significant For: Social History   Socioeconomic History   Marital status: Married    Spouse name: Not on file   Number of children: Not on file   Years of education: Not on file   Highest education level: Not on file  Occupational History   Occupation: disability  Tobacco Use   Smoking status: Never   Smokeless tobacco: Never  Vaping Use   Vaping  Use: Never used  Substance and Sexual Activity   Alcohol use: Not Currently   Drug use: Yes    Types: Oxycodone   Sexual activity: Yes  Other Topics Concern   Not on file  Social History Narrative   Not on file   Social Determinants of Health   Financial Resource Strain: Low Risk    Difficulty of Paying Living Expenses: Not hard at all  Food Insecurity: No Food Insecurity   Worried About Programme researcher, broadcasting/film/video in the Last Year: Never true   Ran Out of Food in the Last Year: Never true  Transportation Needs: No Transportation Needs   Lack of Transportation (Medical): No   Lack of Transportation (Non-Medical): No  Physical Activity: Insufficiently Active   Days of Exercise per Week: 2 days   Minutes of Exercise per Session: 30 min  Stress: No Stress Concern Present   Feeling of Stress : Not at all  Social Connections: Not on file    Her Allergies Are:  Allergies  Allergen Reactions   Codeine Itching  :   Her Current Medications Are:  Outpatient Encounter Medications as of 03/28/2021  Medication Sig   Biotin 10 MG CAPS Take 30 mg by mouth daily.    brimonidine (ALPHAGAN) 0.2 % ophthalmic solution Place 1 drop into the right eye 2 (two) times daily.   buPROPion (WELLBUTRIN XL) 150 MG 24 hr tablet TAKE 1 TABLET BY MOUTH EVERY DAY   CALCIUM+D3 600-800 MG-UNIT TABS Take by mouth. 2 capsules daily   cetirizine (ZYRTEC) 10 MG tablet Take 10 mg by mouth daily.   Cholecalciferol (VITAMIN D3) 2000 units TABS Take 10,000 Units by mouth daily.   COLLAGEN PO Take 2 Scoops by mouth daily.   Cyanocobalamin (VITAMIN B-12) 5000 MCG SUBL Take 10,000 mcg by mouth daily.   cycloSPORINE (RESTASIS) 0.05 % ophthalmic emulsion Place 1 drop into both eyes 2 (two) times daily.   Dextromethorphan-guaiFENesin (MUCINEX DM MAXIMUM STRENGTH) 60-1200 MG TB12 Take 1 tablet by mouth 2 (two) times daily.   diphenhydrAMINE (BENADRYL) 25 mg capsule Take 50 mg by mouth 2 (two) times daily.   docusate sodium  (COLACE) 100 MG capsule Take 200 mg by mouth 2 (two) times daily.    FIBER PO Take 4 capsules by mouth daily. prn   ibuprofen (ADVIL) 800 MG tablet Take 800 mg by mouth 3 (three) times daily as needed.   imipramine (TOFRANIL) 25 MG tablet Take 25 mg by mouth 3 (three) times daily. 3 tabs 2 times per day   levothyroxine (SYNTHROID) 100 MCG tablet TAKE ONE TABLET M-F AND TAKE 1/2 PILL ON SATURDAY/SUNDAY. (Patient taking differently: daily. take one tablet M-F)   Liniments (SALONPAS PAIN RELIEF PATCH EX) Apply 1 patch topically daily.   MAGNESIUM PO Take 5 tablets by mouth daily.  MENTHOL-METHYL SALICYLATE EX Apply topically. Apply one patch topically daily   omeprazole (PRILOSEC) 40 MG capsule TAKE 1 CAPSULE BY MOUTH EVERY DAY   OVER THE COUNTER MEDICATION Apply 1 application topically 2 (two) times daily. Olevia Bowens and Blackley Arthritis Cream   oxyCODONE (ROXICODONE) 15 MG immediate release tablet 20 mg every 4 (four) hours as needed. 1   tiZANidine (ZANAFLEX) 4 MG tablet every 6 (six) hours as needed.   topiramate (TOPAMAX) 100 MG tablet Take 100 mg by mouth 4 (four) times daily.    Turmeric Curcumin 500 MG CAPS Take 1,000 mg by mouth 2 (two) times daily.   valACYclovir (VALTREX) 500 MG tablet SMARTSIG:1 Tablet(s) By Mouth Every 12 Hours PRN   ZINC-VITAMIN C PO Take 6 tablets by mouth daily.   Carboxymethylcellulose Sodium 1 % GEL Place 1-2 drops into both eyes 4 (four) times daily as needed (for dry eyes).   No facility-administered encounter medications on file as of 03/28/2021.  :   Review of Systems:  Out of a complete 14 point review of systems, all are reviewed and negative with the exception of these symptoms as listed below:  Review of Systems  Neurological:        Pt is here for  paresthesia in lower extremities. Pt states she is having sciatica pain lower back and legs. Pt states she is having pin and needle feelings and numbness in  both legs,feet and toes    Objective:   Neurological Exam  Physical Exam Physical Examination:   Vitals:   03/28/21 0949  BP: 128/84  Pulse: 92    General Examination: The patient is a very pleasant 56 y.o. female in no acute distress. She appears well-developed and well-nourished and well groomed.   HEENT: Normocephalic, atraumatic, pupils are equal, round and reactive to light, extraocular tracking is good without limitation to gaze excursion or nystagmus noted. Hearing is grossly intact to tuning fork. Face is symmetric with normal facial animation and normal facial sensation. Speech is clear with no dysarthria noted. There is no hypophonia. There is no lip, neck/head, jaw or voice tremor. Neck is supple with full range of passive and active motion. There are no carotid bruits on auscultation. Oropharynx exam reveals: mild mouth dryness, adequate dental hygiene. Tongue protrudes centrally and palate elevates symmetrically.   Chest: Clear to auscultation without wheezing, rhonchi or crackles noted.  Heart: S1+S2+0, regular and normal without murmurs, rubs or gallops noted.   Abdomen: Soft, non-tender and non-distended.  Extremities: There is no pitting edema in the distal lower extremities bilaterally.   Skin: Warm and dry without trophic changes noted.   Musculoskeletal: exam reveals no obvious joint deformities, tenderness or joint swelling or erythema.   Neurologically:  Mental status: The patient is awake, alert and oriented in all 4 spheres. Her immediate and remote memory, attention, language skills and fund of knowledge are appropriate. There is no evidence of aphasia, agnosia, apraxia or anomia. Speech is clear with normal prosody and enunciation. Thought process is linear. Mood is normal and affect is normal.  Cranial nerves II - XII are as described above under HEENT exam.  Motor exam: Normal bulk, strength and tone is noted, she has some giveaway weakness with foot dorsiflexion bilaterally in hip flexion  bilaterally.  She reports pain in the lower back.  Reflexes are 1-2+ in the upper extremities, difficult to elicit in the lower extremities, toes are downgoing bilaterally. Fine motor skills and coordination: grossly intact with finger taps, hand  movements, rapid alternating patting and foot tapping.   Cerebellar testing: No dysmetria or intention tremor. There is no truncal or gait ataxia.  Finger-to-nose unremarkable, heel-to-shin difficult, likely secondary to body habitus, no obvious dysmetria. Sensory exam: intact to light, pinprick, temperature and vibration sense in the upper and lower extremities.  Gait, station and balance: She stands with mild difficulty, does not need assistance, she is able to walk without a walking aid.   Assessment and Plan:   In summary, Natalie Guerrero is a very pleasant 55 y.o.-year old female with an underlying medical history of hypothyroidism, arthritis, anxiety, depression, degenerative disc disease, iron deficiency anemia, and severe obesity with a BMI of over 40, who presents for evaluation of her numbness and tingling affecting both lower extremities, symptoms have been ongoing for years, essentially since 2012.  She has not had much in the way of weakness but does report ongoing discomfort in her lower back and sees pain management for this as well as Dr. Mina Marble on a regular basis for facet injections.  Examination is not telltale for peripheral neuropathy.  She is certainly may have radiculopathy.  Examination is without telltale for any underlying neuromuscular disease.  She is advised to proceed with additional work-up from my end of things with lab work to look for underlying treatable causes of neuropathy.  We talked about the common causes for neuropathy particularly diabetes, prediabetes, obesity which can increase the risk for peripheral neuropathy.  She is advised to continue to work on weight loss.  She is considering liposuction as I understand,  she feels that the leg weakness may tie in with the heaviness of her legs.  This is certainly a possibility.  She is advised to continue follow-up with her pulm specialist providers.  We will go ahead and proceed with EMG nerve conduction velocity testing.  I have placed an order for this through our office.  I am not quite sure how much I can contribute to her care, but I am happy to aid in evaluation. She is established with pain management for symptomatic treatment and is in fact on multiple psychotropic medications.  She is advised that certain medications can cause paresthesias including topiramate which is currently on a fairly high dose in her case.  We will keep her posted as to her results by phone call for now.  I am also not fully sure how effective EMG and nerve conduction velocity testing will be in her case but I will have one of my neuromuscular colleagues use their expertise in her testing.  I answered all her questions today and she was in agreement. Thank you very much for allowing me to participate in the care of this nice patient. If I can be of any further assistance to you please do not hesitate to call me at [phone].  Sincerely,   Star Age, MD, PhD

## 2021-03-28 NOTE — Patient Instructions (Signed)
It was nice to meet you today.  You have had a longstanding history of low back pain and it is likely that your leg and feet numbness and tingling are at least in part from degenerative back disease.  As discussed, we will check blood work today and call you with the test results. We will do an EMG and nerve conduction velocity test, which is an electrical nerve and muscle test, which we will schedule. We will call you with the results.

## 2021-03-30 DIAGNOSIS — F4323 Adjustment disorder with mixed anxiety and depressed mood: Secondary | ICD-10-CM | POA: Diagnosis not present

## 2021-03-30 DIAGNOSIS — R69 Illness, unspecified: Secondary | ICD-10-CM | POA: Diagnosis not present

## 2021-04-01 LAB — SEDIMENTATION RATE: Sed Rate: 20 mm/hr (ref 0–40)

## 2021-04-01 LAB — VITAMIN D 25 HYDROXY (VIT D DEFICIENCY, FRACTURES): Vit D, 25-Hydroxy: 60.5 ng/mL (ref 30.0–100.0)

## 2021-04-01 LAB — MULTIPLE MYELOMA PANEL, SERUM
Albumin SerPl Elph-Mcnc: 3.2 g/dL (ref 2.9–4.4)
Albumin/Glob SerPl: 1.2 (ref 0.7–1.7)
Alpha 1: 0.2 g/dL (ref 0.0–0.4)
Alpha2 Glob SerPl Elph-Mcnc: 0.6 g/dL (ref 0.4–1.0)
B-Globulin SerPl Elph-Mcnc: 0.9 g/dL (ref 0.7–1.3)
Gamma Glob SerPl Elph-Mcnc: 1.2 g/dL (ref 0.4–1.8)
Globulin, Total: 2.9 g/dL (ref 2.2–3.9)
IgA/Immunoglobulin A, Serum: 190 mg/dL (ref 87–352)
IgG (Immunoglobin G), Serum: 1206 mg/dL (ref 586–1602)
IgM (Immunoglobulin M), Srm: 181 mg/dL (ref 26–217)
Total Protein: 6.1 g/dL (ref 6.0–8.5)

## 2021-04-01 LAB — VITAMIN B1: Thiamine: 123 nmol/L (ref 66.5–200.0)

## 2021-04-01 LAB — RHEUMATOID FACTOR: Rheumatoid fact SerPl-aCnc: <10 IU/mL (ref <14.0)

## 2021-04-01 LAB — B12 AND FOLATE PANEL
Folate: >20 ng/mL (ref >3.0)
Vitamin B-12: >2000 pg/mL — ABNORMAL HIGH (ref 232–1245)

## 2021-04-01 LAB — RPR: RPR Ser Ql: NONREACTIVE

## 2021-04-01 LAB — ANA W/REFLEX: Anti Nuclear Antibody (ANA): NEGATIVE

## 2021-04-01 LAB — VITAMIN B6: Vitamin B6: 25.3 ug/L (ref 3.4–65.2)

## 2021-04-01 LAB — HGB A1C W/O EAG: Hgb A1c MFr Bld: 5.6 % (ref 4.8–5.6)

## 2021-04-01 LAB — C-REACTIVE PROTEIN: CRP: 7 mg/L (ref 0–10)

## 2021-04-02 DIAGNOSIS — M5136 Other intervertebral disc degeneration, lumbar region: Secondary | ICD-10-CM | POA: Diagnosis not present

## 2021-04-02 DIAGNOSIS — G8929 Other chronic pain: Secondary | ICD-10-CM | POA: Diagnosis not present

## 2021-04-02 DIAGNOSIS — M545 Low back pain, unspecified: Secondary | ICD-10-CM | POA: Diagnosis not present

## 2021-04-02 DIAGNOSIS — G894 Chronic pain syndrome: Secondary | ICD-10-CM | POA: Diagnosis not present

## 2021-04-03 ENCOUNTER — Other Ambulatory Visit: Payer: Self-pay | Admitting: Internal Medicine

## 2021-04-05 ENCOUNTER — Encounter: Payer: Self-pay | Admitting: Family

## 2021-04-08 ENCOUNTER — Ambulatory Visit: Payer: Medicare HMO | Admitting: Podiatry

## 2021-04-08 ENCOUNTER — Ambulatory Visit (INDEPENDENT_AMBULATORY_CARE_PROVIDER_SITE_OTHER): Payer: Medicare HMO

## 2021-04-08 ENCOUNTER — Ambulatory Visit: Payer: Medicare HMO

## 2021-04-08 ENCOUNTER — Other Ambulatory Visit: Payer: Self-pay | Admitting: Internal Medicine

## 2021-04-08 ENCOUNTER — Other Ambulatory Visit: Payer: Self-pay

## 2021-04-08 DIAGNOSIS — M7750 Other enthesopathy of unspecified foot: Secondary | ICD-10-CM

## 2021-04-08 DIAGNOSIS — M79671 Pain in right foot: Secondary | ICD-10-CM | POA: Diagnosis not present

## 2021-04-08 DIAGNOSIS — M79672 Pain in left foot: Secondary | ICD-10-CM

## 2021-04-08 DIAGNOSIS — Z1231 Encounter for screening mammogram for malignant neoplasm of breast: Secondary | ICD-10-CM

## 2021-04-08 MED ORDER — TRIAMCINOLONE ACETONIDE 10 MG/ML IJ SUSP
20.0000 mg | Freq: Once | INTRAMUSCULAR | Status: AC
  Administered 2021-04-09: 20 mg

## 2021-04-08 NOTE — Patient Instructions (Signed)

## 2021-04-09 ENCOUNTER — Encounter: Payer: Self-pay | Admitting: Podiatry

## 2021-04-09 DIAGNOSIS — M7750 Other enthesopathy of unspecified foot: Secondary | ICD-10-CM | POA: Diagnosis not present

## 2021-04-09 DIAGNOSIS — M533 Sacrococcygeal disorders, not elsewhere classified: Secondary | ICD-10-CM | POA: Diagnosis not present

## 2021-04-09 DIAGNOSIS — M47816 Spondylosis without myelopathy or radiculopathy, lumbar region: Secondary | ICD-10-CM | POA: Diagnosis not present

## 2021-04-09 DIAGNOSIS — M79671 Pain in right foot: Secondary | ICD-10-CM | POA: Diagnosis not present

## 2021-04-09 DIAGNOSIS — M79672 Pain in left foot: Secondary | ICD-10-CM | POA: Diagnosis not present

## 2021-04-09 MED ORDER — TRIAMCINOLONE ACETONIDE 10 MG/ML IJ SUSP: 20.0000 mg | Freq: Once | INTRAMUSCULAR | Status: DC

## 2021-04-09 NOTE — Progress Notes (Signed)
Subjective:   Patient ID: Natalie Guerrero, female   DOB: 56 y.o.   MRN: [id number]   HPI Patient presents with exquisite discomfort in both the plantar heel region both feet also in the back of the heels and outside of the feet.  States that heel seem to be the starting point and its been going on for at least 6 months gradually becoming more of an issue for her.  Patient does not smoke likes to be active and knows that she has flatfeet with moderate obesity   Review of Systems  All other systems reviewed and are negative.      Objective:  Physical Exam Vitals and nursing note reviewed.  Constitutional:      Appearance: She is well-developed.  Pulmonary:     Effort: Pulmonary effort is normal.  Musculoskeletal:        General: Normal range of motion.  Skin:    General: Skin is warm.  Neurological:     Mental Status: She is alert.    Neurovascular status intact muscle strength found to be adequate range of motion adequate with exquisite discomfort in the plantar aspects of both heels moderate discomfort posterior heel moderate discomfort lateral side of the foot and into the sinus tarsi bilateral.  Patient is found to have good digital perfusion well oriented and has flatfoot deformity bilateral     Assessment:  Acute plantar fasciitis bilateral with inflammation along with sinus tarsitis posterior pain bilateral     Plan:  H&P x-rays reviewed all conditions discussed and at this point sterile prep and injected the plantar fascial bilateral 3 mg Kenalog 5 mg Xylocaine applied fascial brace bilateral due to the instability of the arch to both lift up the arch and take pressure off the heels and discuss possible further treatments depending on response  X-rays indicate small spurs no indication stress fracture or significant collapse medial longitudinal arch

## 2021-04-10 ENCOUNTER — Other Ambulatory Visit: Payer: Self-pay | Admitting: Podiatry

## 2021-04-10 ENCOUNTER — Ambulatory Visit: Payer: Self-pay | Admitting: Neurology

## 2021-04-10 MED ORDER — PREDNISONE 10 MG PO TABS: ORAL_TABLET | ORAL | 0 refills | Status: DC

## 2021-04-11 ENCOUNTER — Ambulatory Visit (INDEPENDENT_AMBULATORY_CARE_PROVIDER_SITE_OTHER): Payer: Medicare HMO | Admitting: Internal Medicine

## 2021-04-11 ENCOUNTER — Other Ambulatory Visit: Payer: Self-pay

## 2021-04-11 ENCOUNTER — Encounter: Payer: Self-pay | Admitting: Internal Medicine

## 2021-04-11 VITALS — BP 120/80 | HR 89 | Temp 98.6°F | Ht 64.4 in | Wt 259.8 lb

## 2021-04-11 DIAGNOSIS — Z23 Encounter for immunization: Secondary | ICD-10-CM | POA: Diagnosis not present

## 2021-04-11 DIAGNOSIS — N6310 Unspecified lump in the right breast, unspecified quadrant: Secondary | ICD-10-CM | POA: Diagnosis not present

## 2021-04-11 DIAGNOSIS — N6342 Unspecified lump in left breast, subareolar: Secondary | ICD-10-CM | POA: Diagnosis not present

## 2021-04-11 DIAGNOSIS — Z6841 Body Mass Index (BMI) 40.0 and over, adult: Secondary | ICD-10-CM | POA: Diagnosis not present

## 2021-04-11 DIAGNOSIS — Z2821 Immunization not carried out because of patient refusal: Secondary | ICD-10-CM

## 2021-04-11 DIAGNOSIS — E66813 Obesity, class 3: Secondary | ICD-10-CM

## 2021-04-11 NOTE — Patient Instructions (Signed)
Dandelion root tea ? ?Fibrocystic Breast Changes ?Fibrocystic breast changes are changes in breast tissue that can cause breasts to become swollen, lumpy, or painful. This can happen due to buildup of scar-like tissue (fibrous tissue) or the forming of fluid-filled lumps (cysts) in the breast. Fibrocystic breast changes can affect one or both breasts. The condition is common, and it is not cancer. ?What are the causes? ?The exact cause of fibrocystic breast changes is not known. However, this condition may be: ?Related to the female hormones estrogen and progesterone. ?Influenced by family traits that get passed from parent to child (inherited). ?What are the signs or symptoms? ?Symptoms of this condition include: ?Tenderness, swelling, mild discomfort, or pain. ?Rope-like tissue that can be felt when touching the breast. ?Lumps in one or both breasts. ?Changes in breast size. Breasts may get larger before a menstrual period and smaller after a menstrual period. ?Discharge from the nipple. ?Symptoms of this condition may affect one or both breasts and are usually worse before menstrual periods start. Symptoms usually get better toward the end of menstrual periods. ?How is this diagnosed? ?This condition is diagnosed based on your medical history and a physical exam of your breasts. You may also have tests, such as: ?A breast X-ray (mammogram). ?Ultrasound. ?MRI. ?Removing a small sample of tissue from the breast for tests (breast biopsy). This may be done if your health care provider thinks that something else may be causing changes in your breasts. ?How is this treated? ?Often, treatment is not needed for this condition. In some cases, however, treatment may be needed, including: ?Taking over-the-counter pain medicines to help relieve pain or discomfort. ?Limiting or avoiding caffeine. Foods and beverages that contain caffeine include chocolate, soda, coffee, and tea. ?Reducing sugar and fat in your  diet. ?Treatment may also include: ?A procedure to remove fluid from a cyst that is causing pain (fine needle aspiration). ?Surgery to remove a cyst that is large or tender or does not go away. ?Medicines that may lower the amount of female hormones. ?Follow these instructions at home: ?Self care ?Check your breasts after every menstrual period. If you do not have menstrual periods, check your breasts on the first day of every month. Feel for changes in your breasts, such as: ?More tenderness. ?A new growth. ?A change in size. ?A change in an existing lump. ?General instructions ?Take over-the-counter and prescription medicines only as told by your health care provider. ?Wear a well-fitting support or sports bra, especially when exercising. ?If told by your health care provider, decrease or avoid caffeine, fat, and sugar in your diet. ?Keep all follow-up visits as told by your health care provider. This is important. ?Contact a health care provider if: ?You have fluid leaking from your nipple, especially if it is bloody. ?You have new lumps or bumps in your breast. ?Your breast becomes enlarged, red, and painful. ?You have areas of your breast that pucker inward. ?Your nipple appears flat or indented. ?Get help right away if: ?You have redness of your breast and the redness is spreading. ?Summary ?Fibrocystic breast changes are changes in breast tissue that can cause breasts to become swollen, lumpy, or painful. ?This condition may be related to the female hormones estrogen and progesterone. ?With this condition, it is important to examine your breasts after every menstrual period. If you do not have menstrual periods, check your breasts on the first day of every month. ?This information is not intended to replace advice given to you by your  health care provider. Make sure you discuss any questions you have with your health care provider. ?Document Revised: 01/10/2019 Document Reviewed: 01/10/2019 ?Elsevier Patient  Education ? 2022 [employer] ? ?

## 2021-04-11 NOTE — Progress Notes (Signed)
?Jeri Cos Llittleton,acting as a Neurosurgeon for [name], MD.,have documented all relevant documentation on the behalf of [name], MD,as directed by  [name], MD while in the presence of [name], MD.  ?This visit occurred during the SARS-CoV-2 public health emergency.  Safety protocols were in place, including screening questions prior to the visit, additional usage of staff PPE, and extensive cleaning of exam room while observing appropriate contact time as indicated for disinfecting solutions. ? ?Subjective:  ?  ? Patient ID: Natalie Guerrero , female    DOB: 10-30-1965 , 56 y.o.   MRN: [id number] ? ? ?Chief Complaint  ?Patient presents with  ? Breast Mass  ? ? ?HPI ? ?Patient presents today for knots in her breasts. She noticed the lumps in mid Feb, about tow weeks ago. . The right breast is more painful. She reports that she noticed the lumps after her grandchild hit the area of concern. Denies nipple discharge.  ?  ? ?Past Medical History:  ?Diagnosis Date  ? Anxiety   ? Arthritis   ? DDD (degenerative disc disease)   ? Depression   ? Hypothyroidism 12/08/2012  ? Iron deficiency anemia, unspecified 11/10/2012  ? Iron malabsorption 01/20/2017  ? Obesity   ? Thyroid disease hypothroidism  ?  ? ?Family History  ?Problem Relation Age of Onset  ? Heart failure Mother   ? Prostate cancer Father   ? Colon cancer Neg Hx   ? Diabetes Neg Hx   ? Kidney disease Neg Hx   ? Liver disease Neg Hx   ? Neuropathy Neg Hx   ? ? ? ?Current Outpatient Medications:  ?  Biotin 10 MG CAPS, Take 30 mg by mouth daily. , Disp: , Rfl:  ?  brimonidine (ALPHAGAN) 0.2 % ophthalmic solution, Place 1 drop into the right eye 2 (two) times daily., Disp: , Rfl:  ?  buPROPion (WELLBUTRIN XL) 150 MG 24 hr tablet, TAKE 1 TABLET BY MOUTH EVERY DAY, Disp: 90 tablet, Rfl: 1 ?  CALCIUM+D3 600-800 MG-UNIT TABS, Take by mouth. 2 capsules daily, Disp: , Rfl:  ?  cetirizine (ZYRTEC) 10 MG tablet, Take 10 mg by mouth  daily., Disp: , Rfl:  ?  Cholecalciferol (VITAMIN D3) 2000 units TABS, Take 10,000 Units by mouth daily., Disp: , Rfl:  ?  conjugated estrogens (PREMARIN) vaginal cream, 0.5 gram, Disp: , Rfl:  ?  cycloSPORINE (RESTASIS) 0.05 % ophthalmic emulsion, Place 1 drop into both eyes 2 (two) times daily., Disp: , Rfl:  ?  Dextromethorphan-guaiFENesin (MUCINEX DM MAXIMUM STRENGTH) 60-1200 MG TB12, Take 1 tablet by mouth 2 (two) times daily., Disp: , Rfl:  ?  diphenhydrAMINE (BENADRYL) 25 mg capsule, Take 50 mg by mouth 2 (two) times daily., Disp: , Rfl:  ?  docusate sodium (COLACE) 100 MG capsule, Take 200 mg by mouth 2 (two) times daily. , Disp: , Rfl:  ?  FIBER PO, Take 4 capsules by mouth daily. prn, Disp: , Rfl:  ?  ibuprofen (ADVIL) 800 MG tablet, Take 800 mg by mouth 3 (three) times daily as needed., Disp: , Rfl:  ?  imipramine (TOFRANIL) 25 MG tablet, Take 25 mg by mouth 3 (three) times daily. 3 tabs 2 times per day, Disp: , Rfl: 4 ?  levothyroxine (SYNTHROID) 100 MCG tablet, TAKE ONE TABLET M-F AND TAKE 1/2 PILL ON SATURDAY/SUNDAY. (Patient taking differently: daily. take one tablet M-F), Disp: 90 tablet, Rfl: 0 ?  Liniments (SALONPAS PAIN  RELIEF PATCH EX), Apply 1 patch topically daily., Disp: , Rfl:  ?  MAGNESIUM PO, Take 5 tablets by mouth daily. , Disp: , Rfl:  ?  MENTHOL-METHYL SALICYLATE EX, Apply topically. Apply one patch topically daily, Disp: , Rfl:  ?  naloxone (NARCAN) nasal spray 4 mg/0.1 mL, See admin instructions., Disp: , Rfl:  ?  omeprazole (PRILOSEC) 40 MG capsule, TAKE 1 CAPSULE BY MOUTH EVERY DAY, Disp: 90 capsule, Rfl: 1 ?  OVER THE COUNTER MEDICATION, Apply 1 application topically 2 (two) times daily. [name] and [place] Arthritis Cream, Disp: , Rfl:  ?  Oxycodone HCl 20 MG TABS, SMARTSIG:0.5-1 Tablet(s) By Mouth 4-6 Times Daily, Disp: , Rfl:  ?  predniSONE (DELTASONE) 10 MG tablet, 12 day tapering dose, Disp: 48 tablet, Rfl: 0 ?  tiZANidine (ZANAFLEX) 4 MG tablet, every 4 (four) hours as needed.,  Disp: , Rfl:  ?  topiramate (TOPAMAX) 100 MG tablet, Take 100 mg by mouth 4 (four) times daily. , Disp: , Rfl: 5 ?  Turmeric Curcumin 500 MG CAPS, Take 1,000 mg by mouth 2 (two) times daily., Disp: , Rfl:  ?  valACYclovir (VALTREX) 500 MG tablet, as needed., Disp: , Rfl:  ?  ZINC-VITAMIN C PO, Take 6 tablets by mouth daily., Disp: , Rfl:  ?  Cyanocobalamin (VITAMIN B-12) 5000 MCG SUBL, Take 10,000 mcg by mouth daily. (Patient not taking: Reported on 04/11/2021), Disp: , Rfl:  ?  Prasterone, DHEA, (DHEA 50) 50 MG CAPS, See admin instructions. (Patient not taking: Reported on 04/11/2021), Disp: , Rfl:  ? ?Current Facility-Administered Medications:  ?  triamcinolone acetonide (KENALOG) 10 MG/ML injection 20 mg, 20 mg, Other, Once, Regal, Kirstie Peri, DPM  ? ?Allergies  ?Allergen Reactions  ? Codeine Itching  ?  ? ?Review of Systems  ?Constitutional: Negative.   ?Respiratory: Negative.    ?Cardiovascular: Negative.   ?Gastrointestinal: Negative.   ?Neurological: Negative.   ?Psychiatric/Behavioral: Negative.     ? ?Today's Vitals  ? 04/11/21 1607  ?BP: 120/80  ?Pulse: 89  ?Temp: 98.6 ?F (37 ?C)  ?Weight: 259 lb 12.8 oz (117.8 kg)  ?Height: 5' 4.4" (1.636 m)  ?PainSc: 5   ?PainLoc: Back  ? ?Body mass index is 44.04 kg/m?.  ?Wt Readings from Last 3 Encounters:  ?04/11/21 259 lb 12.8 oz (117.8 kg)  ?03/28/21 267 lb 3.2 oz (121.2 kg)  ?01/08/21 265 lb 6.4 oz (120.4 kg)  ?  ? ?Objective:  ?Physical Exam ?Vitals and nursing note reviewed.  ?Constitutional:   ?   Appearance: Normal appearance. She is obese.  ?HENT:  ?   Head: Normocephalic and atraumatic.  ?   Nose:  ?   Comments: Masked  ?   Mouth/Throat:  ?   Comments: Masked  ?Cardiovascular:  ?   Rate and Rhythm: Normal rate and regular rhythm.  ?   Heart sounds: Normal heart sounds.  ?Pulmonary:  ?   Effort: Pulmonary effort is normal.  ?   Breath sounds: Normal breath sounds.  ?Chest:  ?Breasts: ?   Tanner Score is 5.  ?   Right: Tenderness present. No skin change.  ?   Left:  Tenderness present. No skin change.  ? ? ?   Comments: Areas of tenderness (X) ?Mass appreciated beneath areola ?Lump noted on r breast, not tender ?Skin: ?   General: Skin is warm.  ?Neurological:  ?   General: No focal deficit present.  ?   Mental Status: She is alert.  ?Psychiatric:     ?  Mood and Affect: Mood normal.     ?   Behavior: Behavior normal.  ? ?   ?Assessment And Plan:  ?   ?1. Mass of right breast, unspecified quadrant ?Comments: I will refer her for diagnostic Mammo and u/s. Advised to cut back on her caffeine intake.  ?- MM Digital Diagnostic Bilat; Future ? ?2. Subareolar mass of left breast ?Comments: Please see #1.  ?- MM Digital Diagnostic Bilat; Future ? ?3. Class 3 severe obesity due to excess calories with serious comorbidity and body mass index (BMI) of 40.0 to 44.9 in adult [facility]) ?Comments: BMI 44. She is encouraged to initially strive to lose ten percent of her body weight.  ? ?4. Immunization due ?Comments: She was given flu vaccine.  ?- Flu Vaccine QUAD 6+ mos PF IM (Fluarix Quad PF) ?  ?Patient was given opportunity to ask questions. Patient verbalized understanding of the plan and was able to repeat key elements of the plan. All questions were answered to their satisfaction.  ? ?I, [name], MD, have reviewed all documentation for this visit. The documentation on 04/14/21 for the exam, diagnosis, procedures, and orders are all accurate and complete.  ? ?IF YOU HAVE BEEN REFERRED TO A SPECIALIST, IT MAY TAKE 1-2 WEEKS TO SCHEDULE/PROCESS THE REFERRAL. IF YOU HAVE NOT HEARD FROM US/SPECIALIST IN TWO WEEKS, PLEASE GIVE [place] A CALL AT [phone] X [phone].  ? ?THE PATIENT IS ENCOURAGED TO PRACTICE SOCIAL DISTANCING DUE TO THE COVID-19 PANDEMIC.   ?

## 2021-04-14 DIAGNOSIS — N6310 Unspecified lump in the right breast, unspecified quadrant: Secondary | ICD-10-CM | POA: Insufficient documentation

## 2021-04-16 ENCOUNTER — Other Ambulatory Visit: Payer: Self-pay | Admitting: Internal Medicine

## 2021-04-16 ENCOUNTER — Encounter: Payer: Medicare HMO | Admitting: Diagnostic Neuroimaging

## 2021-04-16 DIAGNOSIS — N6310 Unspecified lump in the right breast, unspecified quadrant: Secondary | ICD-10-CM

## 2021-04-16 DIAGNOSIS — N6342 Unspecified lump in left breast, subareolar: Secondary | ICD-10-CM

## 2021-04-22 ENCOUNTER — Ambulatory Visit: Payer: Medicare HMO | Admitting: Podiatry

## 2021-04-22 ENCOUNTER — Encounter: Payer: Self-pay | Admitting: Podiatry

## 2021-04-22 ENCOUNTER — Other Ambulatory Visit: Payer: Self-pay

## 2021-04-22 DIAGNOSIS — M722 Plantar fascial fibromatosis: Secondary | ICD-10-CM

## 2021-04-22 DIAGNOSIS — M7750 Other enthesopathy of unspecified foot: Secondary | ICD-10-CM

## 2021-04-22 NOTE — Progress Notes (Signed)
Subjective:  ? ?Patient ID: Natalie Guerrero, female   DOB: 56 y.o.   MRN: [id number]  ? ?HPI ?Patient presents stating her pain is improved with patient still getting some discomfort and still having some issues at times ? ? ?ROS ? ? ?   ?Objective:  ?Physical Exam  ?Neurovascular status intact with discomfort in the plantar heel which is improved and into the lateral ankle with obesity is complicating factor with this condition ? ?   ?Assessment:  ?Chronic inflammation with obesity being complicating factor with patient trying to lose weight at the current time ? ?   ?Plan:  ?H&P reviewed condition recommended continuation of anti-inflammatories activity support therapy and stretching exercises.  Encourage weight loss patient will continue to pursue this course and will be seen back as indicated and did spend a great deal of time educating her on long-term prognosis ?   ? ? ?

## 2021-04-22 NOTE — Patient Instructions (Signed)
Plantar Fasciitis (Heel Spur Syndrome) °with Rehab °The plantar fascia is a fibrous, ligament-like, soft-tissue structure that spans the bottom of the foot. Plantar fasciitis is a condition that causes pain in the foot due to inflammation of the tissue. °SYMPTOMS  °Pain and tenderness on the underneath side of the foot. °Pain that worsens with standing or walking. °CAUSES  °Plantar fasciitis is caused by irritation and injury to the plantar fascia on the underneath side of the foot. Common mechanisms of injury include: °Direct trauma to bottom of the foot. °Damage to a small nerve that runs under the foot where the main fascia attaches to the heel bone. °Stress placed on the plantar fascia due to bone spurs. °RISK INCREASES WITH:  °Activities that place stress on the plantar fascia (running, jumping, pivoting, or cutting). °Poor strength and flexibility. °Improperly fitted shoes. °Tight calf muscles. °Flat feet. °Failure to warm-up properly before activity. °Obesity. °PREVENTION °Warm up and stretch properly before activity. °Allow for adequate recovery between workouts. °Maintain physical fitness: °Strength, flexibility, and endurance. °Cardiovascular fitness. °Maintain a health body weight. °Avoid stress on the plantar fascia. °Wear properly fitted shoes, including arch supports for individuals who have flat feet. ° °PROGNOSIS  °If treated properly, then the symptoms of plantar fasciitis usually resolve without surgery. However, occasionally surgery is necessary. ° °RELATED COMPLICATIONS  °Recurrent symptoms that may result in a chronic condition. °Problems of the lower back that are caused by compensating for the injury, such as limping. °Pain or weakness of the foot during push-off following surgery. °Chronic inflammation, scarring, and partial or complete fascia tear, occurring more often from repeated injections. ° °TREATMENT  °Treatment initially involves the use of ice and medication to help reduce pain and  inflammation. The use of strengthening and stretching exercises may help reduce pain with activity, especially stretches of the Achilles tendon. These exercises may be performed at home or with a therapist. Your caregiver may recommend that you use heel cups of arch supports to help reduce stress on the plantar fascia. Occasionally, corticosteroid injections are given to reduce inflammation. If symptoms persist for greater than 6 months despite non-surgical (conservative), then surgery may be recommended.  ° °MEDICATION  °If pain medication is necessary, then nonsteroidal anti-inflammatory medications, such as aspirin and ibuprofen, or other minor pain relievers, such as acetaminophen, are often recommended. °Do not take pain medication within 7 days before surgery. °Prescription pain relievers may be given if deemed necessary by your caregiver. Use only as directed and only as much as you need. °Corticosteroid injections may be given by your caregiver. These injections should be reserved for the most serious cases, because they may only be given a certain number of times. ° °HEAT AND COLD °Cold treatment (icing) relieves pain and reduces inflammation. Cold treatment should be applied for 10 to 15 minutes every 2 to 3 hours for inflammation and pain and immediately after any activity that aggravates your symptoms. Use ice packs or massage the area with a piece of ice (ice massage). °Heat treatment may be used prior to performing the stretching and strengthening activities prescribed by your caregiver, physical therapist, or athletic trainer. Use a heat pack or soak the injury in warm water. ° °SEEK IMMEDIATE MEDICAL CARE IF: °Treatment seems to offer no benefit, or the condition worsens. °Any medications produce adverse side effects. ° °EXERCISES- °RANGE OF MOTION (ROM) AND STRETCHING EXERCISES - Plantar Fasciitis (Heel Spur Syndrome) °These exercises may help you when beginning to rehabilitate your injury. Your    symptoms may resolve with or without further involvement from your physician, physical therapist or athletic trainer. While completing these exercises, remember:  °Restoring tissue flexibility helps normal motion to return to the joints. This allows healthier, less painful movement and activity. °An effective stretch should be held for at least 30 seconds. °A stretch should never be painful. You should only feel a gentle lengthening or release in the stretched tissue. ° °RANGE OF MOTION - Toe Extension, Flexion °Sit with your right / left leg crossed over your opposite knee. °Grasp your toes and gently pull them back toward the top of your foot. You should feel a stretch on the bottom of your toes and/or foot. °Hold this stretch for 10 seconds. °Now, gently pull your toes toward the bottom of your foot. You should feel a stretch on the top of your toes and or foot. °Hold this stretch for 10 seconds. °Repeat  times. Complete this stretch 3 times per day.  ° °RANGE OF MOTION - Ankle Dorsiflexion, Active Assisted °Remove shoes and sit on a chair that is preferably not on a carpeted surface. °Place right / left foot under knee. Extend your opposite leg for support. °Keeping your heel down, slide your right / left foot back toward the chair until you feel a stretch at your ankle or calf. If you do not feel a stretch, slide your bottom forward to the edge of the chair, while still keeping your heel down. °Hold this stretch for 10 seconds. °Repeat 3 times. Complete this stretch 2 times per day.  ° °STRETCH  Gastroc, Standing °Place hands on wall. °Extend right / left leg, keeping the front knee somewhat bent. °Slightly point your toes inward on your back foot. °Keeping your right / left heel on the floor and your knee straight, shift your weight toward the wall, not allowing your back to arch. °You should feel a gentle stretch in the right / left calf. Hold this position for 10 seconds. °Repeat 3 times. Complete this  stretch 2 times per day. ° °STRETCH  Soleus, Standing °Place hands on wall. °Extend right / left leg, keeping the other knee somewhat bent. °Slightly point your toes inward on your back foot. °Keep your right / left heel on the floor, bend your back knee, and slightly shift your weight over the back leg so that you feel a gentle stretch deep in your back calf. °Hold this position for 10 seconds. °Repeat 3 times. Complete this stretch 2 times per day. ° °STRETCH  Gastrocsoleus, Standing  °Note: This exercise can place a lot of stress on your foot and ankle. Please complete this exercise only if specifically instructed by your caregiver.  °Place the ball of your right / left foot on a step, keeping your other foot firmly on the same step. °Hold on to the wall or a rail for balance. °Slowly lift your other foot, allowing your body weight to press your heel down over the edge of the step. °You should feel a stretch in your right / left calf. °Hold this position for 10 seconds. °Repeat this exercise with a slight bend in your right / left knee. °Repeat 3 times. Complete this stretch 2 times per day.  ° °STRENGTHENING EXERCISES - Plantar Fasciitis (Heel Spur Syndrome)  °These exercises may help you when beginning to rehabilitate your injury. They may resolve your symptoms with or without further involvement from your physician, physical therapist or athletic trainer. While completing these exercises, remember:  °Muscles can   gain both the endurance and the strength needed for everyday activities through controlled exercises. °Complete these exercises as instructed by your physician, physical therapist or athletic trainer. Progress the resistance and repetitions only as guided. ° °STRENGTH - Towel Curls °Sit in a chair positioned on a non-carpeted surface. °Place your foot on a towel, keeping your heel on the floor. °Pull the towel toward your heel by only curling your toes. Keep your heel on the floor. °Repeat 3 times.  Complete this exercise 2 times per day. ° °STRENGTH - Ankle Inversion °Secure one end of a rubber exercise band/tubing to a fixed object (table, pole). Loop the other end around your foot just before your toes. °Place your fists between your knees. This will focus your strengthening at your ankle. °Slowly, pull your big toe up and in, making sure the band/tubing is positioned to resist the entire motion. °Hold this position for 10 seconds. °Have your muscles resist the band/tubing as it slowly pulls your foot back to the starting position. °Repeat 3 times. Complete this exercises 2 times per day.  °Document Released: 01/27/2005 Document Revised: 04/21/2011 Document Reviewed: 05/11/2008 °[employer]® Patient Information ©2014 [employer].  °

## 2021-04-23 ENCOUNTER — Ambulatory Visit: Payer: Medicare HMO | Admitting: Internal Medicine

## 2021-04-27 DIAGNOSIS — R69 Illness, unspecified: Secondary | ICD-10-CM | POA: Diagnosis not present

## 2021-04-29 ENCOUNTER — Encounter: Payer: Medicare HMO | Admitting: Diagnostic Neuroimaging

## 2021-04-29 DIAGNOSIS — G8929 Other chronic pain: Secondary | ICD-10-CM | POA: Diagnosis not present

## 2021-04-29 DIAGNOSIS — G894 Chronic pain syndrome: Secondary | ICD-10-CM | POA: Diagnosis not present

## 2021-04-29 DIAGNOSIS — Z8249 Family history of ischemic heart disease and other diseases of the circulatory system: Secondary | ICD-10-CM | POA: Diagnosis not present

## 2021-04-29 DIAGNOSIS — M48062 Spinal stenosis, lumbar region with neurogenic claudication: Secondary | ICD-10-CM | POA: Diagnosis not present

## 2021-04-29 DIAGNOSIS — R609 Edema, unspecified: Secondary | ICD-10-CM | POA: Diagnosis not present

## 2021-04-29 DIAGNOSIS — M47897 Other spondylosis, lumbosacral region: Secondary | ICD-10-CM | POA: Diagnosis not present

## 2021-04-29 DIAGNOSIS — M25559 Pain in unspecified hip: Secondary | ICD-10-CM | POA: Diagnosis not present

## 2021-04-29 DIAGNOSIS — E6609 Other obesity due to excess calories: Secondary | ICD-10-CM | POA: Diagnosis not present

## 2021-04-29 DIAGNOSIS — M545 Low back pain, unspecified: Secondary | ICD-10-CM | POA: Diagnosis not present

## 2021-04-29 DIAGNOSIS — M25519 Pain in unspecified shoulder: Secondary | ICD-10-CM | POA: Diagnosis not present

## 2021-04-29 DIAGNOSIS — M792 Neuralgia and neuritis, unspecified: Secondary | ICD-10-CM | POA: Diagnosis not present

## 2021-04-29 DIAGNOSIS — I119 Hypertensive heart disease without heart failure: Secondary | ICD-10-CM | POA: Diagnosis not present

## 2021-05-01 ENCOUNTER — Ambulatory Visit (INDEPENDENT_AMBULATORY_CARE_PROVIDER_SITE_OTHER): Payer: Medicare HMO

## 2021-05-01 ENCOUNTER — Ambulatory Visit: Payer: Medicare Other | Admitting: Internal Medicine

## 2021-05-01 VITALS — Ht 66.0 in | Wt 257.0 lb

## 2021-05-01 DIAGNOSIS — Z0000 Encounter for general adult medical examination without abnormal findings: Secondary | ICD-10-CM | POA: Diagnosis not present

## 2021-05-01 NOTE — Progress Notes (Signed)
?I connected with Natalie Guerrero today by telephone and verified that I am speaking with the correct person using two identifiers. ?Location patient: home ?Location provider: work ?Persons participating in the virtual visit: Aram Beecham LPN. ?  ?I discussed the limitations, risks, security and privacy concerns of performing an evaluation and management service by telephone and the availability of in person appointments. I also discussed with the patient that there may be a patient responsible charge related to this service. The patient expressed understanding and verbally consented to this telephonic visit.  ?  ?Interactive audio and video telecommunications were attempted between this provider and patient, however failed, due to patient having technical difficulties OR patient did not have access to video capability.  We continued and completed visit with audio only. ? ?  ? ?Vital signs may be patient reported or missing. ? ?Subjective:  ? Natalie Guerrero is a 56 y.o. female who presents for Medicare Annual (Subsequent) preventive examination. ? ?Review of Systems    ? ?Cardiac Risk Factors include: obesity (BMI >30kg/m2) ? ?   ?Objective:  ?  ?Today's Vitals  ? 05/01/21 0913 05/01/21 0914  ?Weight: 257 lb (116.6 kg)   ?Height: 5\' 6"  (1.676 m)   ?PainSc:  5   ? ?Body mass index is 41.48 kg/m?. ? ? ?  05/01/2021  ?  9:19 AM 04/12/2020  ?  2:11 PM 12/28/2019  ? 12:35 PM 11/02/2018  ? 12:46 PM 09/09/2018  ? 11:59 AM 07/26/2018  ? 11:25 AM 12/22/2017  ?  8:58 AM  ?Advanced Directives  ?Does Patient Have a Medical Advance Directive? No No No No No No No  ?Would patient like information on creating a medical advance directive?   No - Patient declined No - Patient declined  No - Patient declined No - Patient declined  ? ? ?Current Medications (verified) ?Outpatient Encounter Medications as of 05/01/2021  ?Medication Sig  ? Biotin 10 MG CAPS Take 30 mg by mouth daily.   ? brimonidine (ALPHAGAN)  0.2 % ophthalmic solution Place 1 drop into the right eye 2 (two) times daily.  ? buPROPion (WELLBUTRIN XL) 150 MG 24 hr tablet TAKE 1 TABLET BY MOUTH EVERY DAY  ? CALCIUM+D3 600-800 MG-UNIT TABS Take by mouth. 2 capsules daily  ? cetirizine (ZYRTEC) 10 MG tablet Take 10 mg by mouth daily.  ? Cholecalciferol (VITAMIN D3) 2000 units TABS Take 10,000 Units by mouth daily.  ? cycloSPORINE (RESTASIS) 0.05 % ophthalmic emulsion Place 1 drop into both eyes 2 (two) times daily.  ? Dextromethorphan-guaiFENesin (MUCINEX DM MAXIMUM STRENGTH) 60-1200 MG TB12 Take 1 tablet by mouth 2 (two) times daily.  ? diphenhydrAMINE (BENADRYL) 25 mg capsule Take 50 mg by mouth 2 (two) times daily.  ? docusate sodium (COLACE) 100 MG capsule Take 200 mg by mouth 2 (two) times daily.   ? FIBER PO Take 4 capsules by mouth daily. prn  ? ibuprofen (ADVIL) 800 MG tablet Take 800 mg by mouth 3 (three) times daily as needed.  ? imipramine (TOFRANIL) 25 MG tablet Take 25 mg by mouth 3 (three) times daily. 3 tabs 2 times per day  ? levothyroxine (SYNTHROID) 100 MCG tablet TAKE ONE TABLET M-F AND TAKE 1/2 PILL ON SATURDAY/SUNDAY. (Patient taking differently: daily. take one tablet M-F)  ? Liniments (SALONPAS PAIN RELIEF PATCH EX) Apply 1 patch topically daily.  ? MAGNESIUM PO Take 5 tablets by mouth daily.   ? MENTHOL-METHYL SALICYLATE EX Apply topically. Apply one patch  topically daily  ? naloxone (NARCAN) nasal spray 4 mg/0.1 mL See admin instructions.  ? omeprazole (PRILOSEC) 40 MG capsule TAKE 1 CAPSULE BY MOUTH EVERY DAY  ? OVER THE COUNTER MEDICATION Apply 1 application topically 2 (two) times daily. [name] and [place] Arthritis Cream  ? Oxycodone HCl 20 MG TABS SMARTSIG:0.5-1 Tablet(s) By Mouth 4-6 Times Daily  ? tiZANidine (ZANAFLEX) 4 MG tablet every 4 (four) hours as needed.  ? topiramate (TOPAMAX) 100 MG tablet Take 100 mg by mouth 4 (four) times daily.   ? Turmeric Curcumin 500 MG CAPS Take 1,000 mg by mouth 2 (two) times daily.  ?  valACYclovir (VALTREX) 500 MG tablet as needed.  ? ZINC-VITAMIN C PO Take 6 tablets by mouth daily.  ? conjugated estrogens (PREMARIN) vaginal cream 0.5 gram  ? Cyanocobalamin (VITAMIN B-12) 5000 MCG SUBL Take 10,000 mcg by mouth daily. (Patient not taking: Reported on 04/11/2021)  ? Prasterone, DHEA, (DHEA 50) 50 MG CAPS See admin instructions. (Patient not taking: Reported on 04/11/2021)  ? predniSONE (DELTASONE) 10 MG tablet 12 day tapering dose (Patient not taking: Reported on 05/01/2021)  ? ?Facility-Administered Encounter Medications as of 05/01/2021  ?Medication  ? triamcinolone acetonide (KENALOG) 10 MG/ML injection 20 mg  ? ? ?Allergies (verified) ?Codeine  ? ?History: ?Past Medical History:  ?Diagnosis Date  ? Anxiety   ? Arthritis   ? DDD (degenerative disc disease)   ? Depression   ? Hypothyroidism 12/08/2012  ? Iron deficiency anemia, unspecified 11/10/2012  ? Iron malabsorption 01/20/2017  ? Obesity   ? Thyroid disease hypothroidism  ? ?Past Surgical History:  ?Procedure Laterality Date  ? ABDOMINAL HYSTERECTOMY  1999  ? BACK SURGERY    ? CESAREAN SECTION  1991  ? CHOLECYSTECTOMY N/A 12/22/2017  ? Procedure: LAPAROSCOPIC CHOLECYSTECTOMY;  Surgeon: Axel Filler, MD;  Location: [facility];  Service: General;  Laterality: N/A;  ? GASTRIC BYPASS  2007  ? KNEE SURGERY Left 2000  ? ?Family History  ?Problem Relation Age of Onset  ? Heart failure Mother   ? Prostate cancer Father   ? Colon cancer Neg Hx   ? Diabetes Neg Hx   ? Kidney disease Neg Hx   ? Liver disease Neg Hx   ? Neuropathy Neg Hx   ? ?Social History  ? ?Socioeconomic History  ? Marital status: Married  ?  Spouse name: Not on file  ? Number of children: Not on file  ? Years of education: Not on file  ? Highest education level: Not on file  ?Occupational History  ? Occupation: disability  ?Tobacco Use  ? Smoking status: Never  ? Smokeless tobacco: Never  ?Vaping Use  ? Vaping Use: Never used  ?Substance and Sexual Activity  ? Alcohol use: Not Currently   ? Drug use: Yes  ?  Types: Oxycodone  ? Sexual activity: Yes  ?Other Topics Concern  ? Not on file  ?Social History Narrative  ? Not on file  ? ?Social Determinants of Health  ? ?Financial Resource Strain: Low Risk   ? Difficulty of Paying Living Expenses: Not hard at all  ?Food Insecurity: No Food Insecurity  ? Worried About Programme researcher, broadcasting/film/video in the Last Year: Never true  ? Ran Out of Food in the Last Year: Never true  ?Transportation Needs: No Transportation Needs  ? Lack of Transportation (Medical): No  ? Lack of Transportation (Non-Medical): No  ?Physical Activity: Inactive  ? Days of Exercise per Week: 0 days  ?  Minutes of Exercise per Session: 0 min  ?Stress: No Stress Concern Present  ? Feeling of Stress : Only a little  ?Social Connections: Not on file  ? ? ?Tobacco Counseling ?Counseling given: Not Answered ? ? ?Clinical Intake: ? ?Pre-visit preparation completed: Yes ? ?Pain : 0-10 ?Pain Score: 5  ?Pain Type: Chronic pain ?Pain Location: Back ?Pain Orientation: Lower ?Pain Descriptors / Indicators: Aching, Sharp ?Pain Onset: More than a month ago ?Pain Frequency: Constant ? ?  ? ?Nutritional Status: BMI > 30  Obese ?Nutritional Risks: None ?Diabetes: No ? ?How often do you need to have someone help you when you read instructions, pamphlets, or other written materials from your doctor or pharmacy?: 1 - Never ? ?Diabetic? no ? ?Interpreter Needed?: No ? ?Information entered by :: NAllen LPN ? ? ?Activities of Daily Living ? ?  05/01/2021  ?  9:21 AM  ?In your present state of health, do you have any difficulty performing the following activities:  ?Hearing? 0  ?Vision? 0  ?Difficulty concentrating or making decisions? 0  ?Walking or climbing stairs? 0  ?Comment only with pain  ?Dressing or bathing? 0  ?Doing errands, shopping? 0  ?Preparing Food and eating ? N  ?Using the Toilet? N  ?In the past six months, have you accidently leaked urine? N  ?Do you have problems with loss of bowel control? N  ?Managing  your Medications? N  ?Managing your Finances? N  ?Housekeeping or managing your Housekeeping? N  ? ? ?Patient Care Team: ?Dorothyann PengSanders, Robyn, MD as PCP - General (Internal Medicine) ?Deberah Castlealton-Bethea, Shawn M, MD as

## 2021-05-01 NOTE — Patient Instructions (Signed)
Ms. Gallentine , ?Thank you for taking time to come for your Medicare Wellness Visit. I appreciate your ongoing commitment to your health goals. Please review the following plan we discussed and let me know if I can assist you in the future.  ? ?Screening recommendations/referrals: ?Colonoscopy: completed 08/23/2015, due 08/22/2025 ?Mammogram: scheduled for 05/07/2021 ?Bone Density: n/a ?Recommended yearly ophthalmology/optometry visit for glaucoma screening and checkup ?Recommended yearly dental visit for hygiene and checkup ? ?Vaccinations: ?Influenza vaccine: completed 04/11/2021, due next flu season ?Pneumococcal vaccine: n/a ?Tdap vaccine: completed 12/01/2018, due 11/30/2028 ?Shingles vaccine: discussed  ?Covid-19:  02/10/2020, 05/30/2019, 05/05/2019 ? ?Advanced directives: Advance directive discussed with you today.  ? ?Conditions/risks identified: none ? ?Next appointment: Follow up in one year for your annual wellness visit.  ? ?Preventive Care 40-64 Years, Female ?Preventive care refers to lifestyle choices and visits with your health care provider that can promote health and wellness. ?What does preventive care include? ?A yearly physical exam. This is also called an annual well check. ?Dental exams once or twice a year. ?Routine eye exams. Ask your health care provider how often you should have your eyes checked. ?Personal lifestyle choices, including: ?Daily care of your teeth and gums. ?Regular physical activity. ?Eating a healthy diet. ?Avoiding tobacco and drug use. ?Limiting alcohol use. ?Practicing safe sex. ?Taking low-dose aspirin daily starting at age 32. ?Taking vitamin and mineral supplements as recommended by your health care provider. ?What happens during an annual well check? ?The services and screenings done by your health care provider during your annual well check will depend on your age, overall health, lifestyle risk factors, and family history of disease. ?Counseling  ?Your health care provider  may ask you questions about your: ?Alcohol use. ?Tobacco use. ?Drug use. ?Emotional well-being. ?Home and relationship well-being. ?Sexual activity. ?Eating habits. ?Work and work Statistician. ?Method of birth control. ?Menstrual cycle. ?Pregnancy history. ?Screening  ?You may have the following tests or measurements: ?Height, weight, and BMI. ?Blood pressure. ?Lipid and cholesterol levels. These may be checked every 5 years, or more frequently if you are over 76 years old. ?Skin check. ?Lung cancer screening. You may have this screening every year starting at age 19 if you have a 30-pack-year history of smoking and currently smoke or have quit within the past 15 years. ?Fecal occult blood test (FOBT) of the stool. You may have this test every year starting at age 67. ?Flexible sigmoidoscopy or colonoscopy. You may have a sigmoidoscopy every 5 years or a colonoscopy every 10 years starting at age 67. ?Hepatitis C blood test. ?Hepatitis B blood test. ?Sexually transmitted disease (STD) testing. ?Diabetes screening. This is done by checking your blood sugar (glucose) after you have not eaten for a while (fasting). You may have this done every 1-3 years. ?Mammogram. This may be done every 1-2 years. Talk to your health care provider about when you should start having regular mammograms. This may depend on whether you have a family history of breast cancer. ?BRCA-related cancer screening. This may be done if you have a family history of breast, ovarian, tubal, or peritoneal cancers. ?Pelvic exam and Pap test. This may be done every 3 years starting at age 38. Starting at age 44, this may be done every 5 years if you have a Pap test in combination with an HPV test. ?Bone density scan. This is done to screen for osteoporosis. You may have this scan if you are at high risk for osteoporosis. ?Discuss your test results, treatment  options, and if necessary, the need for more tests with your health care provider. ?Vaccines   ?Your health care provider may recommend certain vaccines, such as: ?Influenza vaccine. This is recommended every year. ?Tetanus, diphtheria, and acellular pertussis (Tdap, Td) vaccine. You may need a Td booster every 10 years. ?Zoster vaccine. You may need this after age 5. ?Pneumococcal 13-valent conjugate (PCV13) vaccine. You may need this if you have certain conditions and were not previously vaccinated. ?Pneumococcal polysaccharide (PPSV23) vaccine. You may need one or two doses if you smoke cigarettes or if you have certain conditions. ?Talk to your health care provider about which screenings and vaccines you need and how often you need them. ?This information is not intended to replace advice given to you by your health care provider. Make sure you discuss any questions you have with your health care provider. ?Document Released: 02/23/2015 Document Revised: 10/17/2015 Document Reviewed: 11/28/2014 ?Elsevier Interactive Patient Education ? 2017 [employer] ? ? ? ?Fall Prevention in the Home ?Falls can cause injuries. They can happen to people of all ages. There are many things you can do to make your home safe and to help prevent falls. ?What can I do on the outside of my home? ?Regularly fix the edges of walkways and driveways and fix any cracks. ?Remove anything that might make you trip as you walk through a door, such as a raised step or threshold. ?Trim any bushes or trees on the path to your home. ?Use bright outdoor lighting. ?Clear any walking paths of anything that might make someone trip, such as rocks or tools. ?Regularly check to see if handrails are loose or broken. Make sure that both sides of any steps have handrails. ?Any raised decks and porches should have guardrails on the edges. ?Have any leaves, snow, or ice cleared regularly. ?Use sand or salt on walking paths during winter. ?Clean up any spills in your garage right away. This includes oil or grease spills. ?What can I do in the  bathroom? ?Use night lights. ?Install grab bars by the toilet and in the tub and shower. Do not use towel bars as grab bars. ?Use non-skid mats or decals in the tub or shower. ?If you need to sit down in the shower, use a plastic, non-slip stool. ?Keep the floor dry. Clean up any water that spills on the floor as soon as it happens. ?Remove soap buildup in the tub or shower regularly. ?Attach bath mats securely with double-sided non-slip rug tape. ?Do not have throw rugs and other things on the floor that can make you trip. ?What can I do in the bedroom? ?Use night lights. ?Make sure that you have a light by your bed that is easy to reach. ?Do not use any sheets or blankets that are too big for your bed. They should not hang down onto the floor. ?Have a firm chair that has side arms. You can use this for support while you get dressed. ?Do not have throw rugs and other things on the floor that can make you trip. ?What can I do in the kitchen? ?Clean up any spills right away. ?Avoid walking on wet floors. ?Keep items that you use a lot in easy-to-reach places. ?If you need to reach something above you, use a strong step stool that has a grab bar. ?Keep electrical cords out of the way. ?Do not use floor polish or wax that makes floors slippery. If you must use wax, use non-skid floor wax. ?Do  not have throw rugs and other things on the floor that can make you trip. ?What can I do with my stairs? ?Do not leave any items on the stairs. ?Make sure that there are handrails on both sides of the stairs and use them. Fix handrails that are broken or loose. Make sure that handrails are as long as the stairways. ?Check any carpeting to make sure that it is firmly attached to the stairs. Fix any carpet that is loose or worn. ?Avoid having throw rugs at the top or bottom of the stairs. If you do have throw rugs, attach them to the floor with carpet tape. ?Make sure that you have a light switch at the top of the stairs and the  bottom of the stairs. If you do not have them, ask someone to add them for you. ?What else can I do to help prevent falls? ?Wear shoes that: ?Do not have high heels. ?Have rubber bottoms. ?Are comfortable and f

## 2021-05-07 ENCOUNTER — Ambulatory Visit: Payer: Self-pay

## 2021-05-07 ENCOUNTER — Encounter: Payer: Self-pay | Admitting: Family

## 2021-05-07 ENCOUNTER — Ambulatory Visit
Admission: RE | Admit: 2021-05-07 | Discharge: 2021-05-07 | Disposition: A | Discharge status: Home / Self Care | Payer: Medicare HMO | Source: Ambulatory Visit | Attending: Internal Medicine | Admitting: Internal Medicine

## 2021-05-07 DIAGNOSIS — N6342 Unspecified lump in left breast, subareolar: Secondary | ICD-10-CM

## 2021-05-07 DIAGNOSIS — R922 Inconclusive mammogram: Secondary | ICD-10-CM | POA: Diagnosis not present

## 2021-05-07 DIAGNOSIS — N644 Mastodynia: Secondary | ICD-10-CM | POA: Diagnosis not present

## 2021-05-07 DIAGNOSIS — N6310 Unspecified lump in the right breast, unspecified quadrant: Secondary | ICD-10-CM

## 2021-05-11 DIAGNOSIS — R69 Illness, unspecified: Secondary | ICD-10-CM | POA: Diagnosis not present

## 2021-05-12 ENCOUNTER — Encounter: Payer: Self-pay | Admitting: Internal Medicine

## 2021-05-16 ENCOUNTER — Ambulatory Visit: Payer: Medicare HMO

## 2021-05-16 ENCOUNTER — Encounter: Payer: Medicare HMO | Admitting: Diagnostic Neuroimaging

## 2021-05-25 DIAGNOSIS — R69 Illness, unspecified: Secondary | ICD-10-CM | POA: Diagnosis not present

## 2021-05-25 DIAGNOSIS — F4323 Adjustment disorder with mixed anxiety and depressed mood: Secondary | ICD-10-CM | POA: Diagnosis not present

## 2021-05-30 DIAGNOSIS — M47816 Spondylosis without myelopathy or radiculopathy, lumbar region: Secondary | ICD-10-CM | POA: Diagnosis not present

## 2021-06-03 DIAGNOSIS — M545 Low back pain, unspecified: Secondary | ICD-10-CM | POA: Diagnosis not present

## 2021-06-03 DIAGNOSIS — M5136 Other intervertebral disc degeneration, lumbar region: Secondary | ICD-10-CM | POA: Diagnosis not present

## 2021-06-03 DIAGNOSIS — G8929 Other chronic pain: Secondary | ICD-10-CM | POA: Diagnosis not present

## 2021-06-03 DIAGNOSIS — G894 Chronic pain syndrome: Secondary | ICD-10-CM | POA: Diagnosis not present

## 2021-06-11 ENCOUNTER — Encounter: Payer: Self-pay | Admitting: Internal Medicine

## 2021-06-13 ENCOUNTER — Other Ambulatory Visit: Payer: Self-pay | Admitting: Internal Medicine

## 2021-06-15 DIAGNOSIS — M170 Bilateral primary osteoarthritis of knee: Secondary | ICD-10-CM | POA: Diagnosis not present

## 2021-06-16 ENCOUNTER — Ambulatory Visit (HOSPITAL_COMMUNITY)
Admission: EM | Admit: 2021-06-16 | Discharge: 2021-06-16 | Disposition: A | Discharge status: Home / Self Care | Payer: Medicare HMO | Attending: Physician Assistant | Admitting: Physician Assistant

## 2021-06-16 ENCOUNTER — Other Ambulatory Visit: Payer: Self-pay

## 2021-06-16 ENCOUNTER — Encounter (HOSPITAL_COMMUNITY): Payer: Self-pay | Admitting: Emergency Medicine

## 2021-06-16 DIAGNOSIS — Z113 Encounter for screening for infections with a predominantly sexual mode of transmission: Secondary | ICD-10-CM | POA: Diagnosis not present

## 2021-06-16 DIAGNOSIS — N898 Other specified noninflammatory disorders of vagina: Secondary | ICD-10-CM | POA: Diagnosis not present

## 2021-06-16 DIAGNOSIS — R69 Illness, unspecified: Secondary | ICD-10-CM | POA: Diagnosis not present

## 2021-06-16 DIAGNOSIS — N8110 Cystocele, unspecified: Secondary | ICD-10-CM | POA: Insufficient documentation

## 2021-06-16 LAB — POCT URINALYSIS DIPSTICK, ED / UC
Bilirubin Urine: NEGATIVE
Glucose, UA: NEGATIVE mg/dL
Ketones, ur: NEGATIVE mg/dL
Leukocytes,Ua: NEGATIVE
Nitrite: NEGATIVE
Protein, ur: NEGATIVE mg/dL
Specific Gravity, Urine: 1.01 (ref 1.005–1.030)
Urobilinogen, UA: 0.2 mg/dL (ref 0.0–1.0)
pH: 6.5 (ref 5.0–8.0)

## 2021-06-16 NOTE — ED Triage Notes (Signed)
Patient noticed blood when wiping on Wednesday.  Prior to that, felt like band around opening of vagina.  Patient reports visualizing perineum, notices "a ball with holes below clitoris".  Patient did notice pain with urination.  Has noticed increase in chronic back pain and has noticed fatigue, headache.   ?

## 2021-06-16 NOTE — ED Provider Notes (Signed)
?[facility] ? ? ? ?CSN: [id number] ?Arrival date & time: 06/16/21  1004 ? ? ?  ? ?History   ?Chief Complaint ?Chief Complaint  ?Patient presents with  ? Urinary Tract Infection  ? ? ?HPI ?Macon LargeKimberley Claudie FishermanMarshall Guerrero is a 56 y.o. female.  ? ?Patient presents today with a weeklong history of vaginal irritation and discomfort.  She feels there is a mass on the inside of her vagina as well as a tightening of the introitus.  She was concerned that she might have a urinary tract infection as she is experiencing more discomfort in her lower back as well as pain with urination.  She has had a hysterectomy several years ago but denies additional urogenital procedures.  She reports associated dyspareunia as well as vaginal dryness.  She is not currently on hormone replacement therapy but is followed closely by GYN. ? ? ?Past Medical History:  ?Diagnosis Date  ? Anxiety   ? Arthritis   ? DDD (degenerative disc disease)   ? Depression   ? Hypothyroidism 12/08/2012  ? Iron deficiency anemia, unspecified 11/10/2012  ? Iron malabsorption 01/20/2017  ? Obesity   ? Thyroid disease hypothroidism  ? ? ?Patient Active Problem List  ? Diagnosis Date Noted  ? Mass of right breast 04/14/2021  ? Class 3 severe obesity due to excess calories with serious comorbidity and body mass index (BMI) of 40.0 to 44.9 in adult [facility](HCC) 07/04/2020  ? Sacroiliitis (HCC) 11/08/2018  ? Moderate episode of recurrent major depressive disorder (HCC) 11/08/2018  ? Post laminectomy syndrome 02/11/2018  ? Chronic pain syndrome 02/11/2018  ? Opioid use, unspecified, uncomplicated 02/11/2018  ? Iron malabsorption 01/20/2017  ? Fatigue 12/08/2012  ? Hypothyroidism 12/08/2012  ? Iron deficiency anemia in a patient s/p gastric bypass 11/10/2012  ? ? ?Past Surgical History:  ?Procedure Laterality Date  ? ABDOMINAL HYSTERECTOMY  1999  ? BACK SURGERY    ? CESAREAN SECTION  1991  ? CHOLECYSTECTOMY N/A 12/22/2017  ? Procedure: LAPAROSCOPIC CHOLECYSTECTOMY;  Surgeon:  Axel Filleramirez, Armando, MD;  [facility];  Service: General;  Laterality: N/A;  ? GASTRIC BYPASS  2007  ? KNEE SURGERY Left 2000  ? ? ?OB History   ?No obstetric history on file. ?  ? ? ? ?Home Medications   ? ?Prior to Admission medications   ?Medication Sig Start Date End Date Taking? Authorizing Provider  ?Biotin 10 MG CAPS Take 30 mg by mouth daily.     [provider]  ?brimonidine (ALPHAGAN) 0.2 % ophthalmic solution Place 1 drop into the right eye 2 (two) times daily.    [provider]  ?buPROPion (WELLBUTRIN XL) 150 MG 24 hr tablet TAKE 1 TABLET BY MOUTH EVERY DAY 02/18/21   Dorothyann PengSanders, Robyn, MD  ?CALCIUM+D3 600-800 MG-UNIT TABS Take by mouth. 2 capsules daily    [provider]  ?cetirizine (ZYRTEC) 10 MG tablet Take 10 mg by mouth daily.    [provider]  ?Cholecalciferol (VITAMIN D3) 2000 units TABS Take 10,000 Units by mouth daily.    [provider]  ?conjugated estrogens (PREMARIN) vaginal cream 0.5 gram 07/12/18   [provider]  ?Cyanocobalamin (VITAMIN B-12) 5000 MCG SUBL Take 10,000 mcg by mouth daily. ?Patient not taking: Reported on 04/11/2021    [provider]  ?cycloSPORINE (RESTASIS) 0.05 % ophthalmic emulsion Place 1 drop into both eyes 2 (two) times daily.    [provider]  ?Dextromethorphan-guaiFENesin (MUCINEX DM MAXIMUM STRENGTH) 60-1200 MG TB12 Take  1 tablet by mouth 2 (two) times daily.    [provider]  ?diphenhydrAMINE (BENADRYL) 25 mg capsule Take 50 mg by mouth 2 (two) times daily.    [provider]  ?docusate sodium (COLACE) 100 MG capsule Take 200 mg by mouth 2 (two) times daily.     [provider]  ?FIBER PO Take 4 capsules by mouth daily. prn    [provider]  ?ibuprofen (ADVIL) 800 MG tablet Take 800 mg by mouth 3 (three) times daily as needed. 12/25/18   [provider]  ?imipramine (TOFRANIL) 25 MG tablet Take 25 mg by mouth 3 (three) times daily. 3 tabs 2  times per day 12/30/16   [provider]  ?levothyroxine (SYNTHROID) 100 MCG tablet TAKE ONE TABLET M-F AND TAKE 1/2 PILL ON SATURDAY/SUNDAY. 06/14/21   [name], MD  ?Liniments [facility] PAIN RELIEF PATCH EX) Apply 1 patch topically daily.    [provider]  ?MAGNESIUM PO Take 5 tablets by mouth daily.     [provider]  ?MENTHOL-METHYL SALICYLATE EX Apply topically. Apply one patch topically daily    [provider]  ?naloxone (NARCAN) nasal spray 4 mg/0.1 mL See admin instructions.    [provider]  ?omeprazole (PRILOSEC) 40 MG capsule TAKE 1 CAPSULE BY MOUTH EVERY DAY 04/03/21   [name], MD  ?OVER THE COUNTER MEDICATION Apply 1 application topically 2 (two) times daily. [name] and [place] Arthritis Cream    [provider]  ?Oxycodone HCl 20 MG TABS SMARTSIG:0.5-1 Tablet(s) By Mouth 4-6 Times Daily 04/08/21   [provider]  ?Prasterone, DHEA, (DHEA 50) 50 MG CAPS See admin instructions. ?Patient not taking: Reported on 04/11/2021    [provider]  ?predniSONE (DELTASONE) 10 MG tablet 12 day tapering dose ?Patient not taking: Reported on 05/01/2021 04/10/21   Lenn Sink, DPM  ?tiZANidine (ZANAFLEX) 4 MG tablet every 4 (four) hours as needed. 06/27/19   [provider]  ?topiramate (TOPAMAX) 100 MG tablet Take 100 mg by mouth 4 (four) times daily.     [provider]  ?Turmeric Curcumin 500 MG CAPS Take 1,000 mg by mouth 2 (two) times daily.    [provider]  ?valACYclovir (VALTREX) 500 MG tablet as needed. 04/27/19   [provider]  ?ZINC-VITAMIN C PO Take 6 tablets by mouth daily.    [provider]  ? ? ?Family History ?Family History  ?Problem Relation Age of Onset  ? Heart failure Mother   ? Prostate cancer Father   ? Colon cancer Neg Hx   ? Diabetes Neg Hx   ? Kidney disease Neg Hx   ? Liver disease Neg Hx   ? Neuropathy Neg Hx   ? ? ?Social History ?Social History   ? ?Tobacco Use  ? Smoking status: Never  ? Smokeless tobacco: Never  ?Vaping Use  ? Vaping Use: Never used  ?Substance Use Topics  ? Alcohol use: Not Currently  ? Drug use: Yes  ?  Types: Oxycodone  ? ? ? ?Allergies   ?Codeine ? ? ?Review of Systems ?Review of Systems  ?Constitutional:  Positive for activity change. Negative for appetite change, fatigue and fever.  ?Gastrointestinal:  Negative for abdominal pain, diarrhea, nausea and vomiting.  ?Genitourinary:  Positive for dyspareunia, pelvic pain and vaginal pain. Negative for dysuria, frequency, genital sores, menstrual problem, urgency, vaginal bleeding and vaginal discharge.  ?Musculoskeletal:  Positive for back pain. Negative for arthralgias and  myalgias.  ? ? ?Physical Exam ?Triage Vital Signs ?ED Triage Vitals  ?Enc Vitals Group  ?   BP 06/16/21 1031 114/80  ?   Pulse Rate 06/16/21 1031 90  ?   Resp 06/16/21 1031 20  ?   Temp 06/16/21 1031 98.9 ?F (37.2 ?C)  ?   Temp Source 06/16/21 1031 Oral  ?   SpO2 06/16/21 1031 99 %  ?   Weight --   ?   Height --   ?   Head Circumference --   ?   Peak Flow --   ?   Pain Score 06/16/21 1028 8  ?   Pain Loc --   ?   Pain Edu? --   ?   Excl. in GC? --   ? ?No data found. ? ?Updated Vital Signs ?BP 114/80 (BP Location: Right Arm) Comment (BP Location): large cuff  Pulse 90   Temp 98.9 ?F (37.2 ?C) (Oral)   Resp 20   SpO2 99%  ? ?Visual Acuity ?Right Eye Distance:   ?Left Eye Distance:   ?Bilateral Distance:   ? ?Right Eye Near:   ?Left Eye Near:    ?Bilateral Near:    ? ?Physical Exam ?Vitals reviewed.  ?Constitutional:   ?   General: She is awake. She is not in acute distress. ?   Appearance: Normal appearance. She is well-developed. She is not ill-appearing.  ?   Comments: Very pleasant female appears stated age in no acute distress sitting comfortably on exam table  ?HENT:  ?   Head: Normocephalic and atraumatic.  ?Pulmonary:  ?   Effort: Pulmonary effort is normal. No tachypnea or respiratory distress.  ?Abdominal:   ?   Palpations: Abdomen is soft.  ?   Tenderness: There is no abdominal tenderness.  ?Genitourinary: ?   Labia:     ?   Right: No rash or tenderness.     ?   Left: No rash or tenderness.   ?   Urethra: Prolapse pr

## 2021-06-16 NOTE — Discharge Instructions (Addendum)
I believe your symptoms are related to something called a cystocele which is where your bladder is not in the correct place.  Ultimately, you need to see a urogynecologist.  I would recommend calling and scheduling an appointment with your regular gynecologist and they can help direct your care.  You can use lubricant to help with vaginal dryness and irritation.  If you develop any worsening symptoms you need to return for reevaluation. ?

## 2021-06-17 LAB — CERVICOVAGINAL ANCILLARY ONLY
Bacterial Vaginitis (gardnerella): NEGATIVE
Candida Glabrata: NEGATIVE
Candida Vaginitis: NEGATIVE
Chlamydia: NEGATIVE
Comment: NEGATIVE
Comment: NEGATIVE
Comment: NEGATIVE
Comment: NEGATIVE
Comment: NEGATIVE
Comment: NORMAL
Neisseria Gonorrhea: POSITIVE — AB
Trichomonas: NEGATIVE

## 2021-06-18 ENCOUNTER — Telehealth (HOSPITAL_COMMUNITY): Payer: Self-pay | Admitting: Emergency Medicine

## 2021-06-18 ENCOUNTER — Ambulatory Visit (HOSPITAL_COMMUNITY)
Admission: EM | Admit: 2021-06-18 | Discharge: 2021-06-18 | Disposition: A | Discharge status: Home / Self Care | Payer: Medicare HMO | Attending: Internal Medicine | Admitting: Internal Medicine

## 2021-06-18 ENCOUNTER — Encounter (HOSPITAL_COMMUNITY): Payer: Self-pay

## 2021-06-18 DIAGNOSIS — A549 Gonococcal infection, unspecified: Secondary | ICD-10-CM | POA: Diagnosis not present

## 2021-06-18 DIAGNOSIS — R69 Illness, unspecified: Secondary | ICD-10-CM | POA: Diagnosis not present

## 2021-06-18 MED ORDER — CEFTRIAXONE SODIUM 500 MG IJ SOLR
INTRAMUSCULAR | Status: AC
  Filled 2021-06-18: qty 500

## 2021-06-18 MED ORDER — CEFTRIAXONE SODIUM 500 MG IJ SOLR
500.0000 mg | Freq: Once | INTRAMUSCULAR | Status: AC
  Administered 2021-06-18: 500 mg via INTRAMUSCULAR

## 2021-06-18 NOTE — Telephone Encounter (Signed)
Per protocol, patient will need treatment with IM Rocephin 500mg  for positive Gonorrhea.   ?Results seen on mychart ?Contacted patient by phone.  Verified identity using two identifiers.  Provided positive result.  Reviewed safe sex practices, notifying partners, and refraining from sexual activities for 7 days from time of treatment.  Patient verified understanding, all questions answered.   ?HHS notified ?

## 2021-06-18 NOTE — ED Triage Notes (Signed)
Pt presents for treatment of gonorrhea per cytology swab results from last visit and per MD orders.  ?

## 2021-06-19 DIAGNOSIS — R300 Dysuria: Secondary | ICD-10-CM | POA: Diagnosis not present

## 2021-06-19 DIAGNOSIS — N8111 Cystocele, midline: Secondary | ICD-10-CM | POA: Diagnosis not present

## 2021-06-19 DIAGNOSIS — R102 Pelvic and perineal pain: Secondary | ICD-10-CM | POA: Diagnosis not present

## 2021-06-29 DIAGNOSIS — R69 Illness, unspecified: Secondary | ICD-10-CM | POA: Diagnosis not present

## 2021-06-29 DIAGNOSIS — F4323 Adjustment disorder with mixed anxiety and depressed mood: Secondary | ICD-10-CM | POA: Diagnosis not present

## 2021-07-04 DIAGNOSIS — R102 Pelvic and perineal pain: Secondary | ICD-10-CM | POA: Diagnosis not present

## 2021-07-10 ENCOUNTER — Encounter: Payer: Medicare Other | Admitting: Internal Medicine

## 2021-07-10 DIAGNOSIS — M545 Low back pain, unspecified: Secondary | ICD-10-CM | POA: Diagnosis not present

## 2021-07-10 DIAGNOSIS — G894 Chronic pain syndrome: Secondary | ICD-10-CM | POA: Diagnosis not present

## 2021-07-10 DIAGNOSIS — M5136 Other intervertebral disc degeneration, lumbar region: Secondary | ICD-10-CM | POA: Diagnosis not present

## 2021-07-22 ENCOUNTER — Encounter: Payer: Self-pay | Admitting: Internal Medicine

## 2021-07-22 ENCOUNTER — Ambulatory Visit (INDEPENDENT_AMBULATORY_CARE_PROVIDER_SITE_OTHER): Payer: Medicare HMO | Admitting: Internal Medicine

## 2021-07-22 VITALS — BP 110/72 | HR 88 | Temp 98.7°F | Ht 64.4 in | Wt 250.8 lb

## 2021-07-22 DIAGNOSIS — E039 Hypothyroidism, unspecified: Secondary | ICD-10-CM | POA: Diagnosis not present

## 2021-07-22 DIAGNOSIS — Z0000 Encounter for general adult medical examination without abnormal findings: Secondary | ICD-10-CM | POA: Diagnosis not present

## 2021-07-22 DIAGNOSIS — R0982 Postnasal drip: Secondary | ICD-10-CM

## 2021-07-22 DIAGNOSIS — Z6841 Body Mass Index (BMI) 40.0 and over, adult: Secondary | ICD-10-CM

## 2021-07-22 DIAGNOSIS — J309 Allergic rhinitis, unspecified: Secondary | ICD-10-CM | POA: Diagnosis not present

## 2021-07-22 MED ORDER — NOREL AD 4-10-325 MG PO TABS: 4.0000 mg | ORAL_TABLET | Freq: Two times a day (BID) | ORAL | 0 refills | Status: DC | PRN

## 2021-07-22 MED ORDER — MONTELUKAST SODIUM 10 MG PO TABS: 10.0000 mg | ORAL_TABLET | Freq: Every day | ORAL | 2 refills | Status: DC

## 2021-07-22 NOTE — Patient Instructions (Signed)

## 2021-07-22 NOTE — Progress Notes (Signed)
[name],acting as a Education administrator for [name], MD.,have documented all relevant documentation on the behalf of [name], MD,as directed by  [name], MD while in the presence of [name], MD.  This visit occurred during the SARS-CoV-2 public health emergency.  Safety protocols were in place, including screening questions prior to the visit, additional usage of staff PPE, and extensive cleaning of exam room while observing appropriate contact time as indicated for disinfecting solutions.  Subjective:     Patient ID: Natalie Guerrero , female    DOB: [birth year] , 56 y.o.   MRN: [id number]   Chief Complaint  Patient presents with   Annual Exam    HPI  She is here today for a full physical exam. She is followed by GYN, Dr. Landry Mellow for her pelvic exams. She recently had pelvic ultrasound for further evaluation of pelvic pain.   Patient stated she thinks she has a sinus infection. She has a headache, runny nose and postnasal drip. She reports she is using cough drops to help soothe her throat. She has tried Zyrtec with some relief of her sx. She is also taking Mucinex daily.   Thyroid Problem Presents for follow-up visit. Patient reports no cold intolerance, constipation, menstrual problem, nail problem, palpitations or tremors.     Past Medical History:  Diagnosis Date   Anxiety    Arthritis    DDD (degenerative disc disease)    Depression    Hypothyroidism 12/08/2012   Iron deficiency anemia, unspecified 11/10/2012   Iron malabsorption 01/20/2017   Obesity    Thyroid disease hypothroidism     Family History  Problem Relation Age of Onset   Heart failure Mother    Prostate cancer Father    Colon cancer Neg Hx    Diabetes Neg Hx    Kidney disease Neg Hx    Liver disease Neg Hx    Neuropathy Neg Hx      Current Outpatient Medications:    Biotin 10 MG CAPS, Take 30 mg by mouth daily. , Disp: , Rfl:    brimonidine (ALPHAGAN) 0.2 %  ophthalmic solution, Place 1 drop into the right eye 2 (two) times daily., Disp: , Rfl:    buPROPion (WELLBUTRIN XL) 150 MG 24 hr tablet, TAKE 1 TABLET BY MOUTH EVERY DAY, Disp: 90 tablet, Rfl: 1   cetirizine (ZYRTEC) 10 MG tablet, Take 10 mg by mouth daily., Disp: , Rfl:    Chlorphen-PE-Acetaminophen (NOREL AD) 4-10-325 MG TABS, Take 4-10 mg by mouth 2 (two) times daily as needed., Disp: 20 tablet, Rfl: 0   cycloSPORINE (RESTASIS) 0.05 % ophthalmic emulsion, Place 1 drop into both eyes 2 (two) times daily., Disp: , Rfl:    Dextromethorphan-guaiFENesin (MUCINEX DM MAXIMUM STRENGTH) 60-1200 MG TB12, Take 1 tablet by mouth 2 (two) times daily., Disp: , Rfl:    diphenhydrAMINE (BENADRYL) 25 mg capsule, Take 50 mg by mouth 2 (two) times daily., Disp: , Rfl:    docusate sodium (COLACE) 100 MG capsule, Take 200 mg by mouth 2 (two) times daily. , Disp: , Rfl:    FIBER PO, Take 4 capsules by mouth daily. prn, Disp: , Rfl:    ibuprofen (ADVIL) 800 MG tablet, Take 800 mg by mouth 3 (three) times daily as needed., Disp: , Rfl:    imipramine (TOFRANIL) 25 MG tablet, Take 25 mg by mouth 3 (three) times daily. 3 tabs 2 times per day, Disp: , Rfl: 4  levothyroxine (SYNTHROID) 100 MCG tablet, TAKE ONE TABLET M-F AND TAKE 1/2 PILL ON SATURDAY/SUNDAY., Disp: 90 tablet, Rfl: 0   montelukast (SINGULAIR) 10 MG tablet, Take 1 tablet (10 mg total) by mouth daily., Disp: 30 tablet, Rfl: 2   naloxone (NARCAN) nasal spray 4 mg/0.1 mL, See admin instructions., Disp: , Rfl:    omeprazole (PRILOSEC) 40 MG capsule, TAKE 1 CAPSULE BY MOUTH EVERY DAY, Disp: 90 capsule, Rfl: 1   Oxycodone HCl 20 MG TABS, SMARTSIG:0.5-1 Tablet(s) By Mouth 4-6 Times Daily, Disp: , Rfl:    tiZANidine (ZANAFLEX) 4 MG tablet, every 4 (four) hours as needed., Disp: , Rfl:    topiramate (TOPAMAX) 100 MG tablet, Take 100 mg by mouth 4 (four) times daily. , Disp: , Rfl: 5   valACYclovir (VALTREX) 500 MG tablet, as needed., Disp: , Rfl:    MENTHOL-METHYL  SALICYLATE EX, Apply topically. Apply one patch topically daily, Disp: , Rfl:    Prasterone, DHEA, (DHEA 50) 50 MG CAPS, See admin instructions. (Patient not taking: Reported on 04/11/2021), Disp: , Rfl:   Current Facility-Administered Medications:    triamcinolone acetonide (KENALOG) 10 MG/ML injection 20 mg, 20 mg, Other, Once, Regal, [name], DPM   Allergies  Allergen Reactions   Codeine Itching    The patient states she uses status post hysterectomy for birth control. Last LMP was No LMP recorded. Patient has had a hysterectomy.. Negative for Dysmenorrhea. Negative for: breast discharge, breast lump(s), breast pain and breast self exam. Associated symptoms include abnormal vaginal bleeding. Pertinent negatives include abnormal bleeding (hematology), anxiety, decreased libido, depression, difficulty falling sleep, dyspareunia, history of infertility, nocturia, sexual dysfunction, sleep disturbances, urinary incontinence, urinary urgency, vaginal discharge and vaginal itching. Diet regular.The patient states her exercise level is    . The patient's tobacco use is:  Social History   Tobacco Use  Smoking Status Never  Smokeless Tobacco Never  . She has been exposed to passive smoke. The patient's alcohol use is:  Social History   Substance and Sexual Activity  Alcohol Use Not Currently   Review of Systems  Constitutional: Negative.   HENT:  Positive for ear pain, postnasal drip, rhinorrhea, sore throat and voice change.   Eyes: Negative.   Respiratory: Negative.    Cardiovascular: Negative.  Negative for palpitations.  Gastrointestinal: Negative.  Negative for constipation.  Endocrine: Negative.  Negative for cold intolerance.  Genitourinary: Negative.  Negative for menstrual problem.  Musculoskeletal: Negative.   Skin: Negative.   Allergic/Immunologic: Negative.   Neurological: Negative.  Negative for tremors.  Hematological: Negative.   Psychiatric/Behavioral: Negative.        Today's Vitals   07/22/21 0840  BP: 110/72  Pulse: 88  Temp: 98.7 F (37.1 C)  Weight: 250 lb 12.8 oz (113.8 kg)  Height: 5' 4.4" (1.636 m)  PainSc: 0-No pain   Body mass index is 42.52 kg/m.  Wt Readings from Last 3 Encounters:  07/22/21 250 lb 12.8 oz (113.8 kg)  05/01/21 257 lb (116.6 kg)  04/11/21 259 lb 12.8 oz (117.8 kg)     Objective:  Physical Exam Vitals and nursing note reviewed.  Constitutional:      Appearance: Normal appearance. She is obese.  HENT:     Head: Normocephalic and atraumatic.     Right Ear: Tympanic membrane, ear canal and external ear normal.     Left Ear: Tympanic membrane, ear canal and external ear normal.     Nose: Nose normal.     Mouth/Throat:  Mouth: Mucous membranes are moist.     Pharynx: Oropharynx is clear. Posterior oropharyngeal erythema present. No oropharyngeal exudate.  Eyes:     Extraocular Movements: Extraocular movements intact.     Conjunctiva/sclera: Conjunctivae normal.     Pupils: Pupils are equal, round, and reactive to light.  Cardiovascular:     Rate and Rhythm: Normal rate and regular rhythm.     Pulses: Normal pulses.     Heart sounds: Normal heart sounds.  Pulmonary:     Effort: Pulmonary effort is normal.     Breath sounds: Normal breath sounds.  Abdominal:     General: Bowel sounds are normal.     Palpations: Abdomen is soft.  Genitourinary:    Comments: deferred Musculoskeletal:        General: Normal range of motion.     Cervical back: Normal range of motion and neck supple.     Comments: Paravertebral mm. Tenderness in lumbar area  Skin:    General: Skin is warm and dry.  Neurological:     General: No focal deficit present.     Mental Status: She is alert and oriented to person, place, and time.  Psychiatric:        Mood and Affect: Mood normal.        Behavior: Behavior normal.     Assessment And Plan:     1. Encounter for general adult medical examination w/o abnormal  findings Comments: A full exam was performed. Importance of monthly self breast exams was discussed with the patient. PATIENT IS ADVISED TO GET 30-45 MINUTES REGULAR EXERCISE NO LESS THAN FOUR TO FIVE DAYS PER WEEK - BOTH WEIGHTBEARING EXERCISES AND AEROBIC ARE RECOMMENDED.  PATIENT IS ADVISED TO FOLLOW A HEALTHY DIET WITH AT LEAST SIX FRUITS/VEGGIES PER DAY, DECREASE INTAKE OF RED MEAT, AND TO INCREASE FISH INTAKE TO TWO DAYS PER WEEK.  MEATS/FISH SHOULD NOT BE FRIED, BAKED OR BROILED IS PREFERABLE.  IT IS ALSO IMPORTANT TO CUT BACK ON YOUR SUGAR INTAKE. PLEASE AVOID ANYTHING WITH ADDED SUGAR, CORN SYRUP OR OTHER SWEETENERS. IF YOU MUST USE A SWEETENER, YOU CAN TRY STEVIA. IT IS ALSO IMPORTANT TO AVOID ARTIFICIALLY SWEETENERS AND DIET BEVERAGES. LASTLY, I SUGGEST WEARING SPF 50 SUNSCREEN ON EXPOSED PARTS AND ESPECIALLY WHEN IN THE DIRECT SUNLIGHT FOR AN EXTENDED PERIOD OF TIME.  PLEASE AVOID FAST FOOD RESTAURANTS AND INCREASE YOUR WATER INTAKE.  2. Primary hypothyroidism Comments: I will check thyroid panel and adjust meds as needed. She is currently taking levothyroxine 163mcg M-F,encouraged to keep lab appt if needed. F/u 6 months. - CBC - CMP14+EGFR - Lipid panel - TSH - T4, Free - Insulin, random(561)  3. Allergic rhinitis with postnasal drip Comments: I will send short course of Norel AD, taken twice daily. She will follow with montelukast $RemoveBeforeDE'10mg'wseKwPErjaEuoUS$  nightly.  - Chlorphen-PE-Acetaminophen (NOREL AD) 4-10-325 MG TABS; Take 4-10 mg by mouth 2 (two) times daily as needed.  Dispense: 20 tablet; Refill: 0 - montelukast (SINGULAIR) 10 MG tablet; Take 1 tablet (10 mg total) by mouth daily.  Dispense: 30 tablet; Refill: 2  4. Class 3 severe obesity due to excess calories with serious comorbidity and body mass index (BMI) of 40.0 to 44.9 in adult [facility]) She was congratulated on her 7lb weight loss since March 2023. She is encouraged to aim for at least 150 minutes of exercise per week.   Patient was given  opportunity to ask questions. Patient verbalized understanding of the plan and was able to repeat  key elements of the plan. All questions were answered to their satisfaction.   I, [name], MD, have reviewed all documentation for this visit. The documentation on 07/22/21 for the exam, diagnosis, procedures, and orders are all accurate and complete.   THE PATIENT IS ENCOURAGED TO PRACTICE SOCIAL DISTANCING DUE TO THE COVID-19 PANDEMIC.

## 2021-07-23 LAB — CMP14+EGFR
ALT: 15 IU/L (ref 0–32)
AST: 19 IU/L (ref 0–40)
Albumin/Globulin Ratio: 1.6 (ref 1.2–2.2)
Albumin: 3.9 g/dL (ref 3.8–4.9)
Alkaline Phosphatase: 106 IU/L (ref 44–121)
BUN/Creatinine Ratio: 8 — ABNORMAL LOW (ref 9–23)
BUN: 8 mg/dL (ref 6–24)
Bilirubin Total: <0.2 mg/dL (ref 0.0–1.2)
CO2: 20 mmol/L (ref 20–29)
Calcium: 9.1 mg/dL (ref 8.7–10.2)
Chloride: 106 mmol/L (ref 96–106)
Creatinine, Ser: 0.95 mg/dL (ref 0.57–1.00)
Globulin, Total: 2.5 g/dL (ref 1.5–4.5)
Glucose: 93 mg/dL (ref 70–99)
Potassium: 4.2 mmol/L (ref 3.5–5.2)
Sodium: 141 mmol/L (ref 134–144)
Total Protein: 6.4 g/dL (ref 6.0–8.5)
eGFR: 70 mL/min/{1.73_m2} (ref >59)

## 2021-07-23 LAB — TSH: TSH: 0.352 u[IU]/mL — ABNORMAL LOW (ref 0.450–4.500)

## 2021-07-23 LAB — CBC
Hematocrit: 29.6 % — ABNORMAL LOW (ref 34.0–46.6)
Hemoglobin: 9.3 g/dL — ABNORMAL LOW (ref 11.1–15.9)
MCH: 28 pg (ref 26.6–33.0)
MCHC: 31.4 g/dL — ABNORMAL LOW (ref 31.5–35.7)
MCV: 89 fL (ref 79–97)
Platelets: 397 10*3/uL (ref 150–450)
RBC: 3.32 x10E6/uL — ABNORMAL LOW (ref 3.77–5.28)
RDW: 14.6 % (ref 11.7–15.4)
WBC: 6.6 10*3/uL (ref 3.4–10.8)

## 2021-07-23 LAB — T4, FREE: Free T4: 0.96 ng/dL (ref 0.82–1.77)

## 2021-07-23 LAB — LIPID PANEL
Chol/HDL Ratio: 2.4 ratio (ref 0.0–4.4)
Cholesterol, Total: 162 mg/dL (ref 100–199)
HDL: 67 mg/dL (ref >39)
LDL Chol Calc (NIH): 81 mg/dL (ref 0–99)
Triglycerides: 75 mg/dL (ref 0–149)
VLDL Cholesterol Cal: 14 mg/dL (ref 5–40)

## 2021-07-23 LAB — INSULIN, RANDOM: INSULIN: 2.8 u[IU]/mL (ref 2.6–24.9)

## 2021-07-29 DIAGNOSIS — F4323 Adjustment disorder with mixed anxiety and depressed mood: Secondary | ICD-10-CM | POA: Diagnosis not present

## 2021-07-29 DIAGNOSIS — R69 Illness, unspecified: Secondary | ICD-10-CM | POA: Diagnosis not present

## 2021-07-31 ENCOUNTER — Other Ambulatory Visit: Payer: Self-pay

## 2021-07-31 MED ORDER — LEVOTHYROXINE SODIUM 75 MCG PO TABS: ORAL_TABLET | ORAL | 1 refills | Status: DC

## 2021-08-02 ENCOUNTER — Other Ambulatory Visit: Payer: Self-pay | Admitting: Internal Medicine

## 2021-08-07 DIAGNOSIS — M179 Osteoarthritis of knee, unspecified: Secondary | ICD-10-CM | POA: Diagnosis not present

## 2021-08-07 DIAGNOSIS — G894 Chronic pain syndrome: Secondary | ICD-10-CM | POA: Diagnosis not present

## 2021-08-07 DIAGNOSIS — M9904 Segmental and somatic dysfunction of sacral region: Secondary | ICD-10-CM | POA: Diagnosis not present

## 2021-08-07 DIAGNOSIS — M5136 Other intervertebral disc degeneration, lumbar region: Secondary | ICD-10-CM | POA: Diagnosis not present

## 2021-08-29 DIAGNOSIS — M533 Sacrococcygeal disorders, not elsewhere classified: Secondary | ICD-10-CM | POA: Diagnosis not present

## 2021-08-30 ENCOUNTER — Other Ambulatory Visit: Payer: Self-pay | Admitting: Internal Medicine

## 2021-08-30 DIAGNOSIS — J309 Allergic rhinitis, unspecified: Secondary | ICD-10-CM

## 2021-09-04 DIAGNOSIS — M179 Osteoarthritis of knee, unspecified: Secondary | ICD-10-CM | POA: Diagnosis not present

## 2021-09-04 DIAGNOSIS — M9904 Segmental and somatic dysfunction of sacral region: Secondary | ICD-10-CM | POA: Diagnosis not present

## 2021-09-04 DIAGNOSIS — M5136 Other intervertebral disc degeneration, lumbar region: Secondary | ICD-10-CM | POA: Diagnosis not present

## 2021-09-04 DIAGNOSIS — G894 Chronic pain syndrome: Secondary | ICD-10-CM | POA: Diagnosis not present

## 2021-09-11 ENCOUNTER — Other Ambulatory Visit (INDEPENDENT_AMBULATORY_CARE_PROVIDER_SITE_OTHER): Payer: Medicare HMO

## 2021-09-11 ENCOUNTER — Other Ambulatory Visit: Payer: Medicare HMO

## 2021-09-11 DIAGNOSIS — M170 Bilateral primary osteoarthritis of knee: Secondary | ICD-10-CM | POA: Diagnosis not present

## 2021-09-11 DIAGNOSIS — M25562 Pain in left knee: Secondary | ICD-10-CM | POA: Diagnosis not present

## 2021-09-11 DIAGNOSIS — D509 Iron deficiency anemia, unspecified: Secondary | ICD-10-CM | POA: Diagnosis not present

## 2021-09-11 DIAGNOSIS — M1712 Unilateral primary osteoarthritis, left knee: Secondary | ICD-10-CM | POA: Diagnosis not present

## 2021-09-11 DIAGNOSIS — M1711 Unilateral primary osteoarthritis, right knee: Secondary | ICD-10-CM | POA: Diagnosis not present

## 2021-09-11 DIAGNOSIS — M25561 Pain in right knee: Secondary | ICD-10-CM | POA: Diagnosis not present

## 2021-09-11 DIAGNOSIS — E039 Hypothyroidism, unspecified: Secondary | ICD-10-CM

## 2021-09-11 LAB — HEMOCCULT GUIAC POC 1CARD (OFFICE)
Card #2 Fecal Occult Blod, POC: NEGATIVE
Card #3 Fecal Occult Blood, POC: NEGATIVE
Fecal Occult Blood, POC: NEGATIVE

## 2021-09-12 DIAGNOSIS — E039 Hypothyroidism, unspecified: Secondary | ICD-10-CM | POA: Diagnosis not present

## 2021-09-13 LAB — TSH: TSH: 0.429 u[IU]/mL — ABNORMAL LOW (ref 0.450–4.500)

## 2021-09-14 ENCOUNTER — Other Ambulatory Visit: Payer: Self-pay | Admitting: Internal Medicine

## 2021-09-14 ENCOUNTER — Encounter: Payer: Self-pay | Admitting: Internal Medicine

## 2021-09-16 ENCOUNTER — Other Ambulatory Visit: Payer: Self-pay

## 2021-09-16 DIAGNOSIS — D509 Iron deficiency anemia, unspecified: Secondary | ICD-10-CM

## 2021-09-17 DIAGNOSIS — H16223 Keratoconjunctivitis sicca, not specified as Sjogren's, bilateral: Secondary | ICD-10-CM | POA: Diagnosis not present

## 2021-09-17 DIAGNOSIS — H401131 Primary open-angle glaucoma, bilateral, mild stage: Secondary | ICD-10-CM | POA: Diagnosis not present

## 2021-09-17 DIAGNOSIS — H40012 Open angle with borderline findings, low risk, left eye: Secondary | ICD-10-CM | POA: Diagnosis not present

## 2021-09-19 ENCOUNTER — Other Ambulatory Visit: Payer: Self-pay | Admitting: Internal Medicine

## 2021-09-20 ENCOUNTER — Other Ambulatory Visit: Payer: Self-pay | Admitting: Family

## 2021-09-20 DIAGNOSIS — D509 Iron deficiency anemia, unspecified: Secondary | ICD-10-CM

## 2021-09-21 ENCOUNTER — Other Ambulatory Visit: Payer: Self-pay | Admitting: Internal Medicine

## 2021-09-22 ENCOUNTER — Other Ambulatory Visit: Payer: Self-pay | Admitting: Internal Medicine

## 2021-09-23 ENCOUNTER — Inpatient Hospital Stay: Payer: Medicare HMO | Attending: Family | Admitting: Family

## 2021-09-23 ENCOUNTER — Inpatient Hospital Stay: Payer: Medicare HMO

## 2021-09-23 ENCOUNTER — Encounter: Payer: Self-pay | Admitting: Family

## 2021-09-23 VITALS — BP 126/82 | HR 83 | Temp 98.0°F | Resp 18 | Wt 241.1 lb

## 2021-09-23 DIAGNOSIS — D509 Iron deficiency anemia, unspecified: Secondary | ICD-10-CM | POA: Insufficient documentation

## 2021-09-23 DIAGNOSIS — K912 Postsurgical malabsorption, not elsewhere classified: Secondary | ICD-10-CM | POA: Diagnosis not present

## 2021-09-23 DIAGNOSIS — Z9884 Bariatric surgery status: Secondary | ICD-10-CM | POA: Diagnosis not present

## 2021-09-23 LAB — CBC WITH DIFFERENTIAL (CANCER CENTER ONLY)
Abs Immature Granulocytes: 0.01 10*3/uL (ref 0.00–0.07)
Basophils Absolute: 0 10*3/uL (ref 0.0–0.1)
Basophils Relative: 1 %
Eosinophils Absolute: 0.1 10*3/uL (ref 0.0–0.5)
Eosinophils Relative: 2 %
HCT: 29.2 % — ABNORMAL LOW (ref 36.0–46.0)
Hemoglobin: 9 g/dL — ABNORMAL LOW (ref 12.0–15.0)
Immature Granulocytes: 0 %
Lymphocytes Relative: 43 %
Lymphs Abs: 2 10*3/uL (ref 0.7–4.0)
MCH: 28.6 pg (ref 26.0–34.0)
MCHC: 30.8 g/dL (ref 30.0–36.0)
MCV: 92.7 fL (ref 80.0–100.0)
Monocytes Absolute: 0.4 10*3/uL (ref 0.1–1.0)
Monocytes Relative: 8 %
Neutro Abs: 2.1 10*3/uL (ref 1.7–7.7)
Neutrophils Relative %: 46 %
Platelet Count: 396 10*3/uL (ref 150–400)
RBC: 3.15 MIL/uL — ABNORMAL LOW (ref 3.87–5.11)
RDW: 16.2 % — ABNORMAL HIGH (ref 11.5–15.5)
WBC Count: 4.5 10*3/uL (ref 4.0–10.5)
nRBC: 0 % (ref 0.0–0.2)

## 2021-09-23 LAB — FERRITIN: Ferritin: 5 ng/mL — ABNORMAL LOW (ref 11–307)

## 2021-09-23 LAB — RETICULOCYTES
Immature Retic Fract: 22.8 % — ABNORMAL HIGH (ref 2.3–15.9)
RBC.: 3.17 MIL/uL — ABNORMAL LOW (ref 3.87–5.11)
Retic Count, Absolute: 54.2 10*3/uL (ref 19.0–186.0)
Retic Ct Pct: 1.7 % (ref 0.4–3.1)

## 2021-09-23 LAB — IRON AND IRON BINDING CAPACITY (CC-WL,HP ONLY)
Iron: 35 ug/dL (ref 28–170)
Saturation Ratios: 10 % — ABNORMAL LOW (ref 10.4–31.8)
TIBC: 361 ug/dL (ref 250–450)
UIBC: 326 ug/dL (ref 148–442)

## 2021-09-23 NOTE — Addendum Note (Signed)
Addended by: Eileen Stanford C on: 09/23/2021 01:14 PM   Modules accepted: Orders

## 2021-09-23 NOTE — Progress Notes (Signed)
Hematology and Oncology Follow Up Visit  Natalie Guerrero [id number] [birth year] 56 y.o. 09/23/2021   Principle Diagnosis:  Iron deficiency anemia secondary to malabsorption after gastric bypass (2007)   Current Therapy:        IV iron as indicated    Interim History:  Natalie Guerrero is here today to re-establish care for IDA. She is symptomatic with fatigue and has had some dizziness with 2 episodes of syncope. Thankfully she was not seriously injured.  She has not noted any blood loss. She states that recent stool test was negative for blood.  No fever, chills, n/v, cough, rash, chest pain, palpitations, abdominal pain or changes in bowel or bladder habits.   She notes mild SOB at times, especially when stressed.  No swelling, numbness or tingling in her extremities.  She has had weight loss since we last saw her and states that the excess skin on the upper part pf both legs is effecting her mobility as well as causing bruising and tenderness. She plans to speak with her PCP about this and see if she may be a candidate for some sort of surgical intervention.  Appetite is down. She is doing her best to stay well hydrated. Weight is 241 lbs.   ECOG Performance Status: 1 - Symptomatic but completely ambulatory  Medications:  Allergies as of 09/23/2021       Reactions   Codeine Itching        Medication List        Accurate as of September 23, 2021  9:27 AM. If you have any questions, ask your nurse or doctor.          Biotin 10 MG Caps Take 30 mg by mouth daily.   brimonidine 0.2 % ophthalmic solution Commonly known as: ALPHAGAN Place 1 drop into the right eye 2 (two) times daily.   buPROPion 150 MG 24 hr tablet Commonly known as: WELLBUTRIN XL TAKE 1 TABLET BY MOUTH EVERY DAY   cetirizine 10 MG tablet Commonly known as: ZYRTEC Take 10 mg by mouth daily.   cycloSPORINE 0.05 % ophthalmic emulsion Commonly known as: RESTASIS Place 1 drop into both eyes 2  (two) times daily.   diphenhydrAMINE 25 mg capsule Commonly known as: BENADRYL Take 50 mg by mouth 2 (two) times daily.   docusate sodium 100 MG capsule Commonly known as: COLACE Take 200 mg by mouth 2 (two) times daily.   FIBER PO Take 4 capsules by mouth daily. prn   ibuprofen 800 MG tablet Commonly known as: ADVIL Take 800 mg by mouth 3 (three) times daily as needed.   imipramine 25 MG tablet Commonly known as: TOFRANIL Take 25 mg by mouth 3 (three) times daily. 3 tabs 2 times per day   levothyroxine 100 MCG tablet Commonly known as: SYNTHROID TAKE ONE TABLET M-F AND TAKE 1/2 PILL ON SATURDAY/SUNDAY.   montelukast 10 MG tablet Commonly known as: SINGULAIR Take 1 tablet (10 mg total) by mouth daily.   Mucinex DM Maximum Strength 60-1200 MG Tb12 Take 1 tablet by mouth 2 (two) times daily.   naloxone 4 MG/0.1ML Liqd nasal spray kit Commonly known as: NARCAN See admin instructions.   Norel AD 4-10-325 MG Tabs Generic drug: Chlorphen-PE-Acetaminophen TAKE 1 TABLET BY MOUTH 2 (TWO) TIMES DAILY AS NEEDED.   omeprazole 40 MG capsule Commonly known as: PRILOSEC TAKE 1 CAPSULE BY MOUTH EVERY DAY   Oxycodone HCl 20 MG Tabs SMARTSIG:0.5-1 Tablet(s) By Mouth 4-6 Times Daily  tiZANidine 4 MG tablet Commonly known as: ZANAFLEX every 4 (four) hours as needed.   topiramate 100 MG tablet Commonly known as: TOPAMAX Take 100 mg by mouth 4 (four) times daily.   valACYclovir 500 MG tablet Commonly known as: VALTREX as needed.        Allergies:  Allergies  Allergen Reactions   Codeine Itching    Past Medical History, Surgical history, Social history, and Family History were reviewed and updated.  Review of Systems: All other 10 point review of systems is negative.   Physical Exam:  vitals were not taken for this visit.   Wt Readings from Last 3 Encounters:  07/22/21 250 lb 12.8 oz (113.8 kg)  05/01/21 257 lb (116.6 kg)  04/11/21 259 lb 12.8 oz (117.8 kg)     Ocular: Sclerae unicteric, pupils equal, round and reactive to light Ear-nose-throat: Oropharynx clear, dentition fair Lymphatic: No cervical or supraclavicular adenopathy Lungs no rales or rhonchi, good excursion bilaterally Heart regular rate and rhythm, no murmur appreciated Abd soft, nontender, positive bowel sounds MSK no focal spinal tenderness, no joint edema Neuro: non-focal, well-oriented, appropriate affect Breasts: Deferred   Lab Results  Component Value Date   WBC 6.6 07/22/2021   HGB 9.3 (L) 07/22/2021   HCT 29.6 (L) 07/22/2021   MCV 89 07/22/2021   PLT 397 07/22/2021   Lab Results  Component Value Date   FERRITIN 9 (L) 01/08/2021   IRON 55 01/08/2021   TIBC 315 01/08/2021   UIBC 260 01/08/2021   IRONPCTSAT 17 01/08/2021   Lab Results  Component Value Date   RETICCTPCT 1.9 11/02/2018   RBC 3.32 (L) 07/22/2021   RETICCTABS 76.62 12/08/2012   No results found for: "KPAFRELGTCHN", "LAMBDASER", "[facility]" Lab Results  Component Value Date   IGGSERUM 1,206 03/28/2021   IGMSERUM 181 03/28/2021   No results found for: "TOTALPROTELP", "ALBUMINELP", "A1GS", "A2GS", "BETS", "BETA2SER", "GAMS", "MSPIKE", "SPEI"   Chemistry      Component Value Date/Time   NA 141 07/22/2021 0926   NA 137 01/16/2017 1502   NA 141 11/10/2012 1050   K 4.2 07/22/2021 0926   K 3.7 01/16/2017 1502   K 3.8 11/10/2012 1050   CL 106 07/22/2021 0926   CL 98 01/16/2017 1502   CO2 20 07/22/2021 0926   CO2 25 01/16/2017 1502   CO2 28 11/10/2012 1050   BUN 8 07/22/2021 0926   BUN 14 01/16/2017 1502   BUN 7.5 11/10/2012 1050   CREATININE 0.95 07/22/2021 0926   CREATININE 1.1 01/16/2017 1502   CREATININE 0.8 11/10/2012 1050      Component Value Date/Time   CALCIUM 9.1 07/22/2021 0926   CALCIUM 8.8 01/16/2017 1502   CALCIUM 9.0 11/10/2012 1050   ALKPHOS 106 07/22/2021 0926   ALKPHOS 84 01/16/2017 1502   ALKPHOS 66 11/10/2012 1050   AST 19 07/22/2021 0926   AST 27  01/16/2017 1502   AST 34 11/10/2012 1050   ALT 15 07/22/2021 0926   ALT 23 01/16/2017 1502   ALT 30 11/10/2012 1050   BILITOT <0.2 07/22/2021 0926   BILITOT 0.60 01/16/2017 1502   BILITOT 0.57 11/10/2012 1050       Impression and Plan: Natalie Guerrero is a very pleasant 56 yo African American female with iron deficiency anemia secondary to malabsorption since having her gastric bypass surgery in 2007. Iron studies are pending. We will replace if needed.  Follow-up in 3 months.   Lottie Dawson, NP 8/14/20239:27 AM

## 2021-09-26 ENCOUNTER — Telehealth: Payer: Self-pay | Admitting: *Deleted

## 2021-09-26 NOTE — Telephone Encounter (Signed)
Per scheduling message Tiffany called patient and gave upcoming appointments (3) doses of IV Iron - confirmed

## 2021-09-28 DIAGNOSIS — R69 Illness, unspecified: Secondary | ICD-10-CM | POA: Diagnosis not present

## 2021-09-30 DIAGNOSIS — R69 Illness, unspecified: Secondary | ICD-10-CM | POA: Diagnosis not present

## 2021-10-01 DIAGNOSIS — M5136 Other intervertebral disc degeneration, lumbar region: Secondary | ICD-10-CM | POA: Diagnosis not present

## 2021-10-01 DIAGNOSIS — M9904 Segmental and somatic dysfunction of sacral region: Secondary | ICD-10-CM | POA: Diagnosis not present

## 2021-10-01 DIAGNOSIS — G894 Chronic pain syndrome: Secondary | ICD-10-CM | POA: Diagnosis not present

## 2021-10-01 DIAGNOSIS — M179 Osteoarthritis of knee, unspecified: Secondary | ICD-10-CM | POA: Diagnosis not present

## 2021-10-04 ENCOUNTER — Inpatient Hospital Stay: Payer: Medicare HMO

## 2021-10-04 VITALS — BP 128/84 | HR 80 | Temp 98.0°F

## 2021-10-04 DIAGNOSIS — K909 Intestinal malabsorption, unspecified: Secondary | ICD-10-CM

## 2021-10-04 DIAGNOSIS — D508 Other iron deficiency anemias: Secondary | ICD-10-CM

## 2021-10-04 DIAGNOSIS — Z9884 Bariatric surgery status: Secondary | ICD-10-CM | POA: Diagnosis not present

## 2021-10-04 DIAGNOSIS — K912 Postsurgical malabsorption, not elsewhere classified: Secondary | ICD-10-CM | POA: Diagnosis not present

## 2021-10-04 DIAGNOSIS — D509 Iron deficiency anemia, unspecified: Secondary | ICD-10-CM | POA: Diagnosis not present

## 2021-10-04 MED ORDER — SODIUM CHLORIDE 0.9% FLUSH: 10.0000 mL | Freq: Once | INTRAVENOUS | Status: DC | PRN

## 2021-10-04 MED ORDER — SODIUM CHLORIDE 0.9 % IV SOLN
300.0000 mg | Freq: Once | INTRAVENOUS | Status: AC
  Administered 2021-10-04: 300 mg via INTRAVENOUS
  Filled 2021-10-04: qty 300

## 2021-10-04 MED ORDER — SODIUM CHLORIDE 0.9 % IV SOLN: Freq: Once | INTRAVENOUS | Status: AC

## 2021-10-04 MED ORDER — SODIUM CHLORIDE 0.9% FLUSH: 3.0000 mL | Freq: Once | INTRAVENOUS | Status: DC | PRN

## 2021-10-04 NOTE — Patient Instructions (Signed)

## 2021-10-16 ENCOUNTER — Other Ambulatory Visit: Payer: Self-pay | Admitting: Internal Medicine

## 2021-10-16 DIAGNOSIS — J309 Allergic rhinitis, unspecified: Secondary | ICD-10-CM

## 2021-10-18 ENCOUNTER — Ambulatory Visit: Payer: Medicare HMO

## 2021-10-18 ENCOUNTER — Inpatient Hospital Stay: Payer: Medicare HMO | Attending: Hematology & Oncology

## 2021-10-18 VITALS — BP 138/78 | HR 72 | Temp 98.5°F | Resp 17

## 2021-10-18 DIAGNOSIS — D509 Iron deficiency anemia, unspecified: Secondary | ICD-10-CM | POA: Diagnosis not present

## 2021-10-18 DIAGNOSIS — Z9884 Bariatric surgery status: Secondary | ICD-10-CM | POA: Insufficient documentation

## 2021-10-18 DIAGNOSIS — K909 Intestinal malabsorption, unspecified: Secondary | ICD-10-CM

## 2021-10-18 DIAGNOSIS — D508 Other iron deficiency anemias: Secondary | ICD-10-CM

## 2021-10-18 DIAGNOSIS — K912 Postsurgical malabsorption, not elsewhere classified: Secondary | ICD-10-CM | POA: Insufficient documentation

## 2021-10-18 MED ORDER — SODIUM CHLORIDE 0.9 % IV SOLN
300.0000 mg | Freq: Once | INTRAVENOUS | Status: AC
  Administered 2021-10-18: 300 mg via INTRAVENOUS
  Filled 2021-10-18: qty 300

## 2021-10-18 MED ORDER — SODIUM CHLORIDE 0.9 % IV SOLN: Freq: Once | INTRAVENOUS | Status: AC

## 2021-10-18 NOTE — Patient Instructions (Signed)

## 2021-10-19 DIAGNOSIS — R69 Illness, unspecified: Secondary | ICD-10-CM | POA: Diagnosis not present

## 2021-10-23 DIAGNOSIS — M1711 Unilateral primary osteoarthritis, right knee: Secondary | ICD-10-CM | POA: Diagnosis not present

## 2021-10-23 DIAGNOSIS — M170 Bilateral primary osteoarthritis of knee: Secondary | ICD-10-CM | POA: Diagnosis not present

## 2021-10-23 DIAGNOSIS — M1712 Unilateral primary osteoarthritis, left knee: Secondary | ICD-10-CM | POA: Diagnosis not present

## 2021-10-25 ENCOUNTER — Ambulatory Visit: Payer: Medicare HMO

## 2021-10-25 ENCOUNTER — Inpatient Hospital Stay: Payer: Medicare HMO

## 2021-10-25 VITALS — BP 105/54 | HR 82 | Resp 17

## 2021-10-25 DIAGNOSIS — K909 Intestinal malabsorption, unspecified: Secondary | ICD-10-CM

## 2021-10-25 DIAGNOSIS — D508 Other iron deficiency anemias: Secondary | ICD-10-CM

## 2021-10-25 DIAGNOSIS — K912 Postsurgical malabsorption, not elsewhere classified: Secondary | ICD-10-CM | POA: Diagnosis not present

## 2021-10-25 DIAGNOSIS — Z9884 Bariatric surgery status: Secondary | ICD-10-CM | POA: Diagnosis not present

## 2021-10-25 DIAGNOSIS — D509 Iron deficiency anemia, unspecified: Secondary | ICD-10-CM | POA: Diagnosis not present

## 2021-10-25 MED ORDER — SODIUM CHLORIDE 0.9 % IV SOLN
300.0000 mg | Freq: Once | INTRAVENOUS | Status: AC
  Administered 2021-10-25: 300 mg via INTRAVENOUS
  Filled 2021-10-25: qty 300

## 2021-10-25 MED ORDER — SODIUM CHLORIDE 0.9 % IV SOLN: Freq: Once | INTRAVENOUS | Status: AC

## 2021-10-25 NOTE — Progress Notes (Signed)
Pt declined to stay for post infusion observation period. Pt stated she has tolerated medication multiple times prior without difficulty. Pt aware to call clinic with any questions or concerns. Pt verbalized understanding and had no further questions.  ? ?

## 2021-10-29 ENCOUNTER — Ambulatory Visit (INDEPENDENT_AMBULATORY_CARE_PROVIDER_SITE_OTHER): Payer: Medicare HMO

## 2021-10-29 VITALS — BP 108/60 | HR 83 | Temp 98.4°F | Ht 64.0 in | Wt 241.0 lb

## 2021-10-29 DIAGNOSIS — Z23 Encounter for immunization: Secondary | ICD-10-CM

## 2021-10-29 DIAGNOSIS — M5136 Other intervertebral disc degeneration, lumbar region: Secondary | ICD-10-CM | POA: Diagnosis not present

## 2021-10-29 DIAGNOSIS — M9904 Segmental and somatic dysfunction of sacral region: Secondary | ICD-10-CM | POA: Diagnosis not present

## 2021-10-29 DIAGNOSIS — G894 Chronic pain syndrome: Secondary | ICD-10-CM | POA: Diagnosis not present

## 2021-10-29 DIAGNOSIS — M179 Osteoarthritis of knee, unspecified: Secondary | ICD-10-CM | POA: Diagnosis not present

## 2021-10-29 NOTE — Progress Notes (Signed)
Patient presents today for 1st shingrix. Coverage obtained with [employer].  Form signed by pt.

## 2021-10-30 DIAGNOSIS — M1712 Unilateral primary osteoarthritis, left knee: Secondary | ICD-10-CM | POA: Diagnosis not present

## 2021-10-30 DIAGNOSIS — M1711 Unilateral primary osteoarthritis, right knee: Secondary | ICD-10-CM | POA: Diagnosis not present

## 2021-10-30 DIAGNOSIS — M170 Bilateral primary osteoarthritis of knee: Secondary | ICD-10-CM | POA: Diagnosis not present

## 2021-10-31 ENCOUNTER — Encounter: Payer: Self-pay | Admitting: Internal Medicine

## 2021-10-31 ENCOUNTER — Telehealth (INDEPENDENT_AMBULATORY_CARE_PROVIDER_SITE_OTHER): Payer: Medicare HMO | Admitting: Internal Medicine

## 2021-10-31 VITALS — BP 123/74 | HR 85

## 2021-10-31 DIAGNOSIS — E65 Localized adiposity: Secondary | ICD-10-CM

## 2021-10-31 DIAGNOSIS — L987 Excessive and redundant skin and subcutaneous tissue: Secondary | ICD-10-CM

## 2021-10-31 DIAGNOSIS — L304 Erythema intertrigo: Secondary | ICD-10-CM | POA: Diagnosis not present

## 2021-10-31 NOTE — Progress Notes (Signed)
VIDEO VISIT Visit type was conducted due to national recommendations for restrictions regarding the COVID-19 Pandemic (e.g. social distancing) in an effort to limit this patient's exposure and mitigate transmission in our community.  Due to her co-morbid illnesses, this patient is at least at moderate risk for complications without adequate follow up.  This format is felt to be most appropriate for this patient at this time.  All issues noted in this document were discussed and addressed.  A limited physical exam was performed with this format.    This visit type was conducted due to national recommendations for restrictions regarding the COVID-19 Pandemic (e.g. social distancing) in an effort to limit this patient's exposure and mitigate transmission in our community.  Patients identity confirmed using two different identifiers.  This format is felt to be most appropriate for this patient at this time.  All issues noted in this document were discussed and addressed.  No physical exam was performed (except for noted visual exam findings with Video Visits).    Date:  10/31/2021   ID:  Aaron, Boeh 08/04/1965, MRN [id number]  Patient Location:  Home  Provider location:   Office    Chief Complaint:  "I want referral to Plastic Surgery"  History of Present Illness:    Natalie Guerrero is a 56 y.o. female who presents via video conferencing for a telehealth visit today.    The patient does have symptoms concerning for COVID-19 infection (fever, chills, cough, or new shortness of breath).   She presents today for virtual visit. She prefers this method of contact due to COVID-19 pandemic. She would like referral for a thigh lift. She has been told in the past that this was necessary. She states due to weight loss, she has a lot of excess skin. She states she is now down to 238lbs. The excess skin in between her thighs causes a lot of chafing. She has a lot of itching  beneath the excess skin of her abdomen. She would like to have this removed as well.   She initially had gastric bypass surgery in 2007 and her lowest weight was 200 lbs. She is currently follow a healthy lifestyle - with low carb meals and regular exercising.        Past Medical History:  Diagnosis Date   Anxiety    Arthritis    DDD (degenerative disc disease)    Depression    Hypothyroidism 12/08/2012   Iron deficiency anemia, unspecified 11/10/2012   Iron malabsorption 01/20/2017   Obesity    Thyroid disease hypothroidism   Past Surgical History:  Procedure Laterality Date   ABDOMINAL HYSTERECTOMY  1999   BACK SURGERY     CESAREAN SECTION  1991   CHOLECYSTECTOMY N/A 12/22/2017   Procedure: LAPAROSCOPIC CHOLECYSTECTOMY;  Surgeon: Axel Filler, MD;  Location: [facility] OR;  Service: General;  Laterality: N/A;   GASTRIC BYPASS  2007   KNEE SURGERY Left 2000     Current Meds  Medication Sig   Biotin 10 MG CAPS Take 30 mg by mouth daily.    brimonidine (ALPHAGAN) 0.2 % ophthalmic solution Place 1 drop into the right eye 2 (two) times daily.   buPROPion (WELLBUTRIN XL) 150 MG 24 hr tablet TAKE 1 TABLET BY MOUTH EVERY DAY   cetirizine (ZYRTEC) 10 MG tablet Take 10 mg by mouth daily.   cycloSPORINE (RESTASIS) 0.05 % ophthalmic emulsion Place 1 drop into both eyes 2 (two) times daily.   Dextromethorphan-guaiFENesin [facility] DM MAXIMUM  STRENGTH) 60-1200 MG TB12 Take 1 tablet by mouth 2 (two) times daily.   docusate sodium (COLACE) 100 MG capsule Take 200 mg by mouth 2 (two) times daily.    FIBER PO Take 4 capsules by mouth daily. prn   ibuprofen (ADVIL) 800 MG tablet Take 800 mg by mouth 3 (three) times daily as needed.   imipramine (TOFRANIL) 25 MG tablet Take 25 mg by mouth 3 (three) times daily. 3 tabs 2 times per day   levothyroxine (SYNTHROID) 100 MCG tablet TAKE ONE TABLET M-F AND TAKE 1/2 PILL ON SATURDAY/SUNDAY.   montelukast (SINGULAIR) 10 MG tablet TAKE 1 TABLET BY MOUTH  EVERY DAY   naloxone (NARCAN) nasal spray 4 mg/0.1 mL See admin instructions.   NON FORMULARY Beet chews 2 a day   NOREL AD 4-10-325 MG TABS TAKE 1 TABLET BY MOUTH 2 (TWO) TIMES DAILY AS NEEDED.   omeprazole (PRILOSEC) 40 MG capsule TAKE 1 CAPSULE BY MOUTH EVERY DAY   Oxycodone HCl 20 MG TABS SMARTSIG:0.5-1 Tablet(s) By Mouth 4-6 Times Daily   Polysaccharide Iron Complex (IRON UP) 15 MG/0.5ML LIQD Take by mouth daily at 6 (six) AM.   tiZANidine (ZANAFLEX) 4 MG tablet every 4 (four) hours as needed.   topiramate (TOPAMAX) 100 MG tablet Take 100 mg by mouth 4 (four) times daily.    valACYclovir (VALTREX) 500 MG tablet as needed.   Current Facility-Administered Medications for the 10/31/21 encounter (Video Visit) with Glendale Chard, MD  Medication   triamcinolone acetonide (KENALOG) 10 MG/ML injection 20 mg     Allergies:   Codeine   Social History   Tobacco Use   Smoking status: Never   Smokeless tobacco: Never  Vaping Use   Vaping Use: Never used  Substance Use Topics   Alcohol use: Not Currently   Drug use: Yes    Types: Oxycodone     Family Hx: The patient's family history includes Heart failure in her mother; Prostate cancer in her father. There is no history of Colon cancer, Diabetes, Kidney disease, Liver disease, or Neuropathy.  ROS:   Please see the history of present illness.    Review of Systems  Constitutional: Negative.   Respiratory: Negative.    Cardiovascular: Negative.   Gastrointestinal: Negative.   Skin:  Positive for itching and rash.  Neurological: Negative.   Psychiatric/Behavioral: Negative.      All other systems reviewed and are negative.   Labs/Other Tests and Data Reviewed:    Recent Labs: 07/22/2021: ALT 15; BUN 8; Creatinine, Ser 0.95; Potassium 4.2; Sodium 141 09/12/2021: TSH 0.429 09/23/2021: Hemoglobin 9.0; Platelet Count 396   Recent Lipid Panel Lab Results  Component Value Date/Time   CHOL 162 07/22/2021 09:26 AM   TRIG 75  07/22/2021 09:26 AM   HDL 67 07/22/2021 09:26 AM   CHOLHDL 2.4 07/22/2021 09:26 AM   LDLCALC 81 07/22/2021 09:26 AM    Wt Readings from Last 3 Encounters:  10/29/21 241 lb (109.3 kg)  09/23/21 241 lb 1.3 oz (109.4 kg)  07/22/21 250 lb 12.8 oz (113.8 kg)     Exam:    Vital Signs:  BP 123/74   Pulse 85     Physical Exam Vitals and nursing note reviewed.  Constitutional:      Appearance: Normal appearance. She is obese.  HENT:     Head: Normocephalic and atraumatic.  Eyes:     Extraocular Movements: Extraocular movements intact.  Pulmonary:     Effort: Pulmonary effort is normal.  Abdominal:     Comments: Large abdominal pannus  Musculoskeletal:     Cervical back: Normal range of motion.  Skin:    Comments: EXCESS SKIN IN LOWER ABDOMEN AND B/L THIGHS HYPERPIGMENTATION ON INNER THIGHS B/L  Neurological:     Mental Status: She is alert and oriented to person, place, and time.  Psychiatric:        Mood and Affect: Affect normal.     ASSESSMENT & PLAN:    1. Pannus, abdominal Comments: I will refer her Plastic Surgery for panniculectomy consult.  - Ambulatory referral to Plastic Surgery  2. Chafing Comments: She will continue to wear proper undergarments to help decrease chafing in between her legs.  - Ambulatory referral to Plastic Surgery  3. Excess skin of thigh Comments: Sx are suggestive of lipedema. Treatment will likely require surgery. Rebounding and compression garments are likely helpful.  - Ambulatory referral to Plastic Surgery    COVID-19 Education: The signs and symptoms of COVID-19 were discussed with the patient and how to seek care for testing (follow up with PCP or arrange E-visit).  The importance of social distancing was discussed today.  Patient Risk:   After full review of this patients clinical status, I feel that they are at least moderate risk at this time.  Time:   Today, I have spent 16 minutes/ 30 seconds with the patient with  telehealth technology discussing above diagnoses.     Medication Adjustments/Labs and Tests Ordered: Current medicines are reviewed at length with the patient today.  Concerns regarding medicines are outlined above.   Tests Ordered: Orders Placed This Encounter  Procedures   Ambulatory referral to Plastic Surgery    Medication Changes: No orders of the defined types were placed in this encounter.   Disposition:  Follow up prn  Signed, [name], MD

## 2021-11-16 DIAGNOSIS — R69 Illness, unspecified: Secondary | ICD-10-CM | POA: Diagnosis not present

## 2021-11-18 DIAGNOSIS — M1712 Unilateral primary osteoarthritis, left knee: Secondary | ICD-10-CM | POA: Diagnosis not present

## 2021-11-18 DIAGNOSIS — M170 Bilateral primary osteoarthritis of knee: Secondary | ICD-10-CM | POA: Diagnosis not present

## 2021-11-18 DIAGNOSIS — M1711 Unilateral primary osteoarthritis, right knee: Secondary | ICD-10-CM | POA: Diagnosis not present

## 2021-11-26 DIAGNOSIS — M9904 Segmental and somatic dysfunction of sacral region: Secondary | ICD-10-CM | POA: Diagnosis not present

## 2021-11-26 DIAGNOSIS — M179 Osteoarthritis of knee, unspecified: Secondary | ICD-10-CM | POA: Diagnosis not present

## 2021-11-26 DIAGNOSIS — G894 Chronic pain syndrome: Secondary | ICD-10-CM | POA: Diagnosis not present

## 2021-11-26 DIAGNOSIS — M5136 Other intervertebral disc degeneration, lumbar region: Secondary | ICD-10-CM | POA: Diagnosis not present

## 2021-12-03 ENCOUNTER — Other Ambulatory Visit: Payer: Self-pay

## 2021-12-03 DIAGNOSIS — E66813 Obesity, class 3: Secondary | ICD-10-CM

## 2021-12-04 DIAGNOSIS — M533 Sacrococcygeal disorders, not elsewhere classified: Secondary | ICD-10-CM | POA: Diagnosis not present

## 2021-12-19 ENCOUNTER — Other Ambulatory Visit: Payer: Self-pay | Admitting: Internal Medicine

## 2021-12-24 ENCOUNTER — Inpatient Hospital Stay: Payer: Medicare HMO

## 2021-12-24 ENCOUNTER — Inpatient Hospital Stay: Payer: Medicare HMO | Attending: Hematology & Oncology

## 2021-12-24 ENCOUNTER — Encounter: Payer: Self-pay | Admitting: Medical Oncology

## 2021-12-24 ENCOUNTER — Ambulatory Visit: Payer: Medicare HMO | Admitting: Family

## 2021-12-24 ENCOUNTER — Other Ambulatory Visit: Payer: Self-pay

## 2021-12-24 ENCOUNTER — Inpatient Hospital Stay (HOSPITAL_BASED_OUTPATIENT_CLINIC_OR_DEPARTMENT_OTHER): Payer: Medicare HMO | Admitting: Medical Oncology

## 2021-12-24 VITALS — BP 116/70 | HR 88 | Temp 98.1°F | Resp 18 | Ht 64.0 in | Wt 238.8 lb

## 2021-12-24 DIAGNOSIS — K909 Intestinal malabsorption, unspecified: Secondary | ICD-10-CM | POA: Diagnosis not present

## 2021-12-24 DIAGNOSIS — G8929 Other chronic pain: Secondary | ICD-10-CM | POA: Diagnosis not present

## 2021-12-24 DIAGNOSIS — M9904 Segmental and somatic dysfunction of sacral region: Secondary | ICD-10-CM | POA: Diagnosis not present

## 2021-12-24 DIAGNOSIS — Z9884 Bariatric surgery status: Secondary | ICD-10-CM | POA: Diagnosis not present

## 2021-12-24 DIAGNOSIS — D508 Other iron deficiency anemias: Secondary | ICD-10-CM | POA: Diagnosis not present

## 2021-12-24 DIAGNOSIS — Z79891 Long term (current) use of opiate analgesic: Secondary | ICD-10-CM | POA: Diagnosis not present

## 2021-12-24 DIAGNOSIS — D509 Iron deficiency anemia, unspecified: Secondary | ICD-10-CM | POA: Insufficient documentation

## 2021-12-24 DIAGNOSIS — M179 Osteoarthritis of knee, unspecified: Secondary | ICD-10-CM | POA: Diagnosis not present

## 2021-12-24 DIAGNOSIS — R202 Paresthesia of skin: Secondary | ICD-10-CM | POA: Diagnosis not present

## 2021-12-24 DIAGNOSIS — M545 Low back pain, unspecified: Secondary | ICD-10-CM | POA: Diagnosis not present

## 2021-12-24 DIAGNOSIS — M5136 Other intervertebral disc degeneration, lumbar region: Secondary | ICD-10-CM | POA: Diagnosis not present

## 2021-12-24 LAB — CBC WITH DIFFERENTIAL (CANCER CENTER ONLY)
Abs Immature Granulocytes: 0.05 10*3/uL (ref 0.00–0.07)
Basophils Absolute: 0 10*3/uL (ref 0.0–0.1)
Basophils Relative: 1 %
Eosinophils Absolute: 0.1 10*3/uL (ref 0.0–0.5)
Eosinophils Relative: 2 %
HCT: 33.3 % — ABNORMAL LOW (ref 36.0–46.0)
Hemoglobin: 10.5 g/dL — ABNORMAL LOW (ref 12.0–15.0)
Immature Granulocytes: 1 %
Lymphocytes Relative: 49 %
Lymphs Abs: 3 10*3/uL (ref 0.7–4.0)
MCH: 31.7 pg (ref 26.0–34.0)
MCHC: 31.5 g/dL (ref 30.0–36.0)
MCV: 100.6 fL — ABNORMAL HIGH (ref 80.0–100.0)
Monocytes Absolute: 0.5 10*3/uL (ref 0.1–1.0)
Monocytes Relative: 7 %
Neutro Abs: 2.5 10*3/uL (ref 1.7–7.7)
Neutrophils Relative %: 40 %
Platelet Count: 366 10*3/uL (ref 150–400)
RBC: 3.31 MIL/uL — ABNORMAL LOW (ref 3.87–5.11)
RDW: 16 % — ABNORMAL HIGH (ref 11.5–15.5)
WBC Count: 6.2 10*3/uL (ref 4.0–10.5)
nRBC: 0 % (ref 0.0–0.2)

## 2021-12-24 LAB — RETICULOCYTES
Immature Retic Fract: 10.3 % (ref 2.3–15.9)
RBC.: 3.32 MIL/uL — ABNORMAL LOW (ref 3.87–5.11)
Retic Count, Absolute: 62.1 10*3/uL (ref 19.0–186.0)
Retic Ct Pct: 1.9 % (ref 0.4–3.1)

## 2021-12-24 LAB — FERRITIN: Ferritin: 51 ng/mL (ref 11–307)

## 2021-12-24 NOTE — Progress Notes (Signed)
Hematology and Oncology Follow Up Visit  Natalie Guerrero [id number] 1965-10-07 56 y.o. 12/24/2021   Principle Diagnosis:  Iron deficiency anemia secondary to malabsorption after gastric bypass (2007)   Current Therapy:        IV iron as indicated    Interim History:  [name] is here today for follow up for IDA. Symptoms have improved some since her last iron infusion. She tolerated this treatment well. Less fatigue, headaches. Only one syncopal episodes which is attributed to her anemia.   She has not noted any blood loss. Taking B12 oral supplement. Has not been taking her folic acid on a regular basis.   No fever, chills, n/v, cough, rash, chest pain, palpitations, abdominal pain or changes in bowel or bladder habits.   She notes mild SOB at times, especially when stressed.  No swelling, numbness or tingling in her extremities.   Appetite is down. She is doing her best to stay well hydrated.   Wt Readings from Last 3 Encounters:  12/24/21 238 lb 12.8 oz (108.3 kg)  10/29/21 241 lb (109.3 kg)  09/23/21 241 lb 1.3 oz (109.4 kg)   ECOG Performance Status: 1 - Symptomatic but completely ambulatory  Medications:  Allergies as of 12/24/2021       Reactions   Codeine Itching        Medication List        Accurate as of December 24, 2021  2:59 PM. If you have any questions, ask your nurse or doctor.          Biotin 10 MG Caps Take 30 mg by mouth daily.   brimonidine 0.2 % ophthalmic solution Commonly known as: ALPHAGAN Place 1 drop into the right eye 2 (two) times daily.   buPROPion 150 MG 24 hr tablet Commonly known as: WELLBUTRIN XL TAKE 1 TABLET BY MOUTH EVERY DAY   cetirizine 10 MG tablet Commonly known as: ZYRTEC Take 10 mg by mouth daily.   cycloSPORINE 0.05 % ophthalmic emulsion Commonly known as: RESTASIS Place 1 drop into both eyes 2 (two) times daily.   diphenhydrAMINE 25 mg capsule Commonly known as: BENADRYL Take 50 mg by  mouth 2 (two) times daily.   docusate sodium 100 MG capsule Commonly known as: COLACE Take 200 mg by mouth 2 (two) times daily.   FIBER PO Take 4 capsules by mouth daily. prn   ibuprofen 800 MG tablet Commonly known as: ADVIL Take 800 mg by mouth 3 (three) times daily as needed.   imipramine 25 MG tablet Commonly known as: TOFRANIL Take 25 mg by mouth 3 (three) times daily. 3 tabs 2 times per day   Iron Up 15 MG/0.5ML Liqd Generic drug: Polysaccharide Iron Complex Take by mouth daily at 6 (six) AM.   levothyroxine 100 MCG tablet Commonly known as: SYNTHROID TAKE ONE TABLET M-F AND TAKE 1/2 PILL ON SATURDAY/SUNDAY.   montelukast 10 MG tablet Commonly known as: SINGULAIR TAKE 1 TABLET BY MOUTH EVERY DAY   Mucinex DM Maximum Strength 60-1200 MG Tb12 Take 1 tablet by mouth 2 (two) times daily.   naloxone 4 MG/0.1ML Liqd nasal spray kit Commonly known as: NARCAN See admin instructions.   NON FORMULARY Beet chews 2 a day   Norel AD 4-10-325 MG Tabs Generic drug: Chlorphen-PE-Acetaminophen TAKE 1 TABLET BY MOUTH 2 (TWO) TIMES DAILY AS NEEDED.   omeprazole 40 MG capsule Commonly known as: PRILOSEC TAKE 1 CAPSULE BY MOUTH EVERY DAY   Oxycodone HCl 20 MG Tabs  SMARTSIG:0.5-1 Tablet(s) By Mouth 4-6 Times Daily   tiZANidine 4 MG tablet Commonly known as: ZANAFLEX every 4 (four) hours as needed.   topiramate 100 MG tablet Commonly known as: TOPAMAX Take 100 mg by mouth 4 (four) times daily.   valACYclovir 500 MG tablet Commonly known as: VALTREX as needed.        Allergies:  Allergies  Allergen Reactions   Codeine Itching    Past Medical History, Surgical history, Social history, and Family History were reviewed and updated.  Review of Systems: All other 10 point review of systems is negative.   Physical Exam:  vitals were not taken for this visit.   Wt Readings from Last 3 Encounters:  10/29/21 241 lb (109.3 kg)  09/23/21 241 lb 1.3 oz (109.4 kg)   07/22/21 250 lb 12.8 oz (113.8 kg)    Ocular: Sclerae with mild pallor, pupils equal, round and reactive to light Ear-nose-throat: Oropharynx clear, dentition fair Lymphatic: No cervical or supraclavicular adenopathy Lungs no rales or rhonchi, good excursion bilaterally Heart regular rate and rhythm, no murmur appreciated Abd soft, nontender, positive bowel sounds MSK no focal spinal tenderness, no joint edema Neuro: non-focal, well-oriented, appropriate affect  Lab Results  Component Value Date   WBC 6.2 12/24/2021   HGB 10.5 (L) 12/24/2021   HCT 33.3 (L) 12/24/2021   MCV 100.6 (H) 12/24/2021   PLT 366 12/24/2021   Lab Results  Component Value Date   FERRITIN 5 (L) 09/23/2021   IRON 35 09/23/2021   TIBC 361 09/23/2021   UIBC 326 09/23/2021   IRONPCTSAT 10 (L) 09/23/2021   Lab Results  Component Value Date   RETICCTPCT 1.9 12/24/2021   RBC 3.31 (L) 12/24/2021   RBC 3.32 (L) 12/24/2021   RETICCTABS 76.62 12/08/2012   No results found for: "KPAFRELGTCHN", "LAMBDASER", "[facility]" Lab Results  Component Value Date   IGGSERUM 1,206 03/28/2021   IGMSERUM 181 03/28/2021   No results found for: "TOTALPROTELP", "ALBUMINELP", "A1GS", "A2GS", "BETS", "BETA2SER", "GAMS", "MSPIKE", "SPEI"   Chemistry      Component Value Date/Time   NA 141 07/22/2021 0926   NA 137 01/16/2017 1502   NA 141 11/10/2012 1050   K 4.2 07/22/2021 0926   K 3.7 01/16/2017 1502   K 3.8 11/10/2012 1050   CL 106 07/22/2021 0926   CL 98 01/16/2017 1502   CO2 20 07/22/2021 0926   CO2 25 01/16/2017 1502   CO2 28 11/10/2012 1050   BUN 8 07/22/2021 0926   BUN 14 01/16/2017 1502   BUN 7.5 11/10/2012 1050   CREATININE 0.95 07/22/2021 0926   CREATININE 1.1 01/16/2017 1502   CREATININE 0.8 11/10/2012 1050      Component Value Date/Time   CALCIUM 9.1 07/22/2021 0926   CALCIUM 8.8 01/16/2017 1502   CALCIUM 9.0 11/10/2012 1050   ALKPHOS 106 07/22/2021 0926   ALKPHOS 84 01/16/2017 1502   ALKPHOS  66 11/10/2012 1050   AST 19 07/22/2021 0926   AST 27 01/16/2017 1502   AST 34 11/10/2012 1050   ALT 15 07/22/2021 0926   ALT 23 01/16/2017 1502   ALT 30 11/10/2012 1050   BILITOT <0.2 07/22/2021 0926   BILITOT 0.60 01/16/2017 1502   BILITOT 0.57 11/10/2012 1050      Encounter Diagnoses  Name Primary?   Iron malabsorption Yes   Iron deficiency anemia secondary to inadequate dietary iron intake     Impression and Plan: Ms. Leis is a very pleasant 56 yo African American  female with iron deficiency anemia secondary to malabsorption since having her gastric bypass surgery in 2007. Iron studies are currently pending. CBC shows an improved hgb of 10.5 with new slightly increased MCV of 100.6. She will restart her folic acid supplement. I would suspect that her ferritin is less than 50; if so I would recommend another round of IV Venofer. She is agreeable.   Disposition: Likely needs Venofer weekly x 5 weeks - labs pending RTC 3 months APP, labs (CBC w/, B12/folate, CMP, Iron/TIBC, ferritin, reticulocytes)  Hughie Closs, PA-C 11/14/20232:59 PM

## 2021-12-25 LAB — IRON AND IRON BINDING CAPACITY (CC-WL,HP ONLY)
Iron: 47 ug/dL (ref 28–170)
Saturation Ratios: 17 % (ref 10.4–31.8)
TIBC: 280 ug/dL (ref 250–450)
UIBC: 233 ug/dL (ref 148–442)

## 2022-01-04 ENCOUNTER — Other Ambulatory Visit: Payer: Self-pay | Admitting: Internal Medicine

## 2022-01-09 DIAGNOSIS — Z5181 Encounter for therapeutic drug level monitoring: Secondary | ICD-10-CM | POA: Diagnosis not present

## 2022-01-15 DIAGNOSIS — G8929 Other chronic pain: Secondary | ICD-10-CM | POA: Diagnosis not present

## 2022-01-15 DIAGNOSIS — R202 Paresthesia of skin: Secondary | ICD-10-CM | POA: Diagnosis not present

## 2022-01-15 DIAGNOSIS — M179 Osteoarthritis of knee, unspecified: Secondary | ICD-10-CM | POA: Diagnosis not present

## 2022-01-15 DIAGNOSIS — M62830 Muscle spasm of back: Secondary | ICD-10-CM | POA: Diagnosis not present

## 2022-01-16 ENCOUNTER — Other Ambulatory Visit: Payer: Self-pay | Admitting: Internal Medicine

## 2022-01-21 ENCOUNTER — Ambulatory Visit (INDEPENDENT_AMBULATORY_CARE_PROVIDER_SITE_OTHER): Payer: Medicare HMO | Admitting: Internal Medicine

## 2022-01-21 ENCOUNTER — Encounter: Payer: Self-pay | Admitting: Internal Medicine

## 2022-01-21 VITALS — BP 122/80 | HR 87 | Temp 98.3°F | Ht 64.0 in | Wt 240.8 lb

## 2022-01-21 DIAGNOSIS — R600 Localized edema: Secondary | ICD-10-CM

## 2022-01-21 DIAGNOSIS — Z6841 Body Mass Index (BMI) 40.0 and over, adult: Secondary | ICD-10-CM | POA: Diagnosis not present

## 2022-01-21 DIAGNOSIS — R69 Illness, unspecified: Secondary | ICD-10-CM | POA: Diagnosis not present

## 2022-01-21 DIAGNOSIS — E039 Hypothyroidism, unspecified: Secondary | ICD-10-CM | POA: Diagnosis not present

## 2022-01-21 DIAGNOSIS — L987 Excessive and redundant skin and subcutaneous tissue: Secondary | ICD-10-CM

## 2022-01-21 DIAGNOSIS — Z23 Encounter for immunization: Secondary | ICD-10-CM

## 2022-01-21 DIAGNOSIS — F331 Major depressive disorder, recurrent, moderate: Secondary | ICD-10-CM

## 2022-01-21 NOTE — Progress Notes (Signed)
Jeri Cos Llittleton,acting as a Neurosurgeon for [name], MD.,have documented all relevant documentation on the behalf of [name], MD,as directed by  [name], MD while in the presence of [name], MD.    Subjective:     Patient ID: Natalie Guerrero , female    DOB: December 31, 1965 , 56 y.o.   MRN: [id number]   Chief Complaint  Patient presents with   Hypothyroidism    HPI  The patient is here today for a thyroid follow-up.  She reports compliance with levothyroxine supplementation. She has not had any issues with the medication.   Thyroid Problem Presents for follow-up visit. Patient reports no diarrhea, dry skin, hoarse voice, menstrual problem or palpitations.     Past Medical History:  Diagnosis Date   Anxiety    Arthritis    DDD (degenerative disc disease)    Depression    Hypothyroidism 12/08/2012   Iron deficiency anemia, unspecified 11/10/2012   Iron malabsorption 01/20/2017   Obesity    Thyroid disease hypothroidism     Family History  Problem Relation Age of Onset   Heart failure Mother    Prostate cancer Father    Colon cancer Neg Hx    Diabetes Neg Hx    Kidney disease Neg Hx    Liver disease Neg Hx    Neuropathy Neg Hx      Current Outpatient Medications:    Biotin 10 MG CAPS, Take 30 mg by mouth daily. , Disp: , Rfl:    brimonidine (ALPHAGAN) 0.2 % ophthalmic solution, Place 1 drop into the right eye 2 (two) times daily., Disp: , Rfl:    buPROPion (WELLBUTRIN XL) 150 MG 24 hr tablet, TAKE 1 TABLET BY MOUTH EVERY DAY, Disp: 90 tablet, Rfl: 1   cetirizine (ZYRTEC) 10 MG tablet, Take 10 mg by mouth daily., Disp: , Rfl:    Cholecalciferol (VITAMIN D3) 50 MCG (2000 UT) capsule, Take 2,000 Units by mouth daily., Disp: , Rfl:    cycloSPORINE (RESTASIS) 0.05 % ophthalmic emulsion, Place 1 drop into both eyes 2 (two) times daily., Disp: , Rfl:    Dextromethorphan-guaiFENesin (MUCINEX DM MAXIMUM STRENGTH) 60-1200 MG TB12, Take 1  tablet by mouth 2 (two) times daily., Disp: , Rfl:    docusate sodium (COLACE) 100 MG capsule, Take 200 mg by mouth 2 (two) times daily. , Disp: , Rfl:    FIBER PO, Take 4 capsules by mouth daily. prn, Disp: , Rfl:    folic acid (FOLVITE) 400 MCG tablet, Take 400 mcg by mouth daily., Disp: , Rfl:    imipramine (TOFRANIL) 25 MG tablet, Take 25 mg by mouth 3 (three) times daily. 3 tabs 2 times per day, Disp: , Rfl: 4   levothyroxine (SYNTHROID) 100 MCG tablet, TAKE ONE TABLET M-F AND TAKE 1/2 PILL ON SATURDAY/SUNDAY. (Patient taking differently: Take 100 mcg by mouth. take one tablet M-F and skip Saturday and sunday), Disp: 90 tablet, Rfl: 0   montelukast (SINGULAIR) 10 MG tablet, TAKE 1 TABLET BY MOUTH EVERY DAY, Disp: 90 tablet, Rfl: 1   naloxone (NARCAN) nasal spray 4 mg/0.1 mL, See admin instructions., Disp: , Rfl:    naproxen sodium (ALEVE) 220 MG tablet, Take 220 mg by mouth. Take 2 tabs bid, Disp: , Rfl:    NON FORMULARY, Beet chews 2 a day, Disp: , Rfl:    NOREL AD 4-10-325 MG TABS, TAKE 1 TABLET BY MOUTH 2 (TWO) TIMES DAILY AS NEEDED., Disp: 20  tablet, Rfl: 0   omeprazole (PRILOSEC) 40 MG capsule, TAKE 1 CAPSULE BY MOUTH EVERY DAY, Disp: 90 capsule, Rfl: 1   Oxycodone HCl 20 MG TABS, SMARTSIG:0.5-1 Tablet(s) By Mouth 4-6 Times Daily, Disp: , Rfl:    tiZANidine (ZANAFLEX) 4 MG tablet, every 4 (four) hours as needed., Disp: , Rfl:    topiramate (TOPAMAX) 100 MG tablet, Take 100 mg by mouth 4 (four) times daily. , Disp: , Rfl: 5   UNABLE TO FIND, Med Name: Beet Chews take 2 chews daily, Disp: , Rfl:    valACYclovir (VALTREX) 500 MG tablet, as needed., Disp: , Rfl:   Current Facility-Administered Medications:    triamcinolone acetonide (KENALOG) 10 MG/ML injection 20 mg, 20 mg, Other, Once, Regal, Kirstie Peri, DPM   Allergies  Allergen Reactions   Codeine Itching     Review of Systems  Constitutional: Negative.   HENT:  Negative for hoarse voice.   Eyes: Negative.   Cardiovascular:   Negative for palpitations.  Gastrointestinal:  Negative for diarrhea.  Genitourinary:  Negative for menstrual problem.  Musculoskeletal: Negative.   Skin: Negative.   Psychiatric/Behavioral: Negative.       Today's Vitals   01/21/22 0941  BP: 122/80  Pulse: 87  Temp: 98.3 F (36.8 C)  Weight: 240 lb 12.8 oz (109.2 kg)  Height: 5\' 4"  (1.626 m)  PainSc: 0-No pain   Body mass index is 41.33 kg/m.  Wt Readings from Last 3 Encounters:  01/21/22 240 lb 12.8 oz (109.2 kg)  12/24/21 238 lb 12.8 oz (108.3 kg)  10/29/21 241 lb (109.3 kg)     Objective:  Physical Exam Vitals and nursing note reviewed.  Constitutional:      Appearance: Normal appearance. She is obese.  HENT:     Head: Normocephalic and atraumatic.     Nose:     Comments: Masked     Mouth/Throat:     Comments: Masked  Eyes:     Extraocular Movements: Extraocular movements intact.  Cardiovascular:     Rate and Rhythm: Normal rate and regular rhythm.     Heart sounds: Normal heart sounds.  Pulmonary:     Effort: Pulmonary effort is normal.     Breath sounds: Normal breath sounds.  Musculoskeletal:     Cervical back: Normal range of motion.  Skin:    General: Skin is warm.  Neurological:     General: No focal deficit present.     Mental Status: She is alert.  Psychiatric:        Mood and Affect: Mood normal.        Behavior: Behavior normal.   c      Assessment And Plan:     1. Primary hypothyroidism Comments: Chronic, I will check thyroid panel and adjust meds as needed.  She will f/u in 4-6 months for re-evaluation. - TSH - T4, Free  2. Excess skin of thigh Comments: She has been referred to [facility]; however, she must lose 20lbs prior to having a consultation.  3. Lipedema of lower extremity Comments: She agrees to eval at Lipedema clinic. She would benefit from compression hose as well as rebounding. - Ambulatory referral to Vascular Surgery  4. Moderate episode of recurrent major  depressive disorder (HCC) Comments: Chronic, currently stable on bupriopion XL and imipramine.  5. Class 3 severe obesity due to excess calories with serious comorbidity and body mass index (BMI) of 40.0 to 44.9 in adult [facility]) Comments: BMI 41. She is encouraged  to aim for at least 150 minutes of exercise/week, while initially striving for BMI<39 to decrease cardiac risk.  6. Immunization due - Varicella-zoster vaccine IM   Patient was given opportunity to ask questions. Patient verbalized understanding of the plan and was able to repeat key elements of the plan. All questions were answered to their satisfaction.   I, [name], MD, have reviewed all documentation for this visit. The documentation on 01/21/22 for the exam, diagnosis, procedures, and orders are all accurate and complete.   IF YOU HAVE BEEN REFERRED TO A SPECIALIST, IT MAY TAKE 1-2 WEEKS TO SCHEDULE/PROCESS THE REFERRAL. IF YOU HAVE NOT HEARD FROM US/SPECIALIST IN TWO WEEKS, PLEASE GIVE [place] A CALL AT [phone] X [phone].   THE PATIENT IS ENCOURAGED TO PRACTICE SOCIAL DISTANCING DUE TO THE COVID-19 PANDEMIC.

## 2022-01-21 NOTE — Patient Instructions (Signed)

## 2022-01-22 LAB — TSH: TSH: 0.509 u[IU]/mL (ref 0.450–4.500)

## 2022-01-22 LAB — T4, FREE: Free T4: 1.34 ng/dL (ref 0.82–1.77)

## 2022-01-29 DIAGNOSIS — I890 Lymphedema, not elsewhere classified: Secondary | ICD-10-CM | POA: Diagnosis not present

## 2022-01-29 DIAGNOSIS — M79604 Pain in right leg: Secondary | ICD-10-CM | POA: Diagnosis not present

## 2022-01-29 DIAGNOSIS — M79605 Pain in left leg: Secondary | ICD-10-CM | POA: Diagnosis not present

## 2022-02-17 DIAGNOSIS — R202 Paresthesia of skin: Secondary | ICD-10-CM | POA: Diagnosis not present

## 2022-02-17 DIAGNOSIS — M179 Osteoarthritis of knee, unspecified: Secondary | ICD-10-CM | POA: Diagnosis not present

## 2022-02-17 DIAGNOSIS — M5136 Other intervertebral disc degeneration, lumbar region: Secondary | ICD-10-CM | POA: Diagnosis not present

## 2022-02-17 DIAGNOSIS — G8929 Other chronic pain: Secondary | ICD-10-CM | POA: Diagnosis not present

## 2022-02-18 ENCOUNTER — Ambulatory Visit: Payer: Medicare HMO

## 2022-02-20 DIAGNOSIS — I890 Lymphedema, not elsewhere classified: Secondary | ICD-10-CM | POA: Diagnosis not present

## 2022-02-25 ENCOUNTER — Ambulatory Visit (INDEPENDENT_AMBULATORY_CARE_PROVIDER_SITE_OTHER): Payer: Medicare HMO

## 2022-02-25 VITALS — BP 128/72 | HR 92 | Temp 98.4°F

## 2022-02-25 DIAGNOSIS — Z23 Encounter for immunization: Secondary | ICD-10-CM

## 2022-02-25 NOTE — Progress Notes (Signed)
Patient presents today for flu shot.  

## 2022-03-07 DIAGNOSIS — M1711 Unilateral primary osteoarthritis, right knee: Secondary | ICD-10-CM | POA: Diagnosis not present

## 2022-03-07 DIAGNOSIS — M170 Bilateral primary osteoarthritis of knee: Secondary | ICD-10-CM | POA: Diagnosis not present

## 2022-03-07 DIAGNOSIS — M1712 Unilateral primary osteoarthritis, left knee: Secondary | ICD-10-CM | POA: Diagnosis not present

## 2022-03-11 ENCOUNTER — Encounter: Payer: Self-pay | Admitting: Internal Medicine

## 2022-03-11 ENCOUNTER — Ambulatory Visit (INDEPENDENT_AMBULATORY_CARE_PROVIDER_SITE_OTHER): Payer: Medicare HMO | Admitting: Internal Medicine

## 2022-03-11 ENCOUNTER — Other Ambulatory Visit: Payer: Self-pay

## 2022-03-11 VITALS — BP 108/74 | HR 77 | Temp 98.1°F | Ht 64.0 in | Wt 240.0 lb

## 2022-03-11 DIAGNOSIS — R051 Acute cough: Secondary | ICD-10-CM | POA: Diagnosis not present

## 2022-03-11 DIAGNOSIS — J0100 Acute maxillary sinusitis, unspecified: Secondary | ICD-10-CM | POA: Diagnosis not present

## 2022-03-11 LAB — POC INFLUENZA A&B (BINAX/QUICKVUE)
Influenza A, POC: NEGATIVE
Influenza B, POC: NEGATIVE

## 2022-03-11 LAB — POC COVID19 BINAXNOW: SARS Coronavirus 2 Ag: NEGATIVE

## 2022-03-11 MED ORDER — BENZONATATE 100 MG PO CAPS: 100.0000 mg | ORAL_CAPSULE | Freq: Three times a day (TID) | ORAL | 1 refills | Status: DC | PRN

## 2022-03-11 MED ORDER — AMOXICILLIN-POT CLAVULANATE 875-125 MG PO TABS: 1.0000 | ORAL_TABLET | Freq: Two times a day (BID) | ORAL | 0 refills | Status: DC

## 2022-03-11 NOTE — Progress Notes (Signed)
[name],acting as a Education administrator for [name], MD.,have documented all relevant documentation on the behalf of [name], MD,as directed by  [name], MD while in the presence of [name], MD.    Subjective:     Patient ID: Natalie Guerrero , female    DOB: [birth year] , 57 y.o.   MRN: [id number]   Chief Complaint  Patient presents with   URI    HPI  Patient presents today for a cough, low grade fever and chills that started a week ago. No relief with OTC meds including Mucinex. Over the past few days, the congestion has worsened. She is also taking a pink decongestant. Denies ill contacts.   URI  This is a new problem. The current episode started in the past 7 days. The problem has been unchanged. The maximum temperature recorded prior to her arrival was 100.4 - 100.9 F. Associated symptoms include congestion, coughing and headaches. She has tried decongestant for the symptoms. The treatment provided mild relief.     Past Medical History:  Diagnosis Date   Anxiety    Arthritis    DDD (degenerative disc disease)    Depression    Hypothyroidism 12/08/2012   Iron deficiency anemia, unspecified 11/10/2012   Iron malabsorption 01/20/2017   Obesity    Thyroid disease hypothroidism     Family History  Problem Relation Age of Onset   Heart failure Mother    Prostate cancer Father    Colon cancer Neg Hx    Diabetes Neg Hx    Kidney disease Neg Hx    Liver disease Neg Hx    Neuropathy Neg Hx      Current Outpatient Medications:    benzonatate (TESSALON PERLES) 100 MG capsule, Take 1 capsule (100 mg total) by mouth 3 (three) times daily as needed for cough., Disp: 30 capsule, Rfl: 1   Biotin 10 MG CAPS, Take 30 mg by mouth daily. , Disp: , Rfl:    brimonidine (ALPHAGAN) 0.2 % ophthalmic solution, Place 1 drop into the right eye 2 (two) times daily., Disp: , Rfl:    buPROPion (WELLBUTRIN XL) 150 MG 24 hr tablet, TAKE 1 TABLET BY MOUTH  EVERY DAY, Disp: 90 tablet, Rfl: 1   cetirizine (ZYRTEC) 10 MG tablet, Take 10 mg by mouth daily., Disp: , Rfl:    Cholecalciferol (VITAMIN D3) 50 MCG (2000 UT) capsule, Take 2,000 Units by mouth daily., Disp: , Rfl:    cycloSPORINE (RESTASIS) 0.05 % ophthalmic emulsion, Place 1 drop into both eyes 2 (two) times daily., Disp: , Rfl:    Dextromethorphan-guaiFENesin (MUCINEX DM MAXIMUM STRENGTH) 60-1200 MG TB12, Take 1 tablet by mouth 2 (two) times daily., Disp: , Rfl:    docusate sodium (COLACE) 100 MG capsule, Take 200 mg by mouth 2 (two) times daily. , Disp: , Rfl:    FIBER PO, Take 4 capsules by mouth daily. prn, Disp: , Rfl:    folic acid (FOLVITE) 195 MCG tablet, Take 400 mcg by mouth daily., Disp: , Rfl:    imipramine (TOFRANIL) 25 MG tablet, Take 25 mg by mouth 3 (three) times daily. 3 tabs 2 times per day, Disp: , Rfl: 4   levothyroxine (SYNTHROID) 100 MCG tablet, TAKE ONE TABLET M-F AND TAKE 1/2 PILL ON SATURDAY/SUNDAY. (Patient taking differently: Take 100 mcg by mouth. take one tablet M-F and skip Saturday and sunday), Disp: 90 tablet, Rfl: 0   montelukast (SINGULAIR) 10 MG tablet, TAKE  1 TABLET BY MOUTH EVERY DAY, Disp: 90 tablet, Rfl: 1   naloxone (NARCAN) nasal spray 4 mg/0.1 mL, See admin instructions., Disp: , Rfl:    naproxen sodium (ALEVE) 220 MG tablet, Take 220 mg by mouth. Take 2 tabs bid, Disp: , Rfl:    NON FORMULARY, Beet chews 2 a day, Disp: , Rfl:    NOREL AD 4-10-325 MG TABS, TAKE 1 TABLET BY MOUTH 2 (TWO) TIMES DAILY AS NEEDED., Disp: 20 tablet, Rfl: 0   omeprazole (PRILOSEC) 40 MG capsule, TAKE 1 CAPSULE BY MOUTH EVERY DAY, Disp: 90 capsule, Rfl: 1   Oxycodone HCl 20 MG TABS, SMARTSIG:0.5-1 Tablet(s) By Mouth 4-6 Times Daily, Disp: , Rfl:    tiZANidine (ZANAFLEX) 4 MG tablet, every 4 (four) hours as needed., Disp: , Rfl:    topiramate (TOPAMAX) 100 MG tablet, Take 100 mg by mouth 4 (four) times daily. , Disp: , Rfl: 5   UNABLE TO FIND, Med Name: Beet Chews take 2 chews  daily, Disp: , Rfl:    valACYclovir (VALTREX) 500 MG tablet, as needed., Disp: , Rfl:    amoxicillin-clavulanate (AUGMENTIN) 875-125 MG tablet, Take 1 tablet by mouth 2 (two) times daily., Disp: 20 tablet, Rfl: 0  Current Facility-Administered Medications:    triamcinolone acetonide (KENALOG) 10 MG/ML injection 20 mg, 20 mg, Other, Once, Regal, [name], DPM   Allergies  Allergen Reactions   Codeine Itching     Review of Systems  Constitutional:  Positive for fatigue.  HENT:  Positive for congestion.   Respiratory:  Positive for cough.   Cardiovascular: Negative.   Gastrointestinal: Negative.   Neurological:  Positive for headaches.  Psychiatric/Behavioral: Negative.       Today's Vitals   03/11/22 0916  BP: 108/74  Pulse: 77  Temp: 98.1 F (36.7 C)  SpO2: 98%  Weight: 240 lb (108.9 kg)  Height: 5\' 4"  (1.626 m)   Body mass index is 41.2 kg/m.   Objective:  Physical Exam Vitals and nursing note reviewed.  Constitutional:      Appearance: Normal appearance.  HENT:     Head: Normocephalic and atraumatic.     Right Ear: Tympanic membrane, ear canal and external ear normal. There is no impacted cerumen.     Left Ear: Tympanic membrane, ear canal and external ear normal. There is no impacted cerumen.  Eyes:     Extraocular Movements: Extraocular movements intact.  Cardiovascular:     Rate and Rhythm: Normal rate and regular rhythm.     Heart sounds: Normal heart sounds.  Pulmonary:     Effort: Pulmonary effort is normal.     Breath sounds: Normal breath sounds.  Musculoskeletal:     Cervical back: Normal range of motion.  Skin:    General: Skin is warm.  Neurological:     General: No focal deficit present.     Mental Status: She is alert.  Psychiatric:        Mood and Affect: Mood normal.        Behavior: Behavior normal.      Assessment And Plan:     1. Acute non-recurrent maxillary sinusitis Comments: I will send abx as listed below. She is encouraged to  take full course of abx. She should avoid dairy for the next 7-10 days.  2. Acute cough Comments: Rapid COVID negative. I will send off resp panel, she is in agreement with treatment plan. - Respiratory Panel w/ SARS-CoV2 - POC Influenza A&B(BINAX/QUICKVUE) - POC COVID-19  Patient was given opportunity to ask questions. Patient verbalized understanding of the plan and was able to repeat key elements of the plan. All questions were answered to their satisfaction.   I, [name], MD, have reviewed all documentation for this visit. The documentation on 03/11/22 for the exam, diagnosis, procedures, and orders are all accurate and complete.   IF YOU HAVE BEEN REFERRED TO A SPECIALIST, IT MAY TAKE 1-2 WEEKS TO SCHEDULE/PROCESS THE REFERRAL. IF YOU HAVE NOT HEARD FROM US/SPECIALIST IN TWO WEEKS, PLEASE GIVE [place] A CALL AT [phone].   THE PATIENT IS ENCOURAGED TO PRACTICE SOCIAL DISTANCING DUE TO THE COVID-19 PANDEMIC.

## 2022-03-13 LAB — RESPIRATORY PANEL W/ SARS-COV2

## 2022-03-16 ENCOUNTER — Other Ambulatory Visit: Payer: Self-pay | Admitting: Internal Medicine

## 2022-03-18 DIAGNOSIS — M179 Osteoarthritis of knee, unspecified: Secondary | ICD-10-CM | POA: Diagnosis not present

## 2022-03-18 DIAGNOSIS — G894 Chronic pain syndrome: Secondary | ICD-10-CM | POA: Diagnosis not present

## 2022-03-18 DIAGNOSIS — Z5181 Encounter for therapeutic drug level monitoring: Secondary | ICD-10-CM | POA: Diagnosis not present

## 2022-03-18 DIAGNOSIS — E039 Hypothyroidism, unspecified: Secondary | ICD-10-CM | POA: Diagnosis not present

## 2022-03-18 DIAGNOSIS — Z79891 Long term (current) use of opiate analgesic: Secondary | ICD-10-CM | POA: Diagnosis not present

## 2022-03-18 DIAGNOSIS — G8929 Other chronic pain: Secondary | ICD-10-CM | POA: Diagnosis not present

## 2022-03-18 DIAGNOSIS — R202 Paresthesia of skin: Secondary | ICD-10-CM | POA: Diagnosis not present

## 2022-03-18 DIAGNOSIS — M5430 Sciatica, unspecified side: Secondary | ICD-10-CM | POA: Diagnosis not present

## 2022-03-21 ENCOUNTER — Other Ambulatory Visit: Payer: Self-pay | Admitting: Internal Medicine

## 2022-03-21 DIAGNOSIS — J309 Allergic rhinitis, unspecified: Secondary | ICD-10-CM

## 2022-03-23 DIAGNOSIS — I890 Lymphedema, not elsewhere classified: Secondary | ICD-10-CM | POA: Diagnosis not present

## 2022-03-27 ENCOUNTER — Encounter (INDEPENDENT_AMBULATORY_CARE_PROVIDER_SITE_OTHER): Payer: Self-pay

## 2022-03-27 ENCOUNTER — Encounter (INDEPENDENT_AMBULATORY_CARE_PROVIDER_SITE_OTHER): Payer: Medicare HMO | Admitting: Family Medicine

## 2022-04-03 ENCOUNTER — Inpatient Hospital Stay: Payer: Medicare HMO | Attending: Hematology & Oncology

## 2022-04-03 ENCOUNTER — Encounter: Payer: Self-pay | Admitting: Medical Oncology

## 2022-04-03 ENCOUNTER — Inpatient Hospital Stay: Payer: Medicare HMO | Admitting: Medical Oncology

## 2022-04-03 VITALS — BP 108/69 | HR 84 | Temp 98.3°F | Resp 17 | Wt 235.0 lb

## 2022-04-03 DIAGNOSIS — D509 Iron deficiency anemia, unspecified: Secondary | ICD-10-CM | POA: Diagnosis not present

## 2022-04-03 DIAGNOSIS — D7589 Other specified diseases of blood and blood-forming organs: Secondary | ICD-10-CM

## 2022-04-03 DIAGNOSIS — D508 Other iron deficiency anemias: Secondary | ICD-10-CM

## 2022-04-03 DIAGNOSIS — K912 Postsurgical malabsorption, not elsewhere classified: Secondary | ICD-10-CM | POA: Insufficient documentation

## 2022-04-03 DIAGNOSIS — Z9884 Bariatric surgery status: Secondary | ICD-10-CM | POA: Insufficient documentation

## 2022-04-03 DIAGNOSIS — D709 Neutropenia, unspecified: Secondary | ICD-10-CM | POA: Diagnosis not present

## 2022-04-03 LAB — FERRITIN: Ferritin: 21 ng/mL (ref 11–307)

## 2022-04-03 LAB — CBC WITH DIFFERENTIAL (CANCER CENTER ONLY)
Abs Immature Granulocytes: 0.04 10*3/uL (ref 0.00–0.07)
Basophils Absolute: 0 10*3/uL (ref 0.0–0.1)
Basophils Relative: 1 %
Eosinophils Absolute: 0.2 10*3/uL (ref 0.0–0.5)
Eosinophils Relative: 5 %
HCT: 33.7 % — ABNORMAL LOW (ref 36.0–46.0)
Hemoglobin: 10.5 g/dL — ABNORMAL LOW (ref 12.0–15.0)
Immature Granulocytes: 1 %
Lymphocytes Relative: 52 %
Lymphs Abs: 1.9 10*3/uL (ref 0.7–4.0)
MCH: 32.1 pg (ref 26.0–34.0)
MCHC: 31.2 g/dL (ref 30.0–36.0)
MCV: 103.1 fL — ABNORMAL HIGH (ref 80.0–100.0)
Monocytes Absolute: 0.3 10*3/uL (ref 0.1–1.0)
Monocytes Relative: 10 %
Neutro Abs: 1.1 10*3/uL — ABNORMAL LOW (ref 1.7–7.7)
Neutrophils Relative %: 31 %
Platelet Count: 326 10*3/uL (ref 150–400)
RBC: 3.27 MIL/uL — ABNORMAL LOW (ref 3.87–5.11)
RDW: 14.4 % (ref 11.5–15.5)
WBC Count: 3.6 10*3/uL — ABNORMAL LOW (ref 4.0–10.5)
nRBC: 0 % (ref 0.0–0.2)

## 2022-04-03 LAB — RETICULOCYTES
Immature Retic Fract: 12.6 % (ref 2.3–15.9)
RBC.: 3.34 MIL/uL — ABNORMAL LOW (ref 3.87–5.11)
Retic Count, Absolute: 44.4 10*3/uL (ref 19.0–186.0)
Retic Ct Pct: 1.3 % (ref 0.4–3.1)

## 2022-04-03 LAB — IRON AND IRON BINDING CAPACITY (CC-WL,HP ONLY)
Iron: 65 ug/dL (ref 28–170)
Saturation Ratios: 24 % (ref 10.4–31.8)
TIBC: 273 ug/dL (ref 250–450)
UIBC: 208 ug/dL (ref 148–442)

## 2022-04-03 NOTE — Progress Notes (Signed)
Hematology and Oncology Follow Up Visit  Natalie Guerrero DH:[id number] 07-Jun-1965 57 y.o. 04/03/2022   Principle Diagnosis:  Iron deficiency anemia secondary to malabsorption after gastric bypass (2007)   Current Therapy:        IV iron as indicated    Interim History:  Natalie Guerrero is here today for follow up for IDA. Symptoms have improved some since her last iron infusion. She is tolerating her IV iron infusions well. She presents with her husband today.   Since her last visit she has had one more episodes of pre-syncope. This occurred at her doctors office. She has not seen a cardiologist for this. No chest pains, no dizziness with exercise. No bleeding or bruising episodes.   She just finished a course of abx for sinuitis.   No fever, chills, n/v, cough, rash, chest pain, palpitations, abdominal pain or changes in bowel or bladder habits.   She notes mild SOB at times, especially when stressed.  No swelling, numbness or tingling in her extremities. .   Wt Readings from Last 3 Encounters:  04/03/22 235 lb (106.6 kg)  03/11/22 240 lb (108.9 kg)  01/21/22 240 lb 12.8 oz (109.2 kg)   ECOG Performance Status: 1 - Symptomatic but completely ambulatory  Medications:  Allergies as of 04/03/2022       Reactions   Codeine Itching        Medication List        Accurate as of April 03, 2022  2:40 PM. If you have any questions, ask your nurse or doctor.          STOP taking these medications    amoxicillin-clavulanate 875-125 MG tablet Commonly known as: AUGMENTIN Stopped by: Natalie Closs, PA-C   benzonatate 100 MG capsule Commonly known as: Best boy Stopped by: Natalie Closs, PA-C   cycloSPORINE 0.05 % ophthalmic emulsion Commonly known as: RESTASIS Stopped by: Natalie Closs, PA-C       TAKE these medications    Aleve 220 MG tablet Generic drug: naproxen sodium Take 220 mg by mouth. Take 2 tabs bid   Biotin 10 MG  Caps Take 30 mg by mouth daily.   brimonidine 0.2 % ophthalmic solution Commonly known as: ALPHAGAN Place 1 drop into the right eye 2 (two) times daily.   buPROPion 150 MG 24 hr tablet Commonly known as: WELLBUTRIN XL TAKE 1 TABLET BY MOUTH EVERY DAY   cetirizine 10 MG tablet Commonly known as: ZYRTEC Take 10 mg by mouth daily.   docusate sodium 100 MG capsule Commonly known as: COLACE Take 200 mg by mouth 2 (two) times daily.   FIBER PO Take 4 capsules by mouth daily. prn   folic acid A999333 MCG tablet Commonly known as: FOLVITE Take 400 mcg by mouth daily.   imipramine 25 MG tablet Commonly known as: TOFRANIL Take 25 mg by mouth 3 (three) times daily. 3 tabs 2 times per day   levothyroxine 100 MCG tablet Commonly known as: SYNTHROID TAKE ONE TABLET M-F AND TAKE 1/2 PILL ON SATURDAY/SUNDAY. What changed: See the new instructions.   montelukast 10 MG tablet Commonly known as: SINGULAIR TAKE 1 TABLET BY MOUTH EVERY DAY   Mucinex DM Maximum Strength 60-1200 MG Tb12 Take 1 tablet by mouth 2 (two) times daily.   naloxone 4 MG/0.1ML Liqd nasal spray kit Commonly known as: NARCAN See admin instructions.   NON FORMULARY Beet chews 2 a day   Norel AD 4-10-325 MG Tabs Generic drug:  Chlorphen-PE-Acetaminophen TAKE 1 TABLET BY MOUTH TWICE A DAY AS NEEDED   omeprazole 40 MG capsule Commonly known as: PRILOSEC TAKE 1 CAPSULE BY MOUTH EVERY DAY   Oxycodone HCl 20 MG Tabs SMARTSIG:0.5-1 Tablet(s) By Mouth 4-6 Times Daily   tiZANidine 4 MG tablet Commonly known as: ZANAFLEX every 4 (four) hours as needed.   topiramate 100 MG tablet Commonly known as: TOPAMAX Take 100 mg by mouth 4 (four) times daily.   UNABLE TO FIND Med Name: Beet Chews take 2 chews daily   valACYclovir 500 MG tablet Commonly known as: VALTREX as needed.   Vitamin D3 50 MCG (2000 UT) Caps Take 2,000 Units by mouth daily.        Allergies:  Allergies  Allergen Reactions   Codeine  Itching    Past Medical History, Surgical history, Social history, and Family History were reviewed and updated.  Review of Systems: All other 10 point review of systems is negative.   Physical Exam:  weight is 235 lb (106.6 kg). Her oral temperature is 98.3 F (36.8 C). Her blood pressure is 108/69 and her pulse is 84. Her respiration is 17 and oxygen saturation is 99%.   Wt Readings from Last 3 Encounters:  04/03/22 235 lb (106.6 kg)  03/11/22 240 lb (108.9 kg)  01/21/22 240 lb 12.8 oz (109.2 kg)    Ocular: Sclerae with mild pallor, pupils equal, round and reactive to light Ear-nose-throat: Oropharynx clear, dentition fair Lymphatic: No cervical or supraclavicular adenopathy Lungs no rales or rhonchi, good excursion bilaterally Heart regular rate and rhythm, no murmur appreciated Neuro: non-focal, well-oriented, appropriate affect  Lab Results  Component Value Date   WBC 3.6 (L) 04/03/2022   HGB 10.5 (L) 04/03/2022   HCT 33.7 (L) 04/03/2022   MCV 103.1 (H) 04/03/2022   PLT 326 04/03/2022   Lab Results  Component Value Date   FERRITIN 51 12/24/2021   IRON 47 12/24/2021   TIBC 280 12/24/2021   UIBC 233 12/24/2021   IRONPCTSAT 17 12/24/2021   Lab Results  Component Value Date   RETICCTPCT 1.3 04/03/2022   RBC 3.27 (L) 04/03/2022   RETICCTABS 76.62 12/08/2012   No results found for: "KPAFRELGTCHN", "LAMBDASER", "[facility]" Lab Results  Component Value Date   IGGSERUM 1,206 03/28/2021   IGMSERUM 181 03/28/2021   No results found for: "TOTALPROTELP", "ALBUMINELP", "A1GS", "A2GS", "BETS", "BETA2SER", "GAMS", "MSPIKE", "SPEI"   Chemistry      Component Value Date/Time   NA 141 07/22/2021 0926   NA 137 01/16/2017 1502   NA 141 11/10/2012 1050   K 4.2 07/22/2021 0926   K 3.7 01/16/2017 1502   K 3.8 11/10/2012 1050   CL 106 07/22/2021 0926   CL 98 01/16/2017 1502   CO2 20 07/22/2021 0926   CO2 25 01/16/2017 1502   CO2 28 11/10/2012 1050   BUN 8 07/22/2021  0926   BUN 14 01/16/2017 1502   BUN 7.5 11/10/2012 1050   CREATININE 0.95 07/22/2021 0926   CREATININE 1.1 01/16/2017 1502   CREATININE 0.8 11/10/2012 1050      Component Value Date/Time   CALCIUM 9.1 07/22/2021 0926   CALCIUM 8.8 01/16/2017 1502   CALCIUM 9.0 11/10/2012 1050   ALKPHOS 106 07/22/2021 0926   ALKPHOS 84 01/16/2017 1502   ALKPHOS 66 11/10/2012 1050   AST 19 07/22/2021 0926   AST 27 01/16/2017 1502   AST 34 11/10/2012 1050   ALT 15 07/22/2021 0926   ALT 23 01/16/2017 1502  ALT 30 11/10/2012 1050   BILITOT <0.2 07/22/2021 0926   BILITOT 0.60 01/16/2017 1502   BILITOT 0.57 11/10/2012 1050      Encounter Diagnoses  Name Primary?   Iron deficiency anemia secondary to inadequate dietary iron intake Yes   Neutropenia, unspecified type (HCC)    Macrocytosis     Impression and Plan: Ms. Mondo is a very pleasant 57 yo African American female with iron deficiency anemia secondary to malabsorption since having her gastric bypass surgery in 2007.   WBC is slightly elevated, MCV elevated despite restarting folic acid and being on B12 supplementation- recently sick on ABX- feeling better. Will want to recheck this in 1 month to ensure change in CBC resolve with resolution of illness. Iron studies are currently pending. Goal is ferritin >50. Will have her return for additional iron if ferritin is <50. I have also recommended that she see a cardiologist regarding her presyncopal episodes.   Disposition: RTC 3 months APP, labs (CBC w/, B12/folate, CMP, Iron/TIBC, ferritin, reticulocytes)  Natalie Closs, PA-C 2/22/20242:40 PM

## 2022-04-04 ENCOUNTER — Other Ambulatory Visit: Payer: Self-pay | Admitting: Medical Oncology

## 2022-04-04 NOTE — Progress Notes (Signed)
Venofer orders placed

## 2022-04-10 ENCOUNTER — Other Ambulatory Visit: Payer: Self-pay | Admitting: Internal Medicine

## 2022-04-10 DIAGNOSIS — J309 Allergic rhinitis, unspecified: Secondary | ICD-10-CM

## 2022-04-15 DIAGNOSIS — M179 Osteoarthritis of knee, unspecified: Secondary | ICD-10-CM | POA: Diagnosis not present

## 2022-04-15 DIAGNOSIS — M5136 Other intervertebral disc degeneration, lumbar region: Secondary | ICD-10-CM | POA: Diagnosis not present

## 2022-04-15 DIAGNOSIS — R202 Paresthesia of skin: Secondary | ICD-10-CM | POA: Diagnosis not present

## 2022-04-15 DIAGNOSIS — G8929 Other chronic pain: Secondary | ICD-10-CM | POA: Diagnosis not present

## 2022-04-16 ENCOUNTER — Inpatient Hospital Stay: Payer: Medicare HMO | Attending: Hematology & Oncology

## 2022-04-16 VITALS — BP 102/69 | HR 85 | Temp 98.6°F | Resp 18

## 2022-04-16 DIAGNOSIS — D509 Iron deficiency anemia, unspecified: Secondary | ICD-10-CM | POA: Diagnosis not present

## 2022-04-16 DIAGNOSIS — D508 Other iron deficiency anemias: Secondary | ICD-10-CM

## 2022-04-16 DIAGNOSIS — Z9884 Bariatric surgery status: Secondary | ICD-10-CM | POA: Insufficient documentation

## 2022-04-16 DIAGNOSIS — K909 Intestinal malabsorption, unspecified: Secondary | ICD-10-CM | POA: Insufficient documentation

## 2022-04-16 MED ORDER — SODIUM CHLORIDE 0.9 % IV SOLN: Freq: Once | INTRAVENOUS | Status: AC

## 2022-04-16 MED ORDER — SODIUM CHLORIDE 0.9 % IV SOLN
300.0000 mg | Freq: Once | INTRAVENOUS | Status: AC
  Administered 2022-04-16: 300 mg via INTRAVENOUS
  Filled 2022-04-16: qty 300

## 2022-04-16 NOTE — Patient Instructions (Signed)

## 2022-04-18 ENCOUNTER — Other Ambulatory Visit: Payer: Self-pay | Admitting: Internal Medicine

## 2022-04-21 DIAGNOSIS — I890 Lymphedema, not elsewhere classified: Secondary | ICD-10-CM | POA: Diagnosis not present

## 2022-04-23 ENCOUNTER — Inpatient Hospital Stay: Payer: Medicare HMO

## 2022-04-23 VITALS — BP 147/87 | HR 77 | Temp 98.3°F | Resp 18

## 2022-04-23 DIAGNOSIS — D508 Other iron deficiency anemias: Secondary | ICD-10-CM

## 2022-04-23 DIAGNOSIS — Z9884 Bariatric surgery status: Secondary | ICD-10-CM | POA: Diagnosis not present

## 2022-04-23 DIAGNOSIS — K909 Intestinal malabsorption, unspecified: Secondary | ICD-10-CM

## 2022-04-23 DIAGNOSIS — D509 Iron deficiency anemia, unspecified: Secondary | ICD-10-CM | POA: Diagnosis not present

## 2022-04-23 MED ORDER — SODIUM CHLORIDE 0.9 % IV SOLN
300.0000 mg | Freq: Once | INTRAVENOUS | Status: AC
  Administered 2022-04-23: 300 mg via INTRAVENOUS
  Filled 2022-04-23: qty 300

## 2022-04-23 MED ORDER — SODIUM CHLORIDE 0.9 % IV SOLN: Freq: Once | INTRAVENOUS | Status: AC

## 2022-04-23 NOTE — Patient Instructions (Signed)

## 2022-04-25 DIAGNOSIS — M533 Sacrococcygeal disorders, not elsewhere classified: Secondary | ICD-10-CM | POA: Diagnosis not present

## 2022-04-30 ENCOUNTER — Inpatient Hospital Stay: Payer: Medicare HMO

## 2022-04-30 VITALS — BP 145/70 | HR 70 | Temp 98.3°F | Resp 18

## 2022-04-30 DIAGNOSIS — K909 Intestinal malabsorption, unspecified: Secondary | ICD-10-CM

## 2022-04-30 DIAGNOSIS — Z9884 Bariatric surgery status: Secondary | ICD-10-CM | POA: Diagnosis not present

## 2022-04-30 DIAGNOSIS — D508 Other iron deficiency anemias: Secondary | ICD-10-CM

## 2022-04-30 DIAGNOSIS — D509 Iron deficiency anemia, unspecified: Secondary | ICD-10-CM | POA: Diagnosis not present

## 2022-04-30 MED ORDER — SODIUM CHLORIDE 0.9% FLUSH: 3.0000 mL | Freq: Once | INTRAVENOUS | Status: DC | PRN

## 2022-04-30 MED ORDER — SODIUM CHLORIDE 0.9 % IV SOLN
300.0000 mg | Freq: Once | INTRAVENOUS | Status: AC
  Administered 2022-04-30: 300 mg via INTRAVENOUS
  Filled 2022-04-30: qty 300

## 2022-04-30 MED ORDER — SODIUM CHLORIDE 0.9 % IV SOLN: Freq: Once | INTRAVENOUS | Status: AC

## 2022-04-30 MED ORDER — SODIUM CHLORIDE 0.9% FLUSH: 10.0000 mL | Freq: Once | INTRAVENOUS | Status: DC | PRN

## 2022-05-08 DIAGNOSIS — M533 Sacrococcygeal disorders, not elsewhere classified: Secondary | ICD-10-CM | POA: Diagnosis not present

## 2022-05-13 DIAGNOSIS — R202 Paresthesia of skin: Secondary | ICD-10-CM | POA: Diagnosis not present

## 2022-05-13 DIAGNOSIS — M179 Osteoarthritis of knee, unspecified: Secondary | ICD-10-CM | POA: Diagnosis not present

## 2022-05-13 DIAGNOSIS — G8929 Other chronic pain: Secondary | ICD-10-CM | POA: Diagnosis not present

## 2022-05-13 DIAGNOSIS — M5430 Sciatica, unspecified side: Secondary | ICD-10-CM | POA: Diagnosis not present

## 2022-05-21 ENCOUNTER — Ambulatory Visit (INDEPENDENT_AMBULATORY_CARE_PROVIDER_SITE_OTHER): Payer: Medicare HMO

## 2022-05-21 ENCOUNTER — Encounter: Payer: Self-pay | Admitting: Internal Medicine

## 2022-05-21 ENCOUNTER — Ambulatory Visit (INDEPENDENT_AMBULATORY_CARE_PROVIDER_SITE_OTHER): Payer: Medicare HMO | Admitting: Internal Medicine

## 2022-05-21 VITALS — BP 116/62 | HR 99 | Temp 98.4°F | Ht 64.0 in | Wt 238.0 lb

## 2022-05-21 VITALS — BP 116/62 | HR 99 | Temp 98.4°F | Ht 64.0 in | Wt 238.4 lb

## 2022-05-21 DIAGNOSIS — Z6841 Body Mass Index (BMI) 40.0 and over, adult: Secondary | ICD-10-CM | POA: Diagnosis not present

## 2022-05-21 DIAGNOSIS — E039 Hypothyroidism, unspecified: Secondary | ICD-10-CM | POA: Diagnosis not present

## 2022-05-21 DIAGNOSIS — Z87898 Personal history of other specified conditions: Secondary | ICD-10-CM

## 2022-05-21 DIAGNOSIS — F331 Major depressive disorder, recurrent, moderate: Secondary | ICD-10-CM | POA: Diagnosis not present

## 2022-05-21 DIAGNOSIS — Z0000 Encounter for general adult medical examination without abnormal findings: Secondary | ICD-10-CM

## 2022-05-21 DIAGNOSIS — R69 Illness, unspecified: Secondary | ICD-10-CM | POA: Diagnosis not present

## 2022-05-21 MED ORDER — SCOPOLAMINE 1 MG/3DAYS TD PT72: 1.0000 | MEDICATED_PATCH | TRANSDERMAL | 0 refills | Status: DC

## 2022-05-21 NOTE — Patient Instructions (Signed)

## 2022-05-21 NOTE — Patient Instructions (Signed)
Natalie Guerrero , Thank you for taking time to come for your Medicare Wellness Visit. I appreciate your ongoing commitment to your health goals. Please review the following plan we discussed and let me know if I can assist you in the future.   These are the goals we discussed:  Goals      Exercise 3x per week (30 min per time)     Patient Stated     12/28/2019, wants to walk 2000 steps daily     Patient Stated     04/12/2020, increase steps by 1000 consistently     Patient Stated     05/01/2021, wants to lose weight and have less stress     Patient Stated     05/21/2022, wants to lose 20 pounds by the end of the year     Weight (lb) < 200 lb (90.7 kg)     Our initial goal is to achieve BMI less than 39.      Weight (lb) < 200 lb (90.7 kg)     09/09/2018, wants to lose 25 pounds        This is a list of the screening recommended for you and due dates:  Health Maintenance  Topic Date Due   COVID-19 Vaccine (4 - 2023-24 season) 10/11/2021   Flu Shot  09/11/2022   Mammogram  05/08/2023   Medicare Annual Wellness Visit  05/21/2023   Colon Cancer Screening  08/22/2025   DTaP/Tdap/Td vaccine (2 - Td or Tdap) 11/30/2028   Hepatitis C Screening: USPSTF Recommendation to screen - Ages 71-79 yo.  Completed   HIV Screening  Completed   Zoster (Shingles) Vaccine  Completed   HPV Vaccine  Aged Out   Pap Smear  Discontinued    Advanced directives: Please bring a copy of your POA (Power of Attorney) and/or Living Will to your next appointment.   Conditions/risks identified: none  Next appointment: Follow up in one year for your annual wellness visit.   Preventive Care 40-64 Years, Female Preventive care refers to lifestyle choices and visits with your health care provider that can promote health and wellness. What does preventive care include? A yearly physical exam. This is also called an annual well check. Dental exams once or twice a year. Routine eye exams. Ask your health care  provider how often you should have your eyes checked. Personal lifestyle choices, including: Daily care of your teeth and gums. Regular physical activity. Eating a healthy diet. Avoiding tobacco and drug use. Limiting alcohol use. Practicing safe sex. Taking low-dose aspirin daily starting at age 58. Taking vitamin and mineral supplements as recommended by your health care provider. What happens during an annual well check? The services and screenings done by your health care provider during your annual well check will depend on your age, overall health, lifestyle risk factors, and family history of disease. Counseling  Your health care provider may ask you questions about your: Alcohol use. Tobacco use. Drug use. Emotional well-being. Home and relationship well-being. Sexual activity. Eating habits. Work and work Astronomer. Method of birth control. Menstrual cycle. Pregnancy history. Screening  You may have the following tests or measurements: Height, weight, and BMI. Blood pressure. Lipid and cholesterol levels. These may be checked every 5 years, or more frequently if you are over 54 years old. Skin check. Lung cancer screening. You may have this screening every year starting at age 58 if you have a 30-pack-year history of smoking and currently smoke or have quit  within the past 15 years. Fecal occult blood test (FOBT) of the stool. You may have this test every year starting at age 58. Flexible sigmoidoscopy or colonoscopy. You may have a sigmoidoscopy every 5 years or a colonoscopy every 10 years starting at age 58. Hepatitis C blood test. Hepatitis B blood test. Sexually transmitted disease (STD) testing. Diabetes screening. This is done by checking your blood sugar (glucose) after you have not eaten for a while (fasting). You may have this done every 1-3 years. Mammogram. This may be done every 1-2 years. Talk to your health care provider about when you should start  having regular mammograms. This may depend on whether you have a family history of breast cancer. BRCA-related cancer screening. This may be done if you have a family history of breast, ovarian, tubal, or peritoneal cancers. Pelvic exam and Pap test. This may be done every 3 years starting at age 58. Starting at age 58, this may be done every 5 years if you have a Pap test in combination with an HPV test. Bone density scan. This is done to screen for osteoporosis. You may have this scan if you are at high risk for osteoporosis. Discuss your test results, treatment options, and if necessary, the need for more tests with your health care provider. Vaccines  Your health care provider may recommend certain vaccines, such as: Influenza vaccine. This is recommended every year. Tetanus, diphtheria, and acellular pertussis (Tdap, Td) vaccine. You may need a Td booster every 10 years. Zoster vaccine. You may need this after age 58. Pneumococcal 13-valent conjugate (PCV13) vaccine. You may need this if you have certain conditions and were not previously vaccinated. Pneumococcal polysaccharide (PPSV23) vaccine. You may need one or two doses if you smoke cigarettes or if you have certain conditions. Talk to your health care provider about which screenings and vaccines you need and how often you need them. This information is not intended to replace advice given to you by your health care provider. Make sure you discuss any questions you have with your health care provider. Document Released: 02/23/2015 Document Revised: 10/17/2015 Document Reviewed: 11/28/2014 Elsevier Interactive Patient Education  2017 [employer]    Fall Prevention in the Home Falls can cause injuries. They can happen to people of all ages. There are many things you can do to make your home safe and to help prevent falls. What can I do on the outside of my home? Regularly fix the edges of walkways and driveways and fix any  cracks. Remove anything that might make you trip as you walk through a door, such as a raised step or threshold. Trim any bushes or trees on the path to your home. Use bright outdoor lighting. Clear any walking paths of anything that might make someone trip, such as rocks or tools. Regularly check to see if handrails are loose or broken. Make sure that both sides of any steps have handrails. Any raised decks and porches should have guardrails on the edges. Have any leaves, snow, or ice cleared regularly. Use sand or salt on walking paths during winter. Clean up any spills in your garage right away. This includes oil or grease spills. What can I do in the bathroom? Use night lights. Install grab bars by the toilet and in the tub and shower. Do not use towel bars as grab bars. Use non-skid mats or decals in the tub or shower. If you need to sit down in the shower, use a plastic,  non-slip stool. Keep the floor dry. Clean up any water that spills on the floor as soon as it happens. Remove soap buildup in the tub or shower regularly. Attach bath mats securely with double-sided non-slip rug tape. Do not have throw rugs and other things on the floor that can make you trip. What can I do in the bedroom? Use night lights. Make sure that you have a light by your bed that is easy to reach. Do not use any sheets or blankets that are too big for your bed. They should not hang down onto the floor. Have a firm chair that has side arms. You can use this for support while you get dressed. Do not have throw rugs and other things on the floor that can make you trip. What can I do in the kitchen? Clean up any spills right away. Avoid walking on wet floors. Keep items that you use a lot in easy-to-reach places. If you need to reach something above you, use a strong step stool that has a grab bar. Keep electrical cords out of the way. Do not use floor polish or wax that makes floors slippery. If you must  use wax, use non-skid floor wax. Do not have throw rugs and other things on the floor that can make you trip. What can I do with my stairs? Do not leave any items on the stairs. Make sure that there are handrails on both sides of the stairs and use them. Fix handrails that are broken or loose. Make sure that handrails are as long as the stairways. Check any carpeting to make sure that it is firmly attached to the stairs. Fix any carpet that is loose or worn. Avoid having throw rugs at the top or bottom of the stairs. If you do have throw rugs, attach them to the floor with carpet tape. Make sure that you have a light switch at the top of the stairs and the bottom of the stairs. If you do not have them, ask someone to add them for you. What else can I do to help prevent falls? Wear shoes that: Do not have high heels. Have rubber bottoms. Are comfortable and fit you well. Are closed at the toe. Do not wear sandals. If you use a stepladder: Make sure that it is fully opened. Do not climb a closed stepladder. Make sure that both sides of the stepladder are locked into place. Ask someone to hold it for you, if possible. Clearly mark and make sure that you can see: Any grab bars or handrails. First and last steps. Where the edge of each step is. Use tools that help you move around (mobility aids) if they are needed. These include: Canes. Walkers. Scooters. Crutches. Turn on the lights when you go into a dark area. Replace any light bulbs as soon as they burn out. Set up your furniture so you have a clear path. Avoid moving your furniture around. If any of your floors are uneven, fix them. If there are any pets around you, be aware of where they are. Review your medicines with your doctor. Some medicines can make you feel dizzy. This can increase your chance of falling. Ask your doctor what other things that you can do to help prevent falls. This information is not intended to replace  advice given to you by your health care provider. Make sure you discuss any questions you have with your health care provider. Document Released: 11/23/2008 Document Revised: 07/05/2015 Document  Reviewed: 03/03/2014 Elsevier Interactive Patient Education  2017 [employer].

## 2022-05-21 NOTE — Progress Notes (Signed)
I,Victoria T Hamilton,acting as a scribe for [name], MD.,have documented all relevant documentation on the behalf of [name], MD,as directed by  [name], MD while in the presence of [name], MD.    Subjective:     Patient ID: Natalie Guerrero , female    DOB: 05-30-1965 , 57 y.o.   MRN: [id number]   Chief Complaint  Patient presents with   Hypothyroidism    HPI  The patient is here today for a thyroid follow-up.  She reports compliance with levothyroxine supplementation. She has not had any issues with the medication.   She will be taking a cruise to [place] soon. She would like patches for motion sickness.  Thyroid Problem Presents for follow-up visit. Patient reports no diarrhea, dry skin, hoarse voice, menstrual problem or palpitations.     Past Medical History:  Diagnosis Date   Allergy 1985   Anxiety    Arthritis    DDD (degenerative disc disease)    Depression    GERD (gastroesophageal reflux disease) 2019   Glaucoma 2023   Heart murmur 09-2019   Hypothyroidism 12/08/2012   Iron deficiency anemia, unspecified 11/10/2012   Iron malabsorption 01/20/2017   Neuromuscular disorder 2012   Since back surgery   Obesity    Thyroid disease hypothroidism     Family History  Problem Relation Age of Onset   Heart failure Mother    Anxiety disorder Mother    Arthritis Mother    Heart disease Mother    Hypertension Mother    Stroke Mother    Varicose Veins Mother    Prostate cancer Father    Alcohol abuse Father    Arthritis Father    Cancer Father    COPD Father    Depression Father    Hypertension Father    Obesity Father    Alcohol abuse Brother    Anxiety disorder Brother    Arthritis Brother    Drug abuse Brother    Hypertension Brother    Alcohol abuse Brother    Anxiety disorder Sister    Obesity Sister    Obesity Brother    Colon cancer Neg Hx    Diabetes Neg Hx    Kidney disease Neg Hx    Liver disease Neg  Hx    Neuropathy Neg Hx      Current Outpatient Medications:    scopolamine (TRANSDERM SCOP, 1.5 MG,) 1 MG/3DAYS, Place 1 patch (1.5 mg total) onto the skin every 3 (three) days., Disp: 10 patch, Rfl: 0   b complex vitamins capsule, Take 1 capsule by mouth daily., Disp: , Rfl:    Biotin 10 MG CAPS, Take 30 mg by mouth daily. , Disp: , Rfl:    brimonidine (ALPHAGAN) 0.2 % ophthalmic solution, Place 1 drop into the right eye 2 (two) times daily., Disp: , Rfl:    buPROPion (WELLBUTRIN XL) 150 MG 24 hr tablet, TAKE 1 TABLET BY MOUTH EVERY DAY, Disp: 90 tablet, Rfl: 1   cetirizine (ZYRTEC) 10 MG tablet, Take 10 mg by mouth daily., Disp: , Rfl:    Cholecalciferol (VITAMIN D3) 50 MCG (2000 UT) capsule, Take 2,000 Units by mouth daily., Disp: , Rfl:    Collagen Hydrolysate POWD, by Does not apply route., Disp: , Rfl:    Dextromethorphan-guaiFENesin (MUCINEX DM MAXIMUM STRENGTH) 60-1200 MG TB12, Take 1 tablet by mouth 2 (two) times daily., Disp: , Rfl:    docusate sodium (COLACE) 100 MG capsule,  Take 200 mg by mouth 2 (two) times daily. , Disp: , Rfl:    FIBER PO, Take 4 capsules by mouth daily. prn, Disp: , Rfl:    folic acid (FOLVITE) 400 MCG tablet, Take 400 mcg by mouth daily., Disp: , Rfl:    imipramine (TOFRANIL) 25 MG tablet, Take 25 mg by mouth 3 (three) times daily. 3 tabs 2 times per day, Disp: , Rfl: 4   levothyroxine (SYNTHROID) 100 MCG tablet, TAKE ONE TABLET M-F AND TAKE 1/2 PILL ON SATURDAY/SUNDAY., Disp: 90 tablet, Rfl: 0   Magnesium 200 MG TABS, Take 4 tablets by mouth daily., Disp: , Rfl:    montelukast (SINGULAIR) 10 MG tablet, TAKE 1 TABLET BY MOUTH EVERY DAY, Disp: 90 tablet, Rfl: 1   naloxone (NARCAN) nasal spray 4 mg/0.1 mL, See admin instructions., Disp: , Rfl:    naproxen sodium (ALEVE) 220 MG tablet, Take 220 mg by mouth. Take 2 tabs bid, Disp: , Rfl:    NON FORMULARY, Beet chews 2 a day, Disp: , Rfl:    NOREL AD 4-10-325 MG TABS, TAKE 1 TABLET BY MOUTH TWICE A DAY AS  NEEDED, Disp: 20 tablet, Rfl: 0   omeprazole (PRILOSEC) 40 MG capsule, TAKE 1 CAPSULE BY MOUTH EVERY DAY, Disp: 90 capsule, Rfl: 1   Oxycodone HCl 20 MG TABS, SMARTSIG:0.5-1 Tablet(s) By Mouth 4-6 Times Daily, Disp: , Rfl:    tiZANidine (ZANAFLEX) 4 MG tablet, every 4 (four) hours as needed., Disp: , Rfl:    topiramate (TOPAMAX) 100 MG tablet, Take 100 mg by mouth 4 (four) times daily. , Disp: , Rfl: 5   UNABLE TO FIND, Med Name: Beet Chews take 2 chews daily, Disp: , Rfl:    valACYclovir (VALTREX) 500 MG tablet, as needed., Disp: , Rfl:    Zinc Citrate (ZINC GUMMY PO), Take by mouth., Disp: , Rfl:   Current Facility-Administered Medications:    triamcinolone acetonide (KENALOG) 10 MG/ML injection 20 mg, 20 mg, Other, Once, Regal, Kirstie Peri, DPM   Allergies  Allergen Reactions   Codeine Itching     Review of Systems  Constitutional: Negative.   HENT:  Negative for hoarse voice.   Respiratory: Negative.    Cardiovascular: Negative.  Negative for palpitations.  Gastrointestinal: Negative.  Negative for diarrhea.  Genitourinary:  Negative for menstrual problem.  Musculoskeletal: Negative.   Neurological: Negative.   Psychiatric/Behavioral: Negative.       Today's Vitals   05/21/22 1149  BP: 116/62  Pulse: 99  Temp: 98.4 F (36.9 C)  SpO2: 99%  Weight: 238 lb (108 kg)  Height: 5\' 4"  (1.626 m)   Body mass index is 40.85 kg/m.  Wt Readings from Last 3 Encounters:  05/21/22 238 lb (108 kg)  05/21/22 238 lb 6.4 oz (108.1 kg)  04/03/22 235 lb (106.6 kg)    Objective:  Physical Exam Vitals and nursing note reviewed.  Constitutional:      Appearance: Normal appearance. She is obese.  HENT:     Head: Normocephalic and atraumatic.     Nose:     Comments: MASKED     Mouth/Throat:     Comments: MASKED  Eyes:     Extraocular Movements: Extraocular movements intact.  Cardiovascular:     Rate and Rhythm: Normal rate and regular rhythm.     Heart sounds: Normal heart sounds.   Pulmonary:     Effort: Pulmonary effort is normal.     Breath sounds: Normal breath sounds. No stridor.  Musculoskeletal:     Cervical back: Normal range of motion.  Skin:    General: Skin is warm.  Neurological:     General: No focal deficit present.     Mental Status: She is alert.  Psychiatric:        Mood and Affect: Mood normal.        Behavior: Behavior normal.      Assessment And Plan:     1. Primary hypothyroidism Comments: Chronic, I will check thyroid panel and adjust meds as needed. She wlll rto in 4-6 months for re-evaluation. - CMP14+EGFR - TSH + free T4  2. Moderate episode of recurrent major depressive disorder Comments: Chronic, her sx are stable. She will c/w Wellbutrin XL daily. Reminded to avoid abruptly stopping the medication.  3. Class 3 severe obesity due to excess calories with serious comorbidity and body mass index (BMI) of 40.0 to 44.9 in adult Comments: BMI 40. She is encouraged to initially strive for BMI less than 35 to decrease cardiac risk. Advised to aim for at least 150 minutes of exercise per week.  4. History of motion sickness Comments: She was given rx scopolamine patches to use prn. Advised to apply to area of clean, dry skin behind the ear one hour prior to getting on the cruise ship.    Patient was given opportunity to ask questions. Patient verbalized understanding of the plan and was able to repeat key elements of the plan. All questions were answered to their satisfaction.   I, Gwynneth Alimentobyn N Mazzie Brodrick, MD, have reviewed all documentation for this visit. The documentation on 05/21/22 for the exam, diagnosis, procedures, and orders are all accurate and complete.   IF YOU HAVE BEEN REFERRED TO A SPECIALIST, IT MAY TAKE 1-2 WEEKS TO SCHEDULE/PROCESS THE REFERRAL. IF YOU HAVE NOT HEARD FROM US/SPECIALIST IN TWO WEEKS, PLEASE GIVE US A CALL AT [phone].   THE PATIENT IS ENCOURAGED TO PRACTICE SOCIAL DISTANCING DUE TO THE COVID-19  PANDEMIC.

## 2022-05-21 NOTE — Progress Notes (Signed)
Subjective:   Natalie Guerrero is a 57 y.o. female who presents for Medicare Annual (Subsequent) preventive examination.  Review of Systems     Cardiac Risk Factors include: obesity (BMI >30kg/m2)     Objective:    Today's Vitals   05/21/22 1128 05/21/22 1135  BP: 116/62   Pulse: 99   Temp: 98.4 F (36.9 C)   TempSrc: Oral   SpO2: 99%   Weight: 238 lb 6.4 oz (108.1 kg)   Height: 5\' 4"  (1.626 m)   PainSc:  4    Body mass index is 40.92 kg/m.     05/21/2022   11:44 AM 04/03/2022   11:09 AM 12/24/2021    3:02 PM 09/23/2021    9:30 AM 05/01/2021    9:19 AM 04/12/2020    2:11 PM 12/28/2019   12:35 PM  Advanced Directives  Does Patient Have a Medical Advance Directive? Yes No;Yes No No No No No  Type of Estate agent of [place];Living will Living will;Healthcare Power of Attorney       Does patient want to make changes to medical advance directive?  No - Patient declined       Copy of Healthcare Power of Attorney in Chart? No - copy requested No - copy requested       Would patient like information on creating a medical advance directive?  No - Patient declined No - Patient declined No - Patient declined   No - Patient declined    Current Medications (verified) Outpatient Encounter Medications as of 05/21/2022  Medication Sig   b complex vitamins capsule Take 1 capsule by mouth daily.   brimonidine (ALPHAGAN) 0.2 % ophthalmic solution Place 1 drop into the right eye 2 (two) times daily.   buPROPion (WELLBUTRIN XL) 150 MG 24 hr tablet TAKE 1 TABLET BY MOUTH EVERY DAY   cetirizine (ZYRTEC) 10 MG tablet Take 10 mg by mouth daily.   Cholecalciferol (VITAMIN D3) 50 MCG (2000 UT) capsule Take 2,000 Units by mouth daily.   Collagen Hydrolysate POWD by Does not apply route.   Dextromethorphan-guaiFENesin (MUCINEX DM MAXIMUM STRENGTH) 60-1200 MG TB12 Take 1 tablet by mouth 2 (two) times daily.   docusate sodium (COLACE) 100 MG capsule Take 200 mg by  mouth 2 (two) times daily.    FIBER PO Take 4 capsules by mouth daily. prn   folic acid (FOLVITE) 400 MCG tablet Take 400 mcg by mouth daily.   imipramine (TOFRANIL) 25 MG tablet Take 25 mg by mouth 3 (three) times daily. 3 tabs 2 times per day   levothyroxine (SYNTHROID) 100 MCG tablet TAKE ONE TABLET M-F AND TAKE 1/2 PILL ON SATURDAY/SUNDAY.   Magnesium 200 MG TABS Take 4 tablets by mouth daily.   montelukast (SINGULAIR) 10 MG tablet TAKE 1 TABLET BY MOUTH EVERY DAY   naloxone (NARCAN) nasal spray 4 mg/0.1 mL See admin instructions.   NON FORMULARY Beet chews 2 a day   NOREL AD 4-10-325 MG TABS TAKE 1 TABLET BY MOUTH TWICE A DAY AS NEEDED   omeprazole (PRILOSEC) 40 MG capsule TAKE 1 CAPSULE BY MOUTH EVERY DAY   OVER THE COUNTER MEDICATION Ginger tea   Oxycodone HCl 20 MG TABS SMARTSIG:0.5-1 Tablet(s) By Mouth 4-6 Times Daily   tiZANidine (ZANAFLEX) 4 MG tablet every 4 (four) hours as needed.   topiramate (TOPAMAX) 100 MG tablet Take 100 mg by mouth 4 (four) times daily.    valACYclovir (VALTREX) 500 MG tablet as needed.  Zinc Citrate (ZINC GUMMY PO) Take by mouth.   Biotin 10 MG CAPS Take 30 mg by mouth daily.    naproxen sodium (ALEVE) 220 MG tablet Take 220 mg by mouth. Take 2 tabs bid   UNABLE TO FIND Med Name: Beet Chews take 2 chews daily   Facility-Administered Encounter Medications as of 05/21/2022  Medication   triamcinolone acetonide (KENALOG) 10 MG/ML injection 20 mg    Allergies (verified) Codeine   History: Past Medical History:  Diagnosis Date   Allergy 1985   Anxiety    Arthritis    DDD (degenerative disc disease)    Depression    GERD (gastroesophageal reflux disease) 2019   Glaucoma 2023   Heart murmur 09-2019   Hypothyroidism 12/08/2012   Iron deficiency anemia, unspecified 11/10/2012   Iron malabsorption 01/20/2017   Neuromuscular disorder 2012   Since back surgery   Obesity    Thyroid disease hypothroidism   Past Surgical History:  Procedure  Laterality Date   ABDOMINAL HYSTERECTOMY  02/10/1997   BACK SURGERY     CESAREAN SECTION  02/10/1989   CHOLECYSTECTOMY N/A 12/22/2017   Procedure: LAPAROSCOPIC CHOLECYSTECTOMY;  Surgeon: Axel Filler, MD;  Location: [facility] OR;  Service: General;  Laterality: N/A;   COLON SURGERY  2000   Gastric bypass   GASTRIC BYPASS  02/10/2005   KNEE SURGERY Left 02/10/1998   SMALL INTESTINE SURGERY  2000   Gastric bypass   SPINE SURGERY  2012   Family History  Problem Relation Age of Onset   Heart failure Mother    Anxiety disorder Mother    Arthritis Mother    Heart disease Mother    Hypertension Mother    Stroke Mother    Varicose Veins Mother    Prostate cancer Father    Alcohol abuse Father    Arthritis Father    Cancer Father    COPD Father    Depression Father    Hypertension Father    Obesity Father    Alcohol abuse Brother    Anxiety disorder Brother    Arthritis Brother    Drug abuse Brother    Hypertension Brother    Alcohol abuse Brother    Anxiety disorder Sister    Obesity Sister    Obesity Brother    Colon cancer Neg Hx    Diabetes Neg Hx    Kidney disease Neg Hx    Liver disease Neg Hx    Neuropathy Neg Hx    Social History   Socioeconomic History   Marital status: Married    Spouse name: Not on file   Number of children: Not on file   Years of education: Not on file   Highest education level: Some college, no degree  Occupational History   Occupation: disability  Tobacco Use   Smoking status: Never   Smokeless tobacco: Never   Tobacco comments:    NA  Vaping Use   Vaping Use: Never used  Substance and Sexual Activity   Alcohol use: Not Currently   Drug use: Yes    Types: Oxycodone    Comment: Medications under Dr care   Sexual activity: Yes    Birth control/protection: Post-menopausal    Comment: I don't have a healthy sex life due to back injury  Other Topics Concern   Not on file  Social History Narrative   Not on file   Social  Determinants of Health   Financial Resource Strain: Low Risk  (05/21/2022)  Overall Financial Resource Strain (CARDIA)    Difficulty of Paying Living Expenses: Not hard at all  Food Insecurity: No Food Insecurity (05/21/2022)   Hunger Vital Sign    Worried About Running Out of Food in the Last Year: Never true    Ran Out of Food in the Last Year: Never true  Transportation Needs: No Transportation Needs (05/21/2022)   PRAPARE - Administrator, Civil ServiceTransportation    Lack of Transportation (Medical): No    Lack of Transportation (Non-Medical): No  Physical Activity: Inactive (05/21/2022)   Exercise Vital Sign    Days of Exercise per Week: 0 days    Minutes of Exercise per Session: 10 min  Stress: No Stress Concern Present (05/21/2022)   [employer] - Occupational Stress Questionnaire    Feeling of Stress : Not at all  Social Connections: Moderately Isolated (05/21/2022)   Social Connection and Isolation Panel [NHANES]    Frequency of Communication with Friends and Family: Three times a week    Frequency of Social Gatherings with Friends and Family: Never    Attends Religious Services: Never    Database administratorActive Member of Clubs or Organizations: No    Attends Engineer, structuralClub or Organization Meetings: Never    Marital Status: Married    Tobacco Counseling Counseling given: Not Answered Tobacco comments: NA   Clinical Intake:  Pre-visit preparation completed: Yes  Pain : 0-10 Pain Score: 4  Pain Type: Chronic pain Pain Location: Back Pain Orientation: Lower Pain Radiating Towards: down legs Pain Descriptors / Indicators: Aching Pain Onset: More than a month ago Pain Frequency: Constant     Nutritional Status: BMI > 30  Obese Nutritional Risks: Nausea/ vomitting/ diarrhea (nauseous a couple days ago) Diabetes: No  How often do you need to have someone help you when you read instructions, pamphlets, or other written materials from your doctor or pharmacy?: 1 - Never  Diabetic?  no  Interpreter Needed?: No  Information entered by :: NAllen LPN   Activities of Daily Living    05/21/2022    7:57 AM  In your present state of health, do you have any difficulty performing the following activities:  Hearing? 0  Vision? 1  Comment at night  Difficulty concentrating or making decisions? 0  Walking or climbing stairs? 1  Comment due to back  Dressing or bathing? 0  Doing errands, shopping? 0  Preparing Food and eating ? Y  Using the Toilet? N  In the past six months, have you accidently leaked urine? Y  Do you have problems with loss of bowel control? N  Managing your Medications? N  Managing your Finances? N  Housekeeping or managing your Housekeeping? Y    Patient Care Team: Dorothyann PengSanders, Robyn, MD as PCP - General (Internal Medicine) Dalton-Bethea, Ashok CroonShawn M, MD as Consulting Physician (Physical Medicine and Rehabilitation) Ewing Schleinrisp, Gregory, MD as Consulting Physician (Pain Medicine)  Indicate any recent Medical Services you may have received from other than [facility] providers in the past year (date may be approximate).     Assessment:   This is a routine wellness examination for [place].  Hearing/Vision screen Vision Screening - Comments:: Regular eye exams, Dr. Harlon FlorWhitaker  Dietary issues and exercise activities discussed: Current Exercise Habits: The patient does not participate in regular exercise at present   Goals Addressed             This Visit's Progress    Patient Stated       05/21/2022, wants to lose 20  pounds by the end of the year       Depression Screen    05/21/2022   11:46 AM 01/21/2022    9:48 AM 07/22/2021    8:41 AM 05/01/2021    9:20 AM 04/11/2021    4:04 PM 01/08/2021   10:48 AM 07/04/2020   11:06 AM  PHQ 2/9 Scores  PHQ - 2 Score 0 0 0 0 0 2 5  PHQ- 9 Score  0 0  3 14 5     Fall Risk    05/21/2022    7:57 AM 05/01/2021    9:19 AM 04/12/2020    2:12 PM 12/28/2019   12:35 PM 11/08/2018    4:10 PM  Fall Risk   Falls in the  past year? 1 1 1 1 1   Comment blacked out got up too fast tripped, lost balance lost balance   Number falls in past yr: 1 1 1  0 0  Injury with Fall? 0 0 0 0 0  Risk for fall due to : Medication side effect;History of fall(s);Impaired balance/gait Medication side effect Impaired balance/gait;Medication side effect Impaired balance/gait;History of fall(s);Medication side effect   Follow up Falls prevention discussed;Education provided;Falls evaluation completed Falls evaluation completed;Education provided;Falls prevention discussed Falls evaluation completed;Education provided;Falls prevention discussed      FALL RISK PREVENTION PERTAINING TO THE HOME:  Any stairs in or around the home? Yes  If so, are there any without handrails? No  Home free of loose throw rugs in walkways, pet beds, electrical cords, etc? Yes  Adequate lighting in your home to reduce risk of falls? Yes   ASSISTIVE DEVICES UTILIZED TO PREVENT FALLS:  Life alert? No  Use of a cane, walker or w/c? No  Grab bars in the bathroom? Yes  Shower chair or bench in shower? Yes  Elevated toilet seat or a handicapped toilet? Yes   TIMED UP AND GO:  Was the test performed? Yes .  Length of time to ambulate 10 feet: 7 sec.   Gait slow and steady without use of assistive device  Cognitive Function:        05/21/2022   11:47 AM 05/01/2021    9:22 AM 04/12/2020    2:14 PM 12/28/2019   12:37 PM 09/09/2018   12:03 PM  6CIT Screen  What Year? 0 points 0 points 0 points 0 points 0 points  What month? 0 points 0 points 0 points 0 points 0 points  What time? 0 points 0 points 0 points 0 points 0 points  Count back from 20 0 points 0 points 0 points 0 points 0 points  Months in reverse 0 points 0 points 0 points 0 points 0 points  Repeat phrase 0 points 2 points 2 points 0 points 2 points  Total Score 0 points 2 points 2 points 0 points 2 points    Immunizations Immunization History  Administered Date(s) Administered    Influenza,inj,Quad PF,6+ Mos 12/31/2017, 11/08/2018, 12/28/2019, 04/11/2021, 02/25/2022   PFIZER(Purple Top)SARS-COV-2 Vaccination 05/05/2019, 05/30/2019, 02/10/2020   Tdap 12/01/2018   Zoster Recombinat (Shingrix) 10/29/2021, 01/21/2022    TDAP status: Up to date  Flu Vaccine status: Up to date  Pneumococcal vaccine status: Up to date  Covid-19 vaccine status: Completed vaccines  Qualifies for Shingles Vaccine? Yes   Zostavax completed Yes   Shingrix Completed?: Yes  Screening Tests Health Maintenance  Topic Date Due   COVID-19 Vaccine (4 - 2023-24 season) 10/11/2021   Medicare Annual Wellness (AWV)  05/02/2022  INFLUENZA VACCINE  09/11/2022   MAMMOGRAM  05/08/2023   COLONOSCOPY (Pts 45-44yrs Insurance coverage will need to be confirmed)  08/22/2025   DTaP/Tdap/Td (2 - Td or Tdap) 11/30/2028   Hepatitis C Screening  Completed   HIV Screening  Completed   Zoster Vaccines- Shingrix  Completed   HPV VACCINES  Aged Out   PAP SMEAR-Modifier  Discontinued    Health Maintenance  Health Maintenance Due  Topic Date Due   COVID-19 Vaccine (4 - 2023-24 season) 10/11/2021   Medicare Annual Wellness (AWV)  05/02/2022    Colorectal cancer screening: Type of screening: Colonoscopy. Completed 08/23/2015. Repeat every 10 years  Mammogram status: Completed 05/07/2021. Repeat every year  Bone Density status: n/a  Lung Cancer Screening: (Low Dose CT Chest recommended if Age 58-80 years, 30 pack-year currently smoking OR have quit w/in 15years.) does not qualify.   Lung Cancer Screening Referral: no  Additional Screening:  Hepatitis C Screening: does qualify; Completed 07/04/2020  Vision Screening: Recommended annual ophthalmology exams for early detection of glaucoma and other disorders of the eye. Is the patient up to date with their annual eye exam?  Yes  Who is the provider or what is the name of the office in which the patient attends annual eye exams? Dr. Harlon Flor If pt is  not established with a provider, would they like to be referred to a provider to establish care? No .   Dental Screening: Recommended annual dental exams for proper oral hygiene  Community Resource Referral / Chronic Care Management: CRR required this visit?  No   CCM required this visit?  No      Plan:     I have personally reviewed and noted the following in the patient's chart:   Medical and social history Use of alcohol, tobacco or illicit drugs  Current medications and supplements including opioid prescriptions. Patient is not currently taking opioid prescriptions. Functional ability and status Nutritional status Physical activity Advanced directives List of other physicians Hospitalizations, surgeries, and ER visits in previous 12 months Vitals Screenings to include cognitive, depression, and falls Referrals and appointments  In addition, I have reviewed and discussed with patient certain preventive protocols, quality metrics, and best practice recommendations. A written personalized care plan for preventive services as well as general preventive health recommendations were provided to patient.     Barb Merino, LPN   1/61/0960   Nurse Notes: none

## 2022-05-22 DIAGNOSIS — I890 Lymphedema, not elsewhere classified: Secondary | ICD-10-CM | POA: Diagnosis not present

## 2022-05-22 LAB — CMP14+EGFR
ALT: 27 IU/L (ref 0–32)
AST: 28 IU/L (ref 0–40)
Albumin/Globulin Ratio: 1.6 (ref 1.2–2.2)
Albumin: 4 g/dL (ref 3.8–4.9)
Alkaline Phosphatase: 73 IU/L (ref 44–121)
BUN/Creatinine Ratio: 19 (ref 9–23)
BUN: 18 mg/dL (ref 6–24)
Bilirubin Total: <0.2 mg/dL (ref 0.0–1.2)
CO2: 23 mmol/L (ref 20–29)
Calcium: 8.8 mg/dL (ref 8.7–10.2)
Chloride: 104 mmol/L (ref 96–106)
Creatinine, Ser: 0.97 mg/dL (ref 0.57–1.00)
Globulin, Total: 2.5 g/dL (ref 1.5–4.5)
Glucose: 84 mg/dL (ref 70–99)
Potassium: 4.5 mmol/L (ref 3.5–5.2)
Sodium: 138 mmol/L (ref 134–144)
Total Protein: 6.5 g/dL (ref 6.0–8.5)
eGFR: 68 mL/min/{1.73_m2} (ref >59)

## 2022-05-22 LAB — TSH+FREE T4
Free T4: 1.04 ng/dL (ref 0.82–1.77)
TSH: 0.94 u[IU]/mL (ref 0.450–4.500)

## 2022-06-04 DIAGNOSIS — H40012 Open angle with borderline findings, low risk, left eye: Secondary | ICD-10-CM | POA: Diagnosis not present

## 2022-06-04 DIAGNOSIS — H16223 Keratoconjunctivitis sicca, not specified as Sjogren's, bilateral: Secondary | ICD-10-CM | POA: Diagnosis not present

## 2022-06-04 DIAGNOSIS — H401131 Primary open-angle glaucoma, bilateral, mild stage: Secondary | ICD-10-CM | POA: Diagnosis not present

## 2022-06-10 ENCOUNTER — Other Ambulatory Visit: Payer: Self-pay | Admitting: Internal Medicine

## 2022-06-10 DIAGNOSIS — M5136 Other intervertebral disc degeneration, lumbar region: Secondary | ICD-10-CM | POA: Diagnosis not present

## 2022-06-10 DIAGNOSIS — M179 Osteoarthritis of knee, unspecified: Secondary | ICD-10-CM | POA: Diagnosis not present

## 2022-06-10 DIAGNOSIS — R202 Paresthesia of skin: Secondary | ICD-10-CM | POA: Diagnosis not present

## 2022-06-10 DIAGNOSIS — Z5181 Encounter for therapeutic drug level monitoring: Secondary | ICD-10-CM | POA: Diagnosis not present

## 2022-06-10 DIAGNOSIS — M5430 Sciatica, unspecified side: Secondary | ICD-10-CM | POA: Diagnosis not present

## 2022-06-10 DIAGNOSIS — Z79891 Long term (current) use of opiate analgesic: Secondary | ICD-10-CM | POA: Diagnosis not present

## 2022-06-10 DIAGNOSIS — M62830 Muscle spasm of back: Secondary | ICD-10-CM | POA: Diagnosis not present

## 2022-06-10 DIAGNOSIS — G8929 Other chronic pain: Secondary | ICD-10-CM | POA: Diagnosis not present

## 2022-06-19 ENCOUNTER — Encounter: Payer: Self-pay | Admitting: Internal Medicine

## 2022-06-19 DIAGNOSIS — Z1231 Encounter for screening mammogram for malignant neoplasm of breast: Secondary | ICD-10-CM

## 2022-06-20 ENCOUNTER — Other Ambulatory Visit: Payer: Self-pay | Admitting: Internal Medicine

## 2022-06-20 DIAGNOSIS — Z1231 Encounter for screening mammogram for malignant neoplasm of breast: Secondary | ICD-10-CM

## 2022-06-21 DIAGNOSIS — I890 Lymphedema, not elsewhere classified: Secondary | ICD-10-CM | POA: Diagnosis not present

## 2022-06-23 ENCOUNTER — Other Ambulatory Visit: Payer: Self-pay | Admitting: Internal Medicine

## 2022-07-02 ENCOUNTER — Ambulatory Visit
Admission: RE | Admit: 2022-07-02 | Discharge: 2022-07-02 | Disposition: A | Discharge status: Home / Self Care | Payer: Medicare HMO | Source: Ambulatory Visit | Attending: Internal Medicine | Admitting: Internal Medicine

## 2022-07-02 DIAGNOSIS — Z1231 Encounter for screening mammogram for malignant neoplasm of breast: Secondary | ICD-10-CM | POA: Diagnosis not present

## 2022-07-03 ENCOUNTER — Encounter: Payer: Self-pay | Admitting: Hematology & Oncology

## 2022-07-03 ENCOUNTER — Other Ambulatory Visit: Payer: Self-pay

## 2022-07-03 ENCOUNTER — Inpatient Hospital Stay: Payer: Medicare HMO | Attending: Hematology & Oncology

## 2022-07-03 ENCOUNTER — Inpatient Hospital Stay: Payer: Medicare HMO | Admitting: Hematology & Oncology

## 2022-07-03 VITALS — BP 114/73 | HR 78 | Temp 98.9°F | Resp 18 | Ht 64.0 in | Wt 241.8 lb

## 2022-07-03 DIAGNOSIS — D500 Iron deficiency anemia secondary to blood loss (chronic): Secondary | ICD-10-CM

## 2022-07-03 DIAGNOSIS — D508 Other iron deficiency anemias: Secondary | ICD-10-CM

## 2022-07-03 DIAGNOSIS — D509 Iron deficiency anemia, unspecified: Secondary | ICD-10-CM | POA: Insufficient documentation

## 2022-07-03 DIAGNOSIS — K912 Postsurgical malabsorption, not elsewhere classified: Secondary | ICD-10-CM | POA: Insufficient documentation

## 2022-07-03 DIAGNOSIS — Z9884 Bariatric surgery status: Secondary | ICD-10-CM | POA: Insufficient documentation

## 2022-07-03 LAB — CBC WITH DIFFERENTIAL (CANCER CENTER ONLY)
Abs Immature Granulocytes: 0.01 10*3/uL (ref 0.00–0.07)
Basophils Absolute: 0 10*3/uL (ref 0.0–0.1)
Basophils Relative: 1 %
Eosinophils Absolute: 0.1 10*3/uL (ref 0.0–0.5)
Eosinophils Relative: 3 %
HCT: 32.1 % — ABNORMAL LOW (ref 36.0–46.0)
Hemoglobin: 10.1 g/dL — ABNORMAL LOW (ref 12.0–15.0)
Immature Granulocytes: 0 %
Lymphocytes Relative: 38 %
Lymphs Abs: 1.5 10*3/uL (ref 0.7–4.0)
MCH: 33.4 pg (ref 26.0–34.0)
MCHC: 31.5 g/dL (ref 30.0–36.0)
MCV: 106.3 fL — ABNORMAL HIGH (ref 80.0–100.0)
Monocytes Absolute: 0.3 10*3/uL (ref 0.1–1.0)
Monocytes Relative: 8 %
Neutro Abs: 2 10*3/uL (ref 1.7–7.7)
Neutrophils Relative %: 50 %
Platelet Count: 299 10*3/uL (ref 150–400)
RBC: 3.02 MIL/uL — ABNORMAL LOW (ref 3.87–5.11)
RDW: 12.5 % (ref 11.5–15.5)
WBC Count: 3.9 10*3/uL — ABNORMAL LOW (ref 4.0–10.5)
nRBC: 0 % (ref 0.0–0.2)

## 2022-07-03 LAB — IRON AND IRON BINDING CAPACITY (CC-WL,HP ONLY)
Iron: 69 ug/dL (ref 28–170)
Saturation Ratios: 29 % (ref 10.4–31.8)
TIBC: 242 ug/dL — ABNORMAL LOW (ref 250–450)
UIBC: 173 ug/dL (ref 148–442)

## 2022-07-03 LAB — VITAMIN B12: Vitamin B-12: 1395 pg/mL — ABNORMAL HIGH (ref 180–914)

## 2022-07-03 LAB — RETICULOCYTES
Immature Retic Fract: 15.4 % (ref 2.3–15.9)
RBC.: 3.02 MIL/uL — ABNORMAL LOW (ref 3.87–5.11)
Retic Count, Absolute: 47.1 10*3/uL (ref 19.0–186.0)
Retic Ct Pct: 1.6 % (ref 0.4–3.1)

## 2022-07-03 LAB — CMP (CANCER CENTER ONLY)
ALT: 22 U/L (ref 0–44)
AST: 22 U/L (ref 15–41)
Albumin: 4 g/dL (ref 3.5–5.0)
Alkaline Phosphatase: 64 U/L (ref 38–126)
Anion gap: 5 (ref 5–15)
BUN: 15 mg/dL (ref 6–20)
CO2: 27 mmol/L (ref 22–32)
Calcium: 9.1 mg/dL (ref 8.9–10.3)
Chloride: 109 mmol/L (ref 98–111)
Creatinine: 0.97 mg/dL (ref 0.44–1.00)
GFR, Estimated: >60 mL/min (ref >60)
Glucose, Bld: 91 mg/dL (ref 70–99)
Potassium: 4.3 mmol/L (ref 3.5–5.1)
Sodium: 141 mmol/L (ref 135–145)
Total Bilirubin: 0.3 mg/dL (ref 0.3–1.2)
Total Protein: 6.4 g/dL — ABNORMAL LOW (ref 6.5–8.1)

## 2022-07-03 LAB — FERRITIN: Ferritin: 77 ng/mL (ref 11–307)

## 2022-07-03 LAB — FOLATE: Folate: >40 ng/mL (ref >5.9)

## 2022-07-03 NOTE — Progress Notes (Signed)
Hematology and Oncology Follow Up Visit  Natalie Guerrero [id number] 1965/07/13 57 y.o. 07/03/2022   Principle Diagnosis:  Iron deficiency anemia secondary to malabsorption after gastric bypass (2007)   Current Therapy:        IV iron as indicated  --Venofer given on 04/30/2022   Interim History:  Natalie Guerrero is here today for follow up.  Is certainly nice meeting her.  She is very charming.  She has been followed by our wonderful Nurse practitioners.  She seems to be doing okay although she does feel somewhat tired.  She has had no obvious issues with bleeding.  When we last saw her, her ferritin was 21 with an iron saturation of 24%.  She did get some IV iron.  This did make her feel okay.  She does feel little more tired right now.  She has had no cough or shortness of breath.  She has had no nausea or vomiting.  She has had no leg swelling.  There is been no rashes.  She has had no obvious bleeding.  She does have a history of gastric bypass.  Currently, I would say that her performance status is probably ECOG 1.  Wt Readings from Last 3 Encounters:  07/03/22 241 lb 12.8 oz (109.7 kg)  05/21/22 238 lb (108 kg)  05/21/22 238 lb 6.4 oz (108.1 kg)    Medications:  Allergies as of 07/03/2022       Reactions   Codeine Itching        Medication List        Accurate as of Jul 03, 2022 11:02 AM. If you have any questions, ask your nurse or doctor.          STOP taking these medications    imipramine 25 MG tablet Commonly known as: TOFRANIL Stopped by: Josph Macho, MD   NON FORMULARY Stopped by: Josph Macho, MD   scopolamine 1 MG/3DAYS Commonly known as: Transderm Scop (1.5 MG) Stopped by: Josph Macho, MD       TAKE these medications    b complex vitamins capsule Take 1 capsule by mouth daily.   brimonidine 0.2 % ophthalmic solution Commonly known as: ALPHAGAN Place 1 drop into the right eye 2 (two) times daily.   buPROPion  150 MG 24 hr tablet Commonly known as: WELLBUTRIN XL TAKE 1 TABLET BY MOUTH EVERY DAY   cetirizine 10 MG tablet Commonly known as: ZYRTEC Take 10 mg by mouth daily.   Collagen Hydrolysate Powd by Does not apply route.   docusate sodium 100 MG capsule Commonly known as: COLACE Take 200 mg by mouth 2 (two) times daily.   FIBER PO Take 4 capsules by mouth daily. prn   folic acid 400 MCG tablet Commonly known as: FOLVITE Take 400 mcg by mouth daily.   gabapentin 300 MG capsule Commonly known as: NEURONTIN Take 300 mg by mouth 3 (three) times daily.   levothyroxine 100 MCG tablet Commonly known as: SYNTHROID TAKE ONE TABLET M-F AND TAKE 1/2 PILL ON SATURDAY/SUNDAY.   Magnesium 200 MG Tabs Take 4 tablets by mouth daily.   montelukast 10 MG tablet Commonly known as: SINGULAIR TAKE 1 TABLET BY MOUTH EVERY DAY   Mucinex DM Maximum Strength 60-1200 MG Tb12 Take 1 tablet by mouth 2 (two) times daily.   naloxone 4 MG/0.1ML Liqd nasal spray kit Commonly known as: NARCAN See admin instructions.   Norel AD 4-10-325 MG Tabs Generic drug: Chlorphen-PE-Acetaminophen TAKE 1 TABLET  BY MOUTH TWICE A DAY AS NEEDED   omeprazole 40 MG capsule Commonly known as: PRILOSEC TAKE 1 CAPSULE BY MOUTH EVERY DAY   Oxycodone HCl 20 MG Tabs SMARTSIG:0.5-1 Tablet(s) By Mouth 4-6 Times Daily   tiZANidine 4 MG tablet Commonly known as: ZANAFLEX every 4 (four) hours as needed.   topiramate 100 MG tablet Commonly known as: TOPAMAX Take 100 mg by mouth 4 (four) times daily.   UNABLE TO FIND Med Name: Beet Chews take 2 chews daily   valACYclovir 500 MG tablet Commonly known as: VALTREX as needed.   Vitamin D3 50 MCG (2000 UT) capsule Take 2,000 Units by mouth daily.   ZINC GUMMY PO Take by mouth.        Allergies:  Allergies  Allergen Reactions   Codeine Itching    Past Medical History, Surgical history, Social history, and Family History were reviewed and  updated.  Review of Systems: All other 10 point review of systems is negative.   Physical Exam:  height is 5\' 4"  (1.626 m) and weight is 241 lb 12.8 oz (109.7 kg). Her oral temperature is 98.9 F (37.2 C). Her blood pressure is 114/73 and her pulse is 78. Her respiration is 18 and oxygen saturation is 100%.   Wt Readings from Last 3 Encounters:  07/03/22 241 lb 12.8 oz (109.7 kg)  05/21/22 238 lb (108 kg)  05/21/22 238 lb 6.4 oz (108.1 kg)    Ocular: Sclerae with mild pallor, pupils equal, round and reactive to light Ear-nose-throat: Oropharynx clear, dentition fair Lymphatic: No cervical or supraclavicular adenopathy Lungs no rales or rhonchi, good excursion bilaterally Heart regular rate and rhythm, no murmur appreciated Neuro: non-focal, well-oriented, appropriate affect  Lab Results  Component Value Date   WBC 3.9 (L) 07/03/2022   HGB 10.1 (L) 07/03/2022   HCT 32.1 (L) 07/03/2022   MCV 106.3 (H) 07/03/2022   PLT 299 07/03/2022   Lab Results  Component Value Date   FERRITIN 21 04/03/2022   IRON 65 04/03/2022   TIBC 273 04/03/2022   UIBC 208 04/03/2022   IRONPCTSAT 24 04/03/2022   Lab Results  Component Value Date   RETICCTPCT 1.6 07/03/2022   RBC 3.02 (L) 07/03/2022   RETICCTABS 76.62 12/08/2012   No results found for: "KPAFRELGTCHN", "LAMBDASER", "[facility]" Lab Results  Component Value Date   IGGSERUM 1,206 03/28/2021   IGMSERUM 181 03/28/2021   No results found for: "TOTALPROTELP", "ALBUMINELP", "A1GS", "A2GS", "BETS", "BETA2SER", "GAMS", "MSPIKE", "SPEI"   Chemistry      Component Value Date/Time   NA 138 05/21/2022 1231   NA 137 01/16/2017 1502   NA 141 11/10/2012 1050   K 4.5 05/21/2022 1231   K 3.7 01/16/2017 1502   K 3.8 11/10/2012 1050   CL 104 05/21/2022 1231   CL 98 01/16/2017 1502   CO2 23 05/21/2022 1231   CO2 25 01/16/2017 1502   CO2 28 11/10/2012 1050   BUN 18 05/21/2022 1231   BUN 14 01/16/2017 1502   BUN 7.5 11/10/2012 1050    CREATININE 0.97 05/21/2022 1231   CREATININE 1.1 01/16/2017 1502   CREATININE 0.8 11/10/2012 1050      Component Value Date/Time   CALCIUM 8.8 05/21/2022 1231   CALCIUM 8.8 01/16/2017 1502   CALCIUM 9.0 11/10/2012 1050   ALKPHOS 73 05/21/2022 1231   ALKPHOS 84 01/16/2017 1502   ALKPHOS 66 11/10/2012 1050   AST 28 05/21/2022 1231   AST 27 01/16/2017 1502   AST 34  11/10/2012 1050   ALT 27 05/21/2022 1231   ALT 23 01/16/2017 1502   ALT 30 11/10/2012 1050   BILITOT <0.2 05/21/2022 1231   BILITOT 0.60 01/16/2017 1502   BILITOT 0.57 11/10/2012 1050      No diagnosis found.   Impression and Plan: Ms. Worman is a very pleasant 57 yo African American female with iron deficiency anemia secondary to malabsorption since having her gastric bypass surgery in 2007.   I am a little bit troubled by the fact that her hemoglobin is little bit lower.  As such, I just wonder if she has another issue.  It is possible that she may have a low erythropoietin level.  We will check this.  Her iron studies look that bad.  Her ferritin is 77 with an iron saturation of 29%.  I think that if he does have a low erythropoietin level, we probably need to see about using ESA on her.   We will have her come back a lot sooner.  I would like to have her come back probably sometime in June and we will see how she is doing.  Josph Macho, MD 5/23/202411:02 AM

## 2022-07-04 ENCOUNTER — Telehealth: Payer: Self-pay | Admitting: *Deleted

## 2022-07-04 ENCOUNTER — Encounter: Payer: Self-pay | Admitting: Family

## 2022-07-04 NOTE — Telephone Encounter (Signed)
-----   Message from Josph Macho, MD sent at 07/04/2022  3:47 PM EDT ----- Please call and let her know that the iron levels actually look quite good.  I think the key will be this erythropoietin level.  I will get this back next week.  Thanks.  Cindee Lame

## 2022-07-04 NOTE — Telephone Encounter (Signed)
Mychart msg sent to pt regarding iron results.

## 2022-07-05 LAB — ERYTHROPOIETIN: Erythropoietin: 27.9 m[IU]/mL — ABNORMAL HIGH (ref 2.6–18.5)

## 2022-07-09 DIAGNOSIS — Z79891 Long term (current) use of opiate analgesic: Secondary | ICD-10-CM | POA: Diagnosis not present

## 2022-07-09 DIAGNOSIS — G894 Chronic pain syndrome: Secondary | ICD-10-CM | POA: Diagnosis not present

## 2022-07-09 DIAGNOSIS — F1910 Other psychoactive substance abuse, uncomplicated: Secondary | ICD-10-CM | POA: Diagnosis not present

## 2022-07-09 DIAGNOSIS — M5430 Sciatica, unspecified side: Secondary | ICD-10-CM | POA: Diagnosis not present

## 2022-07-09 DIAGNOSIS — Z5181 Encounter for therapeutic drug level monitoring: Secondary | ICD-10-CM | POA: Diagnosis not present

## 2022-07-09 DIAGNOSIS — M62830 Muscle spasm of back: Secondary | ICD-10-CM | POA: Diagnosis not present

## 2022-07-09 DIAGNOSIS — R202 Paresthesia of skin: Secondary | ICD-10-CM | POA: Diagnosis not present

## 2022-07-09 DIAGNOSIS — M5136 Other intervertebral disc degeneration, lumbar region: Secondary | ICD-10-CM | POA: Diagnosis not present

## 2022-07-09 DIAGNOSIS — G8929 Other chronic pain: Secondary | ICD-10-CM | POA: Diagnosis not present

## 2022-07-09 DIAGNOSIS — M179 Osteoarthritis of knee, unspecified: Secondary | ICD-10-CM | POA: Diagnosis not present

## 2022-07-22 DIAGNOSIS — I890 Lymphedema, not elsewhere classified: Secondary | ICD-10-CM | POA: Diagnosis not present

## 2022-07-23 DIAGNOSIS — M47816 Spondylosis without myelopathy or radiculopathy, lumbar region: Secondary | ICD-10-CM | POA: Diagnosis not present

## 2022-08-01 DIAGNOSIS — M1711 Unilateral primary osteoarthritis, right knee: Secondary | ICD-10-CM | POA: Diagnosis not present

## 2022-08-04 ENCOUNTER — Encounter: Payer: Medicare HMO | Admitting: Internal Medicine

## 2022-08-04 DIAGNOSIS — M1712 Unilateral primary osteoarthritis, left knee: Secondary | ICD-10-CM | POA: Diagnosis not present

## 2022-08-04 DIAGNOSIS — K219 Gastro-esophageal reflux disease without esophagitis: Secondary | ICD-10-CM | POA: Diagnosis not present

## 2022-08-04 DIAGNOSIS — Z1211 Encounter for screening for malignant neoplasm of colon: Secondary | ICD-10-CM | POA: Diagnosis not present

## 2022-08-04 DIAGNOSIS — K5900 Constipation, unspecified: Secondary | ICD-10-CM | POA: Diagnosis not present

## 2022-08-04 NOTE — Progress Notes (Deleted)
I,Avya Flavell T Bartt Gonzaga,acting as a scribe for [name], MD.,have documented all relevant documentation on the behalf of [name], MD,as directed by  [name], MD while in the presence of [name], MD.  Subjective:    Patient ID: Natalie Guerrero , female    DOB: 19-Sep-1965 , 57 y.o.   MRN: [id number]  No chief complaint on file.   HPI  She is here today for a full physical exam. She is followed by GYN, Dr. Richardson Dopp for her pelvic exams. She recently had pelvic ultrasound for further evaluation of pelvic pain.   Patient stated she thinks she has a sinus infection. She has a headache, runny nose and postnasal drip. She reports she is using cough drops to help soothe her throat. She has tried Zyrtec with some relief of her sx. She is also taking Mucinex daily.   Thyroid Problem Presents for follow-up visit. Patient reports no cold intolerance, constipation, menstrual problem, nail problem, palpitations or tremors.     Past Medical History:  Diagnosis Date   Allergy 1985   Anxiety    Arthritis    DDD (degenerative disc disease)    Depression    GERD (gastroesophageal reflux disease) 2019   Glaucoma 2023   Heart murmur 09-2019   Hypothyroidism 12/08/2012   Iron deficiency anemia, unspecified 11/10/2012   Iron malabsorption 01/20/2017   Neuromuscular disorder (HCC) 2012   Since back surgery   Obesity    Thyroid disease hypothroidism     Family History  Problem Relation Age of Onset   Heart failure Mother    Anxiety disorder Mother    Arthritis Mother    Heart disease Mother    Hypertension Mother    Stroke Mother    Varicose Veins Mother    Prostate cancer Father    Alcohol abuse Father    Arthritis Father    Cancer Father    COPD Father    Depression Father    Hypertension Father    Obesity Father    Alcohol abuse Brother    Anxiety disorder Brother    Arthritis Brother    Drug abuse Brother    Hypertension Brother    Alcohol abuse  Brother    Anxiety disorder Sister    Obesity Sister    Obesity Brother    Colon cancer Neg Hx    Diabetes Neg Hx    Kidney disease Neg Hx    Liver disease Neg Hx    Neuropathy Neg Hx      Current Outpatient Medications:    b complex vitamins capsule, Take 1 capsule by mouth daily., Disp: , Rfl:    brimonidine (ALPHAGAN) 0.2 % ophthalmic solution, Place 1 drop into the right eye 2 (two) times daily., Disp: , Rfl:    buPROPion (WELLBUTRIN XL) 150 MG 24 hr tablet, TAKE 1 TABLET BY MOUTH EVERY DAY, Disp: 90 tablet, Rfl: 1   cetirizine (ZYRTEC) 10 MG tablet, Take 10 mg by mouth daily., Disp: , Rfl:    Cholecalciferol (VITAMIN D3) 50 MCG (2000 UT) capsule, Take 2,000 Units by mouth daily., Disp: , Rfl:    Collagen Hydrolysate POWD, by Does not apply route., Disp: , Rfl:    Dextromethorphan-guaiFENesin (MUCINEX DM MAXIMUM STRENGTH) 60-1200 MG TB12, Take 1 tablet by mouth 2 (two) times daily., Disp: , Rfl:    docusate sodium (COLACE) 100 MG capsule, Take 200 mg by mouth 2 (two) times daily. , Disp: , Rfl:  FIBER PO, Take 4 capsules by mouth daily. prn, Disp: , Rfl:    folic acid (FOLVITE) 400 MCG tablet, Take 400 mcg by mouth daily., Disp: , Rfl:    gabapentin (NEURONTIN) 300 MG capsule, Take 300 mg by mouth 3 (three) times daily., Disp: , Rfl:    levothyroxine (SYNTHROID) 100 MCG tablet, TAKE ONE TABLET M-F AND TAKE 1/2 PILL ON SATURDAY/SUNDAY., Disp: 90 tablet, Rfl: 0   Magnesium 200 MG TABS, Take 4 tablets by mouth daily., Disp: , Rfl:    montelukast (SINGULAIR) 10 MG tablet, TAKE 1 TABLET BY MOUTH EVERY DAY, Disp: 90 tablet, Rfl: 1   naloxone (NARCAN) nasal spray 4 mg/0.1 mL, See admin instructions. (Patient not taking: Reported on 07/03/2022), Disp: , Rfl:    NOREL AD 4-10-325 MG TABS, TAKE 1 TABLET BY MOUTH TWICE A DAY AS NEEDED, Disp: 20 tablet, Rfl: 0   omeprazole (PRILOSEC) 40 MG capsule, TAKE 1 CAPSULE BY MOUTH EVERY DAY, Disp: 90 capsule, Rfl: 1   Oxycodone HCl 20 MG TABS,  SMARTSIG:0.5-1 Tablet(s) By Mouth 4-6 Times Daily, Disp: , Rfl:    tiZANidine (ZANAFLEX) 4 MG tablet, every 4 (four) hours as needed., Disp: , Rfl:    topiramate (TOPAMAX) 100 MG tablet, Take 100 mg by mouth 4 (four) times daily. , Disp: , Rfl: 5   UNABLE TO FIND, Med Name: Beet Chews take 2 chews daily, Disp: , Rfl:    valACYclovir (VALTREX) 500 MG tablet, as needed., Disp: , Rfl:    Zinc Citrate (ZINC GUMMY PO), Take by mouth., Disp: , Rfl:   Current Facility-Administered Medications:    triamcinolone acetonide (KENALOG) 10 MG/ML injection 20 mg, 20 mg, Other, Once, Regal, Norman S, DPM   Allergies  Allergen Reactions   Codeine Itching      The patient states she uses {contraceptive methods:5051} for birth control. No LMP recorded. Patient has had a hysterectomy.. {Dysmenorrhea-menorrhagia:21918}. Negative for: breast discharge, breast lump(s), breast pain and breast self exam. Associated symptoms include abnormal vaginal bleeding. Pertinent negatives include abnormal bleeding (hematology), anxiety, decreased libido, depression, difficulty falling sleep, dyspareunia, history of infertility, nocturia, sexual dysfunction, sleep disturbances, urinary incontinence, urinary urgency, vaginal discharge and vaginal itching. Diet regular.The patient states her exercise level is    . The patient's tobacco use is:  Social History   Tobacco Use  Smoking Status Never  Smokeless Tobacco Never  Tobacco Comments   NA  . She has been exposed to passive smoke. The patient's alcohol use is:  Social History   Substance and Sexual Activity  Alcohol Use Not Currently  . Additional information: Last pap ***, next one scheduled for ***.    Review of Systems  Constitutional: Negative.   HENT: Negative.    Eyes: Negative.   Respiratory: Negative.    Cardiovascular: Negative.  Negative for palpitations.  Gastrointestinal: Negative.  Negative for constipation.  Endocrine: Negative.  Negative for cold  intolerance.  Genitourinary: Negative.  Negative for menstrual problem.  Musculoskeletal: Negative.   Skin: Negative.   Allergic/Immunologic: Negative.   Neurological: Negative.  Negative for tremors.  Hematological: Negative.   Psychiatric/Behavioral: Negative.       There were no vitals filed for this visit. There is no height or weight on file to calculate BMI.  Wt Readings from Last 3 Encounters:  07/03/22 241 lb 12.8 oz (109.7 kg)  05/21/22 238 lb (108 kg)  05/21/22 238 lb 6.4 oz (108.1 kg)     Objective:  Physical Exam  Assessment And Plan:     1. Encounter for general adult medical examination w/o abnormal findings  2. Primary hypothyroidism  3. Moderate episode of recurrent major depressive disorder (HCC)    No follow-ups on file. Patient was given opportunity to ask questions. Patient verbalized understanding of the plan and was able to repeat key elements of the plan. All questions were answered to their satisfaction.   [name], MD  I, [name], MD, have reviewed all documentation for this visit. The documentation on 08/04/22 for the exam, diagnosis, procedures, and orders are all accurate and complete.

## 2022-08-05 DIAGNOSIS — R202 Paresthesia of skin: Secondary | ICD-10-CM | POA: Diagnosis not present

## 2022-08-05 DIAGNOSIS — G8929 Other chronic pain: Secondary | ICD-10-CM | POA: Diagnosis not present

## 2022-08-05 DIAGNOSIS — M5430 Sciatica, unspecified side: Secondary | ICD-10-CM | POA: Diagnosis not present

## 2022-08-05 DIAGNOSIS — G894 Chronic pain syndrome: Secondary | ICD-10-CM | POA: Diagnosis not present

## 2022-08-05 DIAGNOSIS — M5136 Other intervertebral disc degeneration, lumbar region: Secondary | ICD-10-CM | POA: Diagnosis not present

## 2022-08-05 DIAGNOSIS — M62830 Muscle spasm of back: Secondary | ICD-10-CM | POA: Diagnosis not present

## 2022-08-05 DIAGNOSIS — M179 Osteoarthritis of knee, unspecified: Secondary | ICD-10-CM | POA: Diagnosis not present

## 2022-08-07 ENCOUNTER — Other Ambulatory Visit: Payer: Self-pay | Admitting: Hematology & Oncology

## 2022-08-07 ENCOUNTER — Inpatient Hospital Stay: Payer: Medicare HMO | Attending: Hematology & Oncology

## 2022-08-07 ENCOUNTER — Inpatient Hospital Stay: Payer: Medicare HMO

## 2022-08-07 ENCOUNTER — Encounter: Payer: Self-pay | Admitting: Hematology & Oncology

## 2022-08-07 ENCOUNTER — Encounter: Payer: Self-pay | Admitting: *Deleted

## 2022-08-07 ENCOUNTER — Inpatient Hospital Stay: Payer: Medicare HMO | Admitting: Hematology & Oncology

## 2022-08-07 VITALS — BP 127/76 | HR 84 | Temp 98.8°F | Resp 20 | Ht 64.0 in | Wt 244.1 lb

## 2022-08-07 DIAGNOSIS — N189 Chronic kidney disease, unspecified: Secondary | ICD-10-CM | POA: Insufficient documentation

## 2022-08-07 DIAGNOSIS — D631 Anemia in chronic kidney disease: Secondary | ICD-10-CM | POA: Diagnosis not present

## 2022-08-07 DIAGNOSIS — D500 Iron deficiency anemia secondary to blood loss (chronic): Secondary | ICD-10-CM | POA: Diagnosis not present

## 2022-08-07 DIAGNOSIS — D509 Iron deficiency anemia, unspecified: Secondary | ICD-10-CM | POA: Insufficient documentation

## 2022-08-07 HISTORY — DX: Anemia in chronic kidney disease: D63.1

## 2022-08-07 LAB — IRON AND IRON BINDING CAPACITY (CC-WL,HP ONLY)
Iron: 108 ug/dL (ref 28–170)
Saturation Ratios: 40 % — ABNORMAL HIGH (ref 10.4–31.8)
TIBC: 270 ug/dL (ref 250–450)
UIBC: 162 ug/dL (ref 148–442)

## 2022-08-07 LAB — CMP (CANCER CENTER ONLY)
ALT: 33 U/L (ref 0–44)
AST: 35 U/L (ref 15–41)
Albumin: 3.7 g/dL (ref 3.5–5.0)
Alkaline Phosphatase: 69 U/L (ref 38–126)
Anion gap: 6 (ref 5–15)
BUN: 14 mg/dL (ref 6–20)
CO2: 26 mmol/L (ref 22–32)
Calcium: 8.7 mg/dL — ABNORMAL LOW (ref 8.9–10.3)
Chloride: 104 mmol/L (ref 98–111)
Creatinine: 0.88 mg/dL (ref 0.44–1.00)
GFR, Estimated: >60 mL/min (ref >60)
Glucose, Bld: 84 mg/dL (ref 70–99)
Potassium: 4.2 mmol/L (ref 3.5–5.1)
Sodium: 136 mmol/L (ref 135–145)
Total Bilirubin: 0.3 mg/dL (ref 0.3–1.2)
Total Protein: 7.2 g/dL (ref 6.5–8.1)

## 2022-08-07 LAB — CBC WITH DIFFERENTIAL (CANCER CENTER ONLY)
Abs Immature Granulocytes: 0.03 10*3/uL (ref 0.00–0.07)
Basophils Absolute: 0 10*3/uL (ref 0.0–0.1)
Basophils Relative: 1 %
Eosinophils Absolute: 0.1 10*3/uL (ref 0.0–0.5)
Eosinophils Relative: 3 %
HCT: 33.9 % — ABNORMAL LOW (ref 36.0–46.0)
Hemoglobin: 10.6 g/dL — ABNORMAL LOW (ref 12.0–15.0)
Immature Granulocytes: 1 %
Lymphocytes Relative: 48 %
Lymphs Abs: 2.2 10*3/uL (ref 0.7–4.0)
MCH: 33 pg (ref 26.0–34.0)
MCHC: 31.3 g/dL (ref 30.0–36.0)
MCV: 105.6 fL — ABNORMAL HIGH (ref 80.0–100.0)
Monocytes Absolute: 0.3 10*3/uL (ref 0.1–1.0)
Monocytes Relative: 7 %
Neutro Abs: 1.8 10*3/uL (ref 1.7–7.7)
Neutrophils Relative %: 40 %
Platelet Count: 323 10*3/uL (ref 150–400)
RBC: 3.21 MIL/uL — ABNORMAL LOW (ref 3.87–5.11)
RDW: 12.1 % (ref 11.5–15.5)
WBC Count: 4.5 10*3/uL (ref 4.0–10.5)
nRBC: 0 % (ref 0.0–0.2)

## 2022-08-07 LAB — FERRITIN: Ferritin: 71 ng/mL (ref 11–307)

## 2022-08-07 LAB — RETICULOCYTES
Immature Retic Fract: 12.3 % (ref 2.3–15.9)
RBC.: 3.24 MIL/uL — ABNORMAL LOW (ref 3.87–5.11)
Retic Count, Absolute: 59.6 10*3/uL (ref 19.0–186.0)
Retic Ct Pct: 1.8 % (ref 0.4–3.1)

## 2022-08-07 NOTE — Progress Notes (Signed)
Hematology and Oncology Follow Up Visit  Natalie Guerrero [id number] May 30, 1965 57 y.o. 08/07/2022   Principle Diagnosis:  Iron deficiency anemia Erythropoietin deficiency anemia  Current Therapy:   IV iron-Feraheme given on 04/30/2022 Aranesp 300 mcg subcu for hemoglobin less than 11     Interim History:  Natalie Guerrero is back for follow-up.  This is her second office visit.  We first saw her back in May.  At that time, she still was slightly anemic.  When we saw her, her ferritin was 77 with an iron saturation of 29%.  I did find that her erythropoietin level is on the lower side.  Her erythropoietin level was only 28.  We checked her vitamin B12 level.  This was 1400.  She is feeling okay.  She wants to take folic acid.  I told her that would certainly be okay by me.  She has had no problems with bleeding.  She is on disability so she really is not able to do a whole lot.  She is eating okay.  She is having no problems with nausea or vomiting.  There is been no cough or shortness of breath.  She has had no rashes.  There is been no fever.  Overall, I would have to say that her performance status is probably ECOG 1.  Medications:  Current Outpatient Medications:    b complex vitamins capsule, Take 1 capsule by mouth daily., Disp: , Rfl:    brimonidine (ALPHAGAN) 0.2 % ophthalmic solution, Place 1 drop into the right eye 2 (two) times daily., Disp: , Rfl:    buPROPion (WELLBUTRIN XL) 150 MG 24 hr tablet, TAKE 1 TABLET BY MOUTH EVERY DAY, Disp: 90 tablet, Rfl: 1   cetirizine (ZYRTEC) 10 MG tablet, Take 10 mg by mouth daily., Disp: , Rfl:    Cholecalciferol (VITAMIN D3) 50 MCG (2000 UT) capsule, Take 2,000 Units by mouth daily., Disp: , Rfl:    Collagen Hydrolysate POWD, by Does not apply route., Disp: , Rfl:    Dextromethorphan-guaiFENesin (MUCINEX DM MAXIMUM STRENGTH) 60-1200 MG TB12, Take 1 tablet by mouth 2 (two) times daily., Disp: , Rfl:    docusate sodium (COLACE)  100 MG capsule, Take 200 mg by mouth 2 (two) times daily. , Disp: , Rfl:    FIBER PO, Take 4 capsules by mouth daily. prn, Disp: , Rfl:    folic acid (FOLVITE) 400 MCG tablet, Take 400 mcg by mouth daily., Disp: , Rfl:    gabapentin (NEURONTIN) 300 MG capsule, Take 300 mg by mouth 3 (three) times daily., Disp: , Rfl:    ibuprofen (ADVIL) 800 MG tablet, Take 800 mg by mouth every 8 (eight) hours as needed., Disp: , Rfl:    imipramine (TOFRANIL) 50 MG tablet, 50 mg 2 (two) times daily., Disp: , Rfl:    levothyroxine (SYNTHROID) 100 MCG tablet, TAKE ONE TABLET M-F AND TAKE 1/2 PILL ON SATURDAY/SUNDAY., Disp: 90 tablet, Rfl: 0   Magnesium 200 MG TABS, Take 4 tablets by mouth daily., Disp: , Rfl:    montelukast (SINGULAIR) 10 MG tablet, TAKE 1 TABLET BY MOUTH EVERY DAY, Disp: 90 tablet, Rfl: 1   omeprazole (PRILOSEC) 40 MG capsule, TAKE 1 CAPSULE BY MOUTH EVERY DAY, Disp: 90 capsule, Rfl: 1   OVER THE COUNTER MEDICATION, Tea made with ginger root and tumeric root., Disp: , Rfl:    Oxycodone HCl 20 MG TABS, SMARTSIG:0.5-1 Tablet(s) By Mouth 4-6 Times Daily, Disp: , Rfl:    RESTASIS 0.05 %  ophthalmic emulsion, Place 1 drop into both eyes 2 (two) times daily., Disp: , Rfl:    tiZANidine (ZANAFLEX) 4 MG tablet, every 4 (four) hours as needed., Disp: , Rfl:    topiramate (TOPAMAX) 100 MG tablet, Take 100 mg by mouth 4 (four) times daily. , Disp: , Rfl: 5   UNABLE TO FIND, Med Name: Beet Chews take 2 chews daily, Disp: , Rfl:    Zinc Citrate (ZINC GUMMY PO), Take by mouth daily., Disp: , Rfl:    naloxone (NARCAN) nasal spray 4 mg/0.1 mL, See admin instructions. (Patient not taking: Reported on 07/03/2022), Disp: , Rfl:    NOREL AD 4-10-325 MG TABS, TAKE 1 TABLET BY MOUTH TWICE A DAY AS NEEDED (Patient not taking: Reported on 08/07/2022), Disp: 20 tablet, Rfl: 0   valACYclovir (VALTREX) 500 MG tablet, as needed. (Patient not taking: Reported on 08/07/2022), Disp: , Rfl:   Current Facility-Administered  Medications:    triamcinolone acetonide (KENALOG) 10 MG/ML injection 20 mg, 20 mg, Other, Once, Lenn Sink, DPM  Allergies:  Allergies  Allergen Reactions   Codeine Itching    Past Medical History, Surgical history, Social history, and Family History were reviewed and updated.  Review of Systems: Review of Systems  Constitutional: Negative.   HENT:  Negative.    Eyes: Negative.   Respiratory: Negative.    Cardiovascular: Negative.   Gastrointestinal: Negative.   Endocrine: Negative.   Genitourinary: Negative.    Musculoskeletal: Negative.   Skin: Negative.   Neurological: Negative.   Hematological: Negative.   Psychiatric/Behavioral: Negative.      Physical Exam:  height is 5\' 4"  (1.626 m) and weight is 244 lb 1.9 oz (110.7 kg). Her oral temperature is 98.8 F (37.1 C). Her blood pressure is 127/76 and her pulse is 84. Her respiration is 20 and oxygen saturation is 100%.   Wt Readings from Last 3 Encounters:  08/07/22 244 lb 1.9 oz (110.7 kg)  07/03/22 241 lb 12.8 oz (109.7 kg)  05/21/22 238 lb (108 kg)    Physical Exam Vitals reviewed.  HENT:     Head: Normocephalic and atraumatic.  Eyes:     Pupils: Pupils are equal, round, and reactive to light.  Cardiovascular:     Rate and Rhythm: Normal rate and regular rhythm.     Heart sounds: Normal heart sounds.  Pulmonary:     Effort: Pulmonary effort is normal.     Breath sounds: Normal breath sounds.  Abdominal:     General: Bowel sounds are normal.     Palpations: Abdomen is soft.  Musculoskeletal:        General: No tenderness or deformity. Normal range of motion.     Cervical back: Normal range of motion.  Lymphadenopathy:     Cervical: No cervical adenopathy.  Skin:    General: Skin is warm and dry.     Findings: No erythema or rash.  Neurological:     Mental Status: She is alert and oriented to person, place, and time.  Psychiatric:        Behavior: Behavior normal.        Thought Content:  Thought content normal.        Judgment: Judgment normal.      Lab Results  Component Value Date   WBC 4.5 08/07/2022   HGB 10.6 (L) 08/07/2022   HCT 33.9 (L) 08/07/2022   MCV 105.6 (H) 08/07/2022   PLT 323 08/07/2022     Chemistry  Component Value Date/Time   NA 136 08/07/2022 1020   NA 138 05/21/2022 1231   NA 137 01/16/2017 1502   NA 141 11/10/2012 1050   K 4.2 08/07/2022 1020   K 3.7 01/16/2017 1502   K 3.8 11/10/2012 1050   CL 104 08/07/2022 1020   CL 98 01/16/2017 1502   CO2 26 08/07/2022 1020   CO2 25 01/16/2017 1502   CO2 28 11/10/2012 1050   BUN 14 08/07/2022 1020   BUN 18 05/21/2022 1231   BUN 14 01/16/2017 1502   BUN 7.5 11/10/2012 1050   CREATININE 0.88 08/07/2022 1020   CREATININE 1.1 01/16/2017 1502   CREATININE 0.8 11/10/2012 1050      Component Value Date/Time   CALCIUM 8.7 (L) 08/07/2022 1020   CALCIUM 8.8 01/16/2017 1502   CALCIUM 9.0 11/10/2012 1050   ALKPHOS 69 08/07/2022 1020   ALKPHOS 84 01/16/2017 1502   ALKPHOS 66 11/10/2012 1050   AST 35 08/07/2022 1020   AST 34 11/10/2012 1050   ALT 33 08/07/2022 1020   ALT 23 01/16/2017 1502   ALT 30 11/10/2012 1050   BILITOT 0.3 08/07/2022 1020   BILITOT 0.57 11/10/2012 1050       Impression and Plan: Ms. Dingley is a very charming 57 year old African-American female.  She has multifactorial anemia.  Her hemoglobin is a little bit better today.  As such, we will hold on giving her Aranesp.  We will see what her iron studies look like.  I would like to give her 6 weeks.  Should be on folic acid.  We will see if this may help.   Josph Macho, MD 6/27/202411:35 AM

## 2022-08-11 ENCOUNTER — Other Ambulatory Visit: Payer: Self-pay | Admitting: Family

## 2022-08-12 DIAGNOSIS — M545 Low back pain, unspecified: Secondary | ICD-10-CM | POA: Diagnosis not present

## 2022-08-21 DIAGNOSIS — I890 Lymphedema, not elsewhere classified: Secondary | ICD-10-CM | POA: Diagnosis not present

## 2022-08-27 ENCOUNTER — Encounter: Payer: Self-pay | Admitting: Family Medicine

## 2022-08-27 ENCOUNTER — Other Ambulatory Visit: Payer: Self-pay | Admitting: Family Medicine

## 2022-08-27 ENCOUNTER — Ambulatory Visit: Payer: Medicare HMO | Admitting: Family Medicine

## 2022-08-27 VITALS — BP 112/80 | HR 82 | Temp 98.5°F | Ht 64.0 in | Wt 243.0 lb

## 2022-08-27 DIAGNOSIS — R051 Acute cough: Secondary | ICD-10-CM

## 2022-08-27 DIAGNOSIS — U071 COVID-19: Secondary | ICD-10-CM | POA: Diagnosis not present

## 2022-08-27 LAB — POC COVID19 BINAXNOW: SARS Coronavirus 2 Ag: POSITIVE — AB

## 2022-08-27 MED ORDER — BENZONATATE 100 MG PO CAPS
100.0000 mg | ORAL_CAPSULE | Freq: Three times a day (TID) | ORAL | 1 refills | Status: DC | PRN
Start: 2022-08-27 — End: 2023-01-14

## 2022-08-27 MED ORDER — MOLNUPIRAVIR EUA 200MG CAPSULE
4.0000 | ORAL_CAPSULE | Freq: Two times a day (BID) | ORAL | 0 refills | Status: AC
Start: 2022-08-27 — End: 2022-09-01

## 2022-08-27 MED ORDER — PAXLOVID (150/100) 10 X 150 MG & 10 X 100MG PO TBPK: 150.0000 | ORAL_TABLET | Freq: Two times a day (BID) | ORAL | 0 refills | Status: DC

## 2022-08-27 NOTE — Assessment & Plan Note (Addendum)
Advised to isolate for 5 days; take all meds as prescribed. Drink lots of fluid and rest.

## 2022-08-27 NOTE — Addendum Note (Signed)
Addended byMoshe Salisbury, Kinslee Dalpe E on: 08/27/2022 05:02 PM   Modules accepted: Orders, Level of Service

## 2022-08-27 NOTE — Assessment & Plan Note (Signed)
Take medication as directed.

## 2022-08-27 NOTE — Progress Notes (Signed)
I,Jameka J Llittleton, CMA,acting as a Neurosurgeon for [employer], NP.,have documented all relevant documentation on the behalf of Telly Broberg, NP,as directed by  Shandelle Borrelli Moshe Salisbury, NP while in the presence of Leonette Tischer, NP.  Subjective:  Patient ID: Natalie Guerrero , female    DOB: Dec 28, 1965 , 57 y.o.   MRN: [id number]  Chief Complaint  Patient presents with   URI    HPI  Patient presents today for cold symptoms. She reports having a cough, sore throat and runny nose, hoarseness, aches and fever all starting yesterday.Patient states that she in [place], [place] over the past weekend for a family reunion and she had family members from out of town , out of state in attendance.  URI  This is a new problem. The current episode started yesterday. The problem has been unchanged. There has been no fever. Associated symptoms include congestion, coughing and a sore throat. Pertinent negatives include no wheezing. She has tried nothing for the symptoms.     Past Medical History:  Diagnosis Date   Allergy 1985   Anxiety    Arthritis    DDD (degenerative disc disease)    Depression    Erythropoietin deficiency anemia 08/07/2022   GERD (gastroesophageal reflux disease) 2019   Glaucoma 2023   Heart murmur 09-2019   Hypothyroidism 12/08/2012   Iron deficiency anemia, unspecified 11/10/2012   Iron malabsorption 01/20/2017   Neuromuscular disorder (HCC) 2012   Since back surgery   Obesity    Thyroid disease hypothroidism     Family History  Problem Relation Age of Onset   Heart failure Mother    Anxiety disorder Mother    Arthritis Mother    Heart disease Mother    Hypertension Mother    Stroke Mother    Varicose Veins Mother    Prostate cancer Father    Alcohol abuse Father    Arthritis Father    Cancer Father    COPD Father    Depression Father    Hypertension Father    Obesity Father    Alcohol abuse Brother    Anxiety disorder Brother    Arthritis Brother    Drug abuse  Brother    Hypertension Brother    Alcohol abuse Brother    Anxiety disorder Sister    Obesity Sister    Obesity Brother    Colon cancer Neg Hx    Diabetes Neg Hx    Kidney disease Neg Hx    Liver disease Neg Hx    Neuropathy Neg Hx      Current Outpatient Medications:    benzonatate (TESSALON PERLES) 100 MG capsule, Take 1 capsule (100 mg total) by mouth 3 (three) times daily as needed for cough., Disp: 30 capsule, Rfl: 1   molnupiravir EUA (LAGEVRIO) 200 mg CAPS capsule, Take 4 capsules (800 mg total) by mouth 2 (two) times daily for 5 days., Disp: 40 capsule, Rfl: 0   b complex vitamins capsule, Take 1 capsule by mouth daily., Disp: , Rfl:    brimonidine (ALPHAGAN) 0.2 % ophthalmic solution, Place 1 drop into the right eye 2 (two) times daily., Disp: , Rfl:    buPROPion (WELLBUTRIN XL) 150 MG 24 hr tablet, TAKE 1 TABLET BY MOUTH EVERY DAY, Disp: 90 tablet, Rfl: 1   cetirizine (ZYRTEC) 10 MG tablet, Take 10 mg by mouth daily., Disp: , Rfl:    Cholecalciferol (VITAMIN D3) 50 MCG (2000 UT) capsule, Take 2,000 Units by mouth daily., Disp: , Rfl:  Collagen Hydrolysate POWD, by Does not apply route., Disp: , Rfl:    Dextromethorphan-guaiFENesin (MUCINEX DM MAXIMUM STRENGTH) 60-1200 MG TB12, Take 1 tablet by mouth 2 (two) times daily., Disp: , Rfl:    docusate sodium (COLACE) 100 MG capsule, Take 200 mg by mouth 2 (two) times daily. , Disp: , Rfl:    FIBER PO, Take 4 capsules by mouth daily. prn, Disp: , Rfl:    folic acid (FOLVITE) 400 MCG tablet, Take 400 mcg by mouth daily., Disp: , Rfl:    gabapentin (NEURONTIN) 300 MG capsule, Take 300 mg by mouth 3 (three) times daily., Disp: , Rfl:    ibuprofen (ADVIL) 800 MG tablet, Take 800 mg by mouth every 8 (eight) hours as needed., Disp: , Rfl:    imipramine (TOFRANIL) 50 MG tablet, 50 mg 2 (two) times daily., Disp: , Rfl:    levothyroxine (SYNTHROID) 100 MCG tablet, TAKE ONE TABLET M-F AND TAKE 1/2 PILL ON SATURDAY/SUNDAY., Disp: 90 tablet,  Rfl: 0   Magnesium 200 MG TABS, Take 4 tablets by mouth daily., Disp: , Rfl:    montelukast (SINGULAIR) 10 MG tablet, TAKE 1 TABLET BY MOUTH EVERY DAY, Disp: 90 tablet, Rfl: 1   naloxone (NARCAN) nasal spray 4 mg/0.1 mL, See admin instructions. (Patient not taking: Reported on 07/03/2022), Disp: , Rfl:    NOREL AD 4-10-325 MG TABS, TAKE 1 TABLET BY MOUTH TWICE A DAY AS NEEDED (Patient not taking: Reported on 08/07/2022), Disp: 20 tablet, Rfl: 0   omeprazole (PRILOSEC) 40 MG capsule, TAKE 1 CAPSULE BY MOUTH EVERY DAY, Disp: 90 capsule, Rfl: 1   OVER THE COUNTER MEDICATION, Tea made with ginger root and tumeric root., Disp: , Rfl:    Oxycodone HCl 20 MG TABS, SMARTSIG:0.5-1 Tablet(s) By Mouth 4-6 Times Daily, Disp: , Rfl:    RESTASIS 0.05 % ophthalmic emulsion, Place 1 drop into both eyes 2 (two) times daily., Disp: , Rfl:    tiZANidine (ZANAFLEX) 4 MG tablet, every 4 (four) hours as needed., Disp: , Rfl:    topiramate (TOPAMAX) 100 MG tablet, Take 100 mg by mouth 4 (four) times daily. , Disp: , Rfl: 5   UNABLE TO FIND, Med Name: Beet Chews take 2 chews daily, Disp: , Rfl:    valACYclovir (VALTREX) 500 MG tablet, as needed. (Patient not taking: Reported on 08/07/2022), Disp: , Rfl:    Zinc Citrate (ZINC GUMMY PO), Take by mouth daily., Disp: , Rfl:   Current Facility-Administered Medications:    triamcinolone acetonide (KENALOG) 10 MG/ML injection 20 mg, 20 mg, Other, Once, Regal, Kirstie Peri, DPM   Allergies  Allergen Reactions   Codeine Itching     Review of Systems  Constitutional:  Positive for chills.  HENT:  Positive for congestion, sore throat and voice change.   Respiratory:  Positive for cough. Negative for shortness of breath and wheezing.      Today's Vitals   08/27/22 1050  BP: 112/80  Pulse: 82  Temp: 98.5 F (36.9 C)  Weight: 243 lb (110.2 kg)  Height: 5\' 4"  (1.626 m)  PainSc: 4   PainLoc: Throat   Body mass index is 41.71 kg/m.  Wt Readings from Last 3 Encounters:   08/27/22 243 lb (110.2 kg)  08/07/22 244 lb 1.9 oz (110.7 kg)  07/03/22 241 lb 12.8 oz (109.7 kg)     Objective:  Physical Exam HENT:     Head: Normocephalic.  Cardiovascular:     Rate and Rhythm: Normal rate and  regular rhythm.  Musculoskeletal:        General: Normal range of motion.  Skin:    General: Skin is warm and dry.  Neurological:     General: No focal deficit present.     Mental Status: She is alert and oriented to person, place, and time.  Psychiatric:        Mood and Affect: Mood normal.         Assessment And Plan:  COVID-19 Assessment & Plan: Advised to isolate for 5 days; take all meds as prescribed. Drink lots of fluid and rest.  Orders: -     molnupiravir EUA; Take 4 capsules (800 mg total) by mouth 2 (two) times daily for 5 days.  Dispense: 40 capsule; Refill: 0  Acute cough Assessment & Plan: Take medication as directed  Orders: -     POC COVID-19 BinaxNow -     Benzonatate; Take 1 capsule (100 mg total) by mouth 3 (three) times daily as needed for cough.  Dispense: 30 capsule; Refill: 1     Return if symptoms worsen or fail to improve, for Keep appt.  Patient was given opportunity to ask questions. Patient verbalized understanding of the plan and was able to repeat key elements of the plan. All questions were answered to their satisfaction.  Arienna Benegas Moshe Salisbury, NP  I, Beauty Pless Moshe Salisbury, NP, have reviewed all documentation for this visit. The documentation on 08/27/22 for the exam, diagnosis, procedures, and orders are all accurate and complete.   IF YOU HAVE BEEN REFERRED TO A SPECIALIST, IT MAY TAKE 1-2 WEEKS TO SCHEDULE/PROCESS THE REFERRAL. IF YOU HAVE NOT HEARD FROM US/SPECIALIST IN TWO WEEKS, PLEASE GIVE [place] A CALL AT [phone].   THE PATIENT IS ENCOURAGED TO PRACTICE SOCIAL DISTANCING DUE TO THE COVID-19 PANDEMIC.

## 2022-08-28 ENCOUNTER — Other Ambulatory Visit: Payer: Self-pay | Admitting: Internal Medicine

## 2022-08-28 DIAGNOSIS — J309 Allergic rhinitis, unspecified: Secondary | ICD-10-CM

## 2022-09-02 DIAGNOSIS — G8929 Other chronic pain: Secondary | ICD-10-CM | POA: Diagnosis not present

## 2022-09-02 DIAGNOSIS — M62830 Muscle spasm of back: Secondary | ICD-10-CM | POA: Diagnosis not present

## 2022-09-02 DIAGNOSIS — R202 Paresthesia of skin: Secondary | ICD-10-CM | POA: Diagnosis not present

## 2022-09-02 DIAGNOSIS — M5430 Sciatica, unspecified side: Secondary | ICD-10-CM | POA: Diagnosis not present

## 2022-09-02 DIAGNOSIS — M179 Osteoarthritis of knee, unspecified: Secondary | ICD-10-CM | POA: Diagnosis not present

## 2022-09-02 DIAGNOSIS — M5136 Other intervertebral disc degeneration, lumbar region: Secondary | ICD-10-CM | POA: Diagnosis not present

## 2022-09-02 DIAGNOSIS — Z79891 Long term (current) use of opiate analgesic: Secondary | ICD-10-CM | POA: Diagnosis not present

## 2022-09-02 IMAGING — MG DIGITAL DIAGNOSTIC BILAT W/ TOMO W/ CAD
6 of 10 series · 6 of 30 positions shown · non-contrast
Comparison: Previous exam(s).

CLINICAL DATA: The patient had a recent right-sided lump which has
resolved. The patient had a left sided lump which has also resolved.
Nonfocal pain remains on the left.

EXAM:
DIGITAL DIAGNOSTIC BILATERAL MAMMOGRAM WITH TOMOSYNTHESIS AND CAD
TECHNIQUE: Bilateral digital diagnostic mammography and breast tomosynthesis
was performed. The images were evaluated with computer-aided
detection.

[R CC synth-2D (1 of 2)]
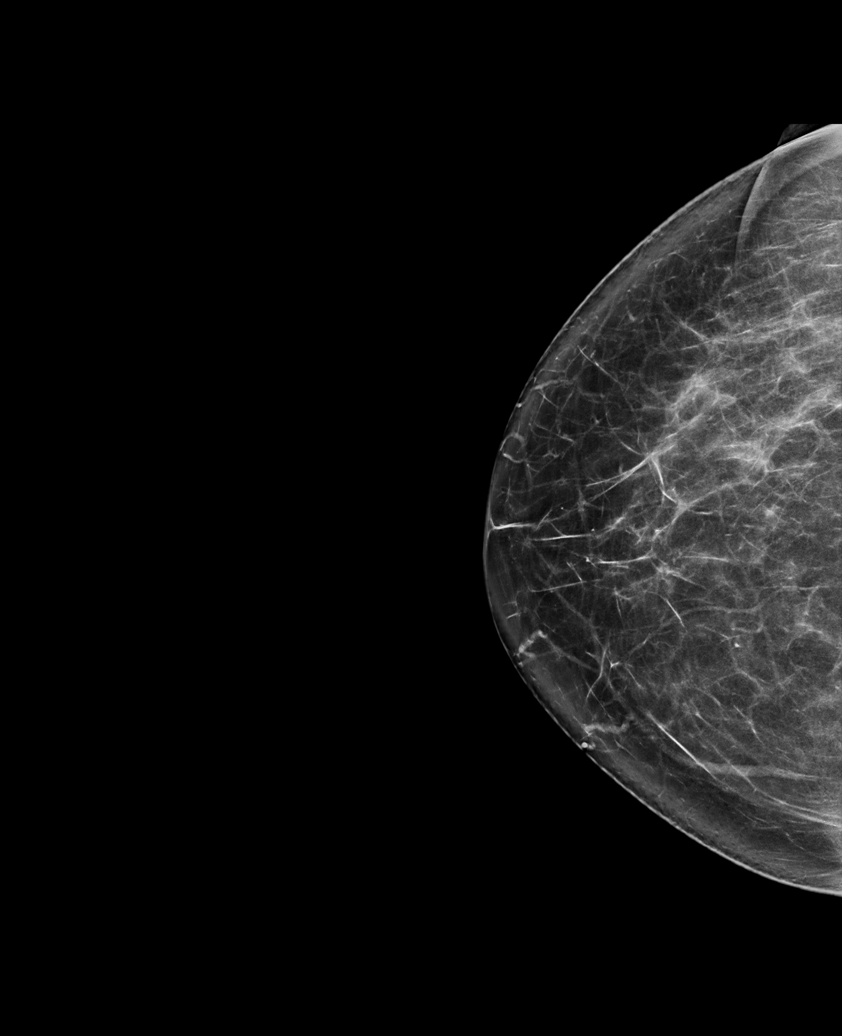

[R CC synth-2D (2 of 2)]
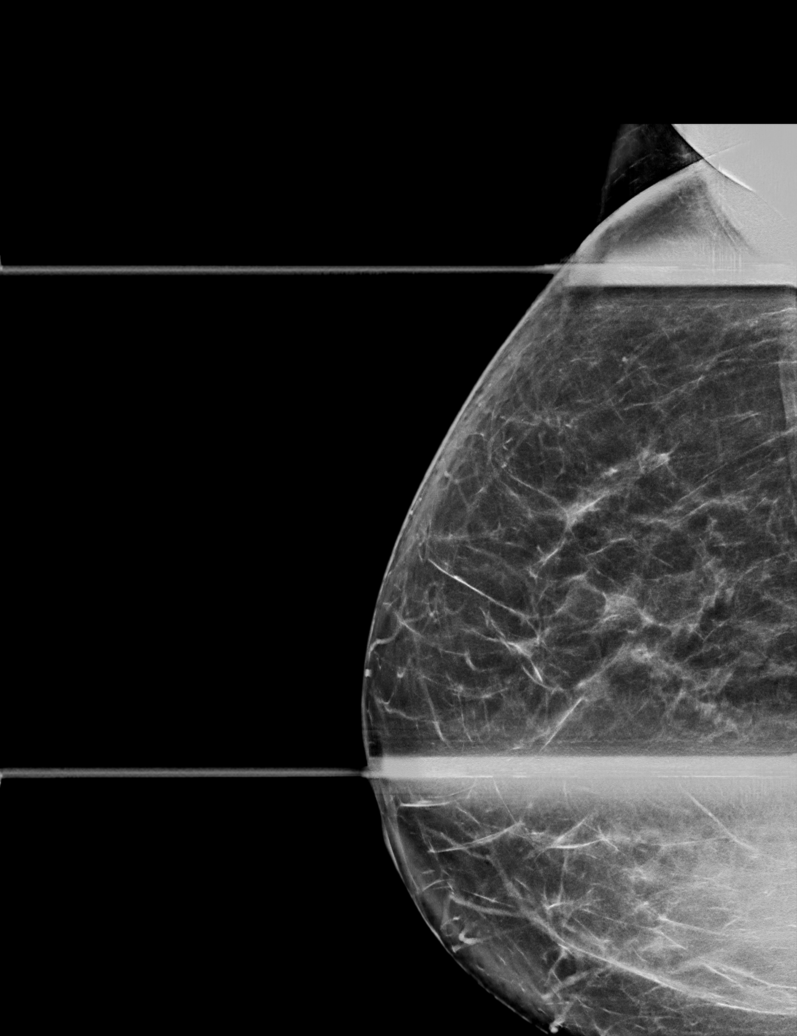

[L CC synth-2D]
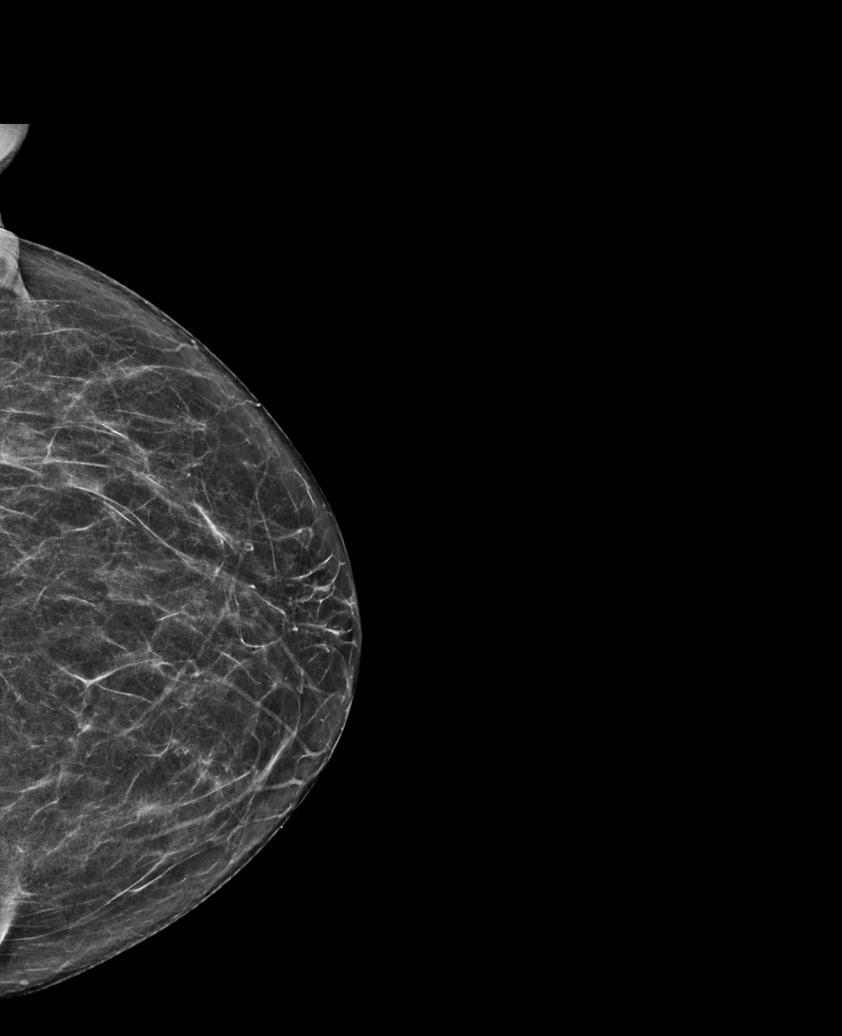

[L MLO synth-2D]
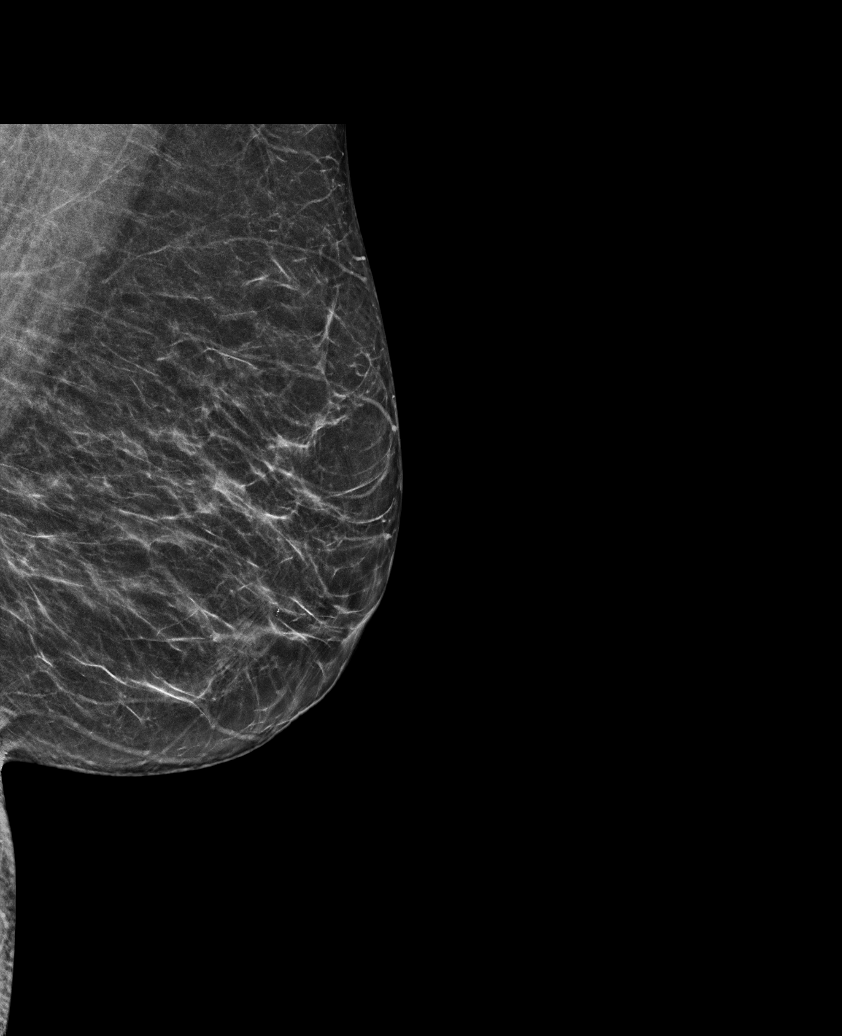

[R MLO synth-2D]
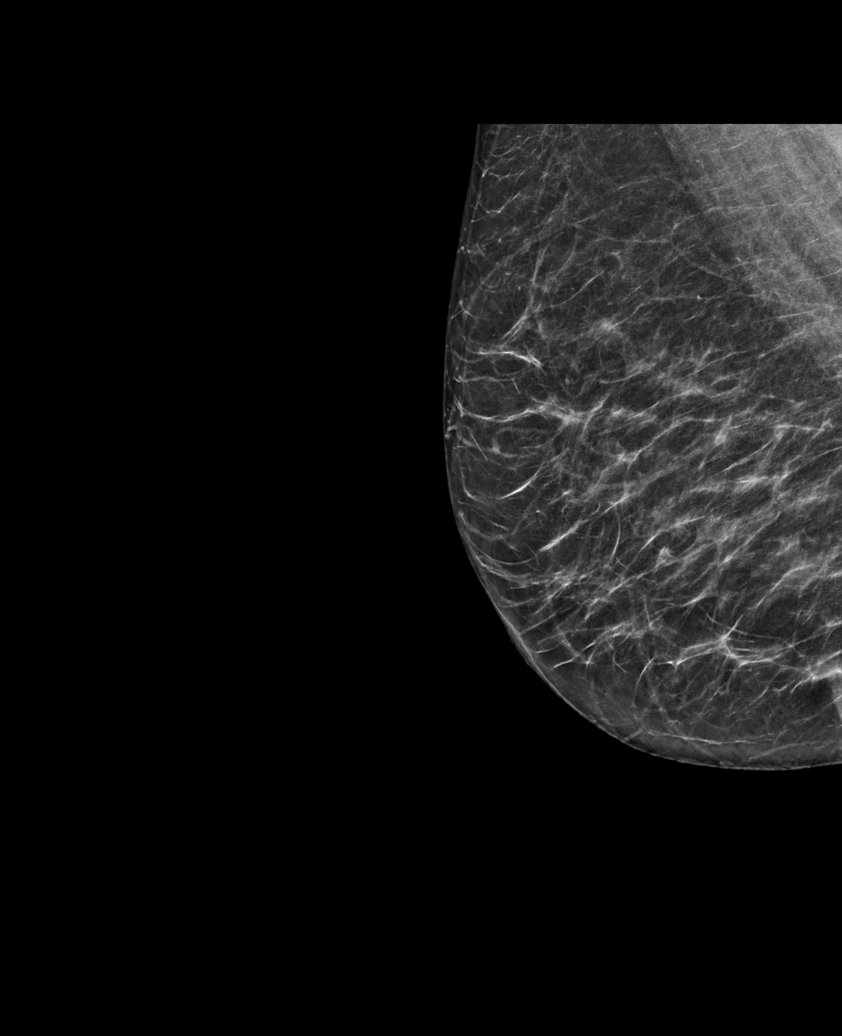

[L CC tomo · tomo slice 31/60.0]
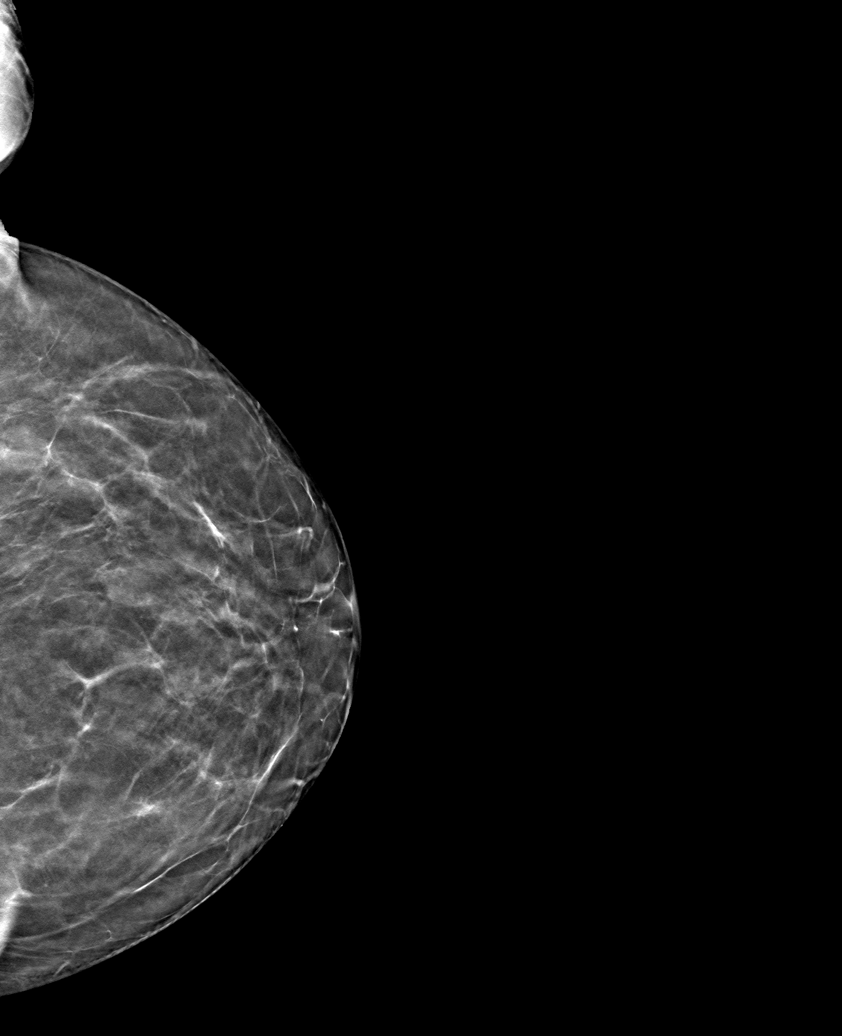

[6 of 30 positions shown; findings below may reference images not displayed]

ACR Breast Density Category b: There are scattered areas of
fibroglandular density.
FINDINGS: No suspicious masses, calcifications, or distortion are identified
in either breast.
IMPRESSION: No mammographic evidence of malignancy.

RECOMMENDATION:
Annual screening mammography.

I have discussed the findings and recommendations with the patient.
If applicable, a reminder letter will be sent to the patient
regarding the next appointment.

BI-RADS CATEGORY  1: Negative.

## 2022-09-03 DIAGNOSIS — M1712 Unilateral primary osteoarthritis, left knee: Secondary | ICD-10-CM | POA: Diagnosis not present

## 2022-09-03 DIAGNOSIS — M170 Bilateral primary osteoarthritis of knee: Secondary | ICD-10-CM | POA: Diagnosis not present

## 2022-09-03 DIAGNOSIS — M1711 Unilateral primary osteoarthritis, right knee: Secondary | ICD-10-CM | POA: Diagnosis not present

## 2022-09-05 ENCOUNTER — Encounter: Payer: Self-pay | Admitting: Family Medicine

## 2022-09-10 ENCOUNTER — Encounter: Payer: Self-pay | Admitting: Family Medicine

## 2022-09-10 ENCOUNTER — Ambulatory Visit (INDEPENDENT_AMBULATORY_CARE_PROVIDER_SITE_OTHER): Payer: Medicare HMO | Admitting: Family Medicine

## 2022-09-10 VITALS — BP 120/82 | HR 87 | Temp 98.4°F | Ht 64.0 in | Wt 239.0 lb

## 2022-09-10 DIAGNOSIS — Z6841 Body Mass Index (BMI) 40.0 and over, adult: Secondary | ICD-10-CM | POA: Diagnosis not present

## 2022-09-10 DIAGNOSIS — F1590 Other stimulant use, unspecified, uncomplicated: Secondary | ICD-10-CM | POA: Diagnosis not present

## 2022-09-10 DIAGNOSIS — E039 Hypothyroidism, unspecified: Secondary | ICD-10-CM

## 2022-09-10 DIAGNOSIS — R7309 Other abnormal glucose: Secondary | ICD-10-CM

## 2022-09-10 DIAGNOSIS — E785 Hyperlipidemia, unspecified: Secondary | ICD-10-CM | POA: Diagnosis not present

## 2022-09-10 DIAGNOSIS — Z0000 Encounter for general adult medical examination without abnormal findings: Secondary | ICD-10-CM | POA: Diagnosis not present

## 2022-09-10 HISTORY — DX: Encounter for general adult medical examination without abnormal findings: Z00.00

## 2022-09-10 MED ORDER — PHENTERMINE HCL 15 MG PO CAPS: 15.0000 mg | ORAL_CAPSULE | ORAL | 0 refills | Status: DC

## 2022-09-10 NOTE — Progress Notes (Signed)
I,Jameka J Llittleton, CMA,acting as a Neurosurgeon for [employer], NP.,have documented all relevant documentation on the behalf of  , NP,as directed by   Moshe Salisbury, NP while in the presence of  , NP.  Subjective:    Patient ID: Natalie Guerrero , female    DOB: [birth year] , 57 y.o.   MRN: [id number]  Chief Complaint  Patient presents with   Annual Exam    HPI  Patient is in the office for her physical. She is followed by GYN, Dr. Richardson Dopp for her pelvic exams.Patient has a medical history of hypothyroidism and Lymphedema especially on her right leg,She states she was referred to the vein specialist in [place] some time ago, who in turn referred her to one in [place] that can treat "her case". They agreed to do Liposuction for her but she has to lose about 20 lbs and so patient would like to discuss starting a weight loss med.  Patient would like to try phentermine again which she used years ago successfully.     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   GERD (gastroesophageal reflux disease) 2019   Glaucoma 2023   Heart murmur 09-2019   Hyperlipidemia LDL goal <100 09/15/2022   Hypothyroidism 12/08/2012   Iron deficiency anemia, unspecified 11/10/2012   Iron malabsorption 01/20/2017   Neuromuscular disorder (HCC) 2012   Since back surgery   Obesity    Thyroid disease hypothroidism     Family History  Problem Relation Age of Onset   Heart failure Mother    Anxiety disorder Mother    Arthritis Mother    Heart disease Mother    Hypertension Mother    Stroke Mother    Varicose Veins Mother    Prostate cancer Father    Alcohol abuse Father    Arthritis Father    Cancer Father    COPD Father    Depression Father    Hypertension  Father    Obesity Father    Alcohol abuse Brother    Anxiety disorder Brother    Arthritis Brother    Drug abuse Brother    Hypertension Brother    Alcohol abuse Brother    Anxiety disorder Sister    Obesity Sister    Obesity Brother    Colon cancer Neg Hx    Diabetes Neg Hx    Kidney disease Neg Hx    Liver disease Neg Hx    Neuropathy Neg Hx      Current Outpatient Medications:    b complex vitamins capsule, Take 1 capsule by mouth daily., Disp: , Rfl:    benzonatate (TESSALON PERLES) 100 MG capsule, Take 1 capsule (100 mg total) by mouth 3 (three) times daily as needed for cough., Disp: 30 capsule, Rfl: 1   brimonidine (ALPHAGAN) 0.2 % ophthalmic solution, Place 1 drop into the right eye 2 (two) times daily., Disp: , Rfl:    buPROPion (WELLBUTRIN XL) 150 MG 24 hr tablet, TAKE 1 TABLET BY MOUTH EVERY DAY, Disp: 90 tablet, Rfl: 1   cetirizine (ZYRTEC) 10 MG tablet, Take 10 mg by mouth daily., Disp: , Rfl:    Cholecalciferol (VITAMIN D3) 50 MCG (2000 UT) capsule, Take 2,000 Units by mouth daily., Disp: , Rfl:  Collagen Hydrolysate POWD, by Does not apply route., Disp: , Rfl:    docusate sodium (COLACE) 100 MG capsule, Take 200 mg by mouth 2 (two) times daily. , Disp: , Rfl:    FIBER PO, Take 4 capsules by mouth daily. prn, Disp: , Rfl:    folic acid (FOLVITE) 400 MCG tablet, Take 400 mcg by mouth daily., Disp: , Rfl:    gabapentin (NEURONTIN) 300 MG capsule, Take 300 mg by mouth 3 (three) times daily., Disp: , Rfl:    guaiFENesin (MUCINEX) 600 MG 12 hr tablet, Take by mouth 2 (two) times daily., Disp: , Rfl:    ibuprofen (ADVIL) 800 MG tablet, Take 800 mg by mouth every 8 (eight) hours as needed., Disp: , Rfl:    imipramine (TOFRANIL) 50 MG tablet, 50 mg 2 (two) times daily., Disp: , Rfl:    levothyroxine (SYNTHROID) 100 MCG tablet, TAKE ONE TABLET M-F AND TAKE 1/2 PILL ON SATURDAY/SUNDAY., Disp: 90 tablet, Rfl: 0   Magnesium 200 MG TABS, Take 4 tablets by mouth daily., Disp: ,  Rfl:    montelukast (SINGULAIR) 10 MG tablet, TAKE 1 TABLET BY MOUTH EVERY DAY, Disp: 90 tablet, Rfl: 1   naloxone (NARCAN) nasal spray 4 mg/0.1 mL, See admin instructions., Disp: , Rfl:    NOREL AD 4-10-325 MG TABS, TAKE 1 TABLET BY MOUTH TWICE A DAY AS NEEDED, Disp: 20 tablet, Rfl: 0   omeprazole (PRILOSEC) 40 MG capsule, TAKE 1 CAPSULE BY MOUTH EVERY DAY, Disp: 90 capsule, Rfl: 1   OVER THE COUNTER MEDICATION, Tea made with ginger root and tumeric root., Disp: , Rfl:    Oxycodone HCl 20 MG TABS, SMARTSIG:0.5-1 Tablet(s) By Mouth 4-6 Times Daily, Disp: , Rfl:    phentermine 15 MG capsule, Take 1 capsule (15 mg total) by mouth every morning., Disp: 30 capsule, Rfl: 0   RESTASIS 0.05 % ophthalmic emulsion, Place 1 drop into both eyes 2 (two) times daily., Disp: , Rfl:    tiZANidine (ZANAFLEX) 4 MG tablet, every 4 (four) hours as needed., Disp: , Rfl:    topiramate (TOPAMAX) 100 MG tablet, Take 100 mg by mouth 4 (four) times daily. , Disp: , Rfl: 5   UNABLE TO FIND, Med Name: Beet Chews take 2 chews daily, Disp: , Rfl:    valACYclovir (VALTREX) 500 MG tablet, as needed., Disp: , Rfl:    Zinc Citrate (ZINC GUMMY PO), Take by mouth daily., Disp: , Rfl:   Current Facility-Administered Medications:    triamcinolone acetonide (KENALOG) 10 MG/ML injection 20 mg, 20 mg, Other, Once, Regal, Kirstie Peri, DPM   Allergies  Allergen Reactions   Codeine Itching       Social History   Tobacco Use  Smoking Status Never  Smokeless Tobacco Never  Tobacco Comments   NA  . She has been exposed to passive smoke. The patient's alcohol use is:  Social History   Substance and Sexual Activity  Alcohol Use Not Currently     Review of Systems  Constitutional: Negative.   HENT: Negative.    Eyes: Negative.   Respiratory: Negative.    Cardiovascular:  Positive for leg swelling. Negative for chest pain and palpitations.  Gastrointestinal:  Positive for constipation.  Endocrine: Negative.    Genitourinary: Negative.   Musculoskeletal:  Positive for back pain.  Skin: Negative.   Allergic/Immunologic: Negative.   Neurological: Negative.   Hematological: Negative.   Psychiatric/Behavioral: Negative.       Today's Vitals   09/10/22 1137  BP: 120/82  Pulse: 87  Temp: 98.4 F (36.9 C)  Weight: 239 lb (108.4 kg)  Height: 5\' 4"  (1.626 m)  PainSc: 0-No pain   Body mass index is 41.02 kg/m.  Wt Readings from Last 3 Encounters:  09/10/22 239 lb (108.4 kg)  08/27/22 243 lb (110.2 kg)  08/07/22 244 lb 1.9 oz (110.7 kg)     Objective:  Physical Exam HENT:     Head: Normocephalic.  Cardiovascular:     Rate and Rhythm: Normal rate and regular rhythm.  Pulmonary:     Effort: Pulmonary effort is normal.     Breath sounds: Normal breath sounds.  Abdominal:     Palpations: Abdomen is soft.  Musculoskeletal:     Right lower leg: Edema present.     Comments: Patient has lymphedema on her right leg  Skin:    General: Skin is warm and dry.  Neurological:     Mental Status: She is alert and oriented to person, place, and time.  Psychiatric:        Mood and Affect: Mood normal.        Behavior: Behavior normal.         Assessment And Plan:     Encounter for general adult medical examination w/o abnormal findings -     CBC -     CMP14+EGFR  Primary hypothyroidism Assessment & Plan: Chronic labs checked  Orders: -     TSH + free T4 -     T3, free  Class 3 severe obesity due to excess calories with serious comorbidity and body mass index (BMI) of 40.0 to 44.9 in adult [facility]) Assessment & Plan: She is encouraged to strive for BMI less than 30 to decrease cardiac risk. Advised to aim for at least 150 minutes of exercise per week.   Orders: -     Phentermine HCl; Take 1 capsule (15 mg total) by mouth every morning.  Dispense: 30 capsule; Refill: 0  Abnormal glucose Assessment & Plan: Labs checked  Orders: -     Hemoglobin A1c  Other stimulant use,  unspecified, uncomplicated Assessment & Plan: EKG=NSR on 09/10/2022  Orders: -     EKG 12-Lead  Hyperlipidemia LDL goal <100 -     Lipid panel     Return for 1 year physical. Patient was given opportunity to ask questions. Patient verbalized understanding of the plan and was able to repeat key elements of the plan. All questions were answered to their satisfaction.    Moshe Salisbury, NP  I,  Moshe Salisbury, NP, have reviewed all documentation for this visit. The documentation on 09/15/22 for the exam, diagnosis, procedures, and orders are all accurate and complete.

## 2022-09-15 ENCOUNTER — Encounter: Payer: Self-pay | Admitting: Family Medicine

## 2022-09-15 DIAGNOSIS — E785 Hyperlipidemia, unspecified: Secondary | ICD-10-CM | POA: Insufficient documentation

## 2022-09-15 DIAGNOSIS — R7309 Other abnormal glucose: Secondary | ICD-10-CM | POA: Insufficient documentation

## 2022-09-15 DIAGNOSIS — F1590 Other stimulant use, unspecified, uncomplicated: Secondary | ICD-10-CM | POA: Insufficient documentation

## 2022-09-15 HISTORY — DX: Hyperlipidemia, unspecified: E78.5

## 2022-09-15 NOTE — Assessment & Plan Note (Signed)
Labs checked

## 2022-09-15 NOTE — Assessment & Plan Note (Signed)
She is encouraged to strive for BMI less than 30 to decrease cardiac risk. Advised to aim for at least 150 minutes of exercise per week.  

## 2022-09-15 NOTE — Assessment & Plan Note (Addendum)
EKG=NSR on 09/10/2022

## 2022-09-15 NOTE — Assessment & Plan Note (Signed)
Chronic labs checked

## 2022-09-21 DIAGNOSIS — I890 Lymphedema, not elsewhere classified: Secondary | ICD-10-CM | POA: Diagnosis not present

## 2022-09-25 DIAGNOSIS — H401131 Primary open-angle glaucoma, bilateral, mild stage: Secondary | ICD-10-CM | POA: Diagnosis not present

## 2022-09-25 DIAGNOSIS — M533 Sacrococcygeal disorders, not elsewhere classified: Secondary | ICD-10-CM | POA: Diagnosis not present

## 2022-09-25 DIAGNOSIS — H16223 Keratoconjunctivitis sicca, not specified as Sjogren's, bilateral: Secondary | ICD-10-CM | POA: Diagnosis not present

## 2022-09-25 DIAGNOSIS — H40012 Open angle with borderline findings, low risk, left eye: Secondary | ICD-10-CM | POA: Diagnosis not present

## 2022-09-30 DIAGNOSIS — R202 Paresthesia of skin: Secondary | ICD-10-CM | POA: Diagnosis not present

## 2022-09-30 DIAGNOSIS — M179 Osteoarthritis of knee, unspecified: Secondary | ICD-10-CM | POA: Diagnosis not present

## 2022-09-30 DIAGNOSIS — M5136 Other intervertebral disc degeneration, lumbar region: Secondary | ICD-10-CM | POA: Diagnosis not present

## 2022-09-30 DIAGNOSIS — M62830 Muscle spasm of back: Secondary | ICD-10-CM | POA: Diagnosis not present

## 2022-09-30 DIAGNOSIS — M5430 Sciatica, unspecified side: Secondary | ICD-10-CM | POA: Diagnosis not present

## 2022-09-30 DIAGNOSIS — G8929 Other chronic pain: Secondary | ICD-10-CM | POA: Diagnosis not present

## 2022-10-05 ENCOUNTER — Other Ambulatory Visit: Payer: Self-pay | Admitting: Internal Medicine

## 2022-10-05 DIAGNOSIS — J309 Allergic rhinitis, unspecified: Secondary | ICD-10-CM

## 2022-10-06 ENCOUNTER — Encounter: Payer: Medicare HMO | Admitting: Internal Medicine

## 2022-10-08 ENCOUNTER — Other Ambulatory Visit: Payer: Self-pay | Admitting: Family Medicine

## 2022-10-22 DIAGNOSIS — I890 Lymphedema, not elsewhere classified: Secondary | ICD-10-CM | POA: Diagnosis not present

## 2022-10-22 MED ORDER — PHENTERMINE HCL 15 MG PO CAPS
15.0000 mg | ORAL_CAPSULE | ORAL | 0 refills | Status: DC
Start: 2022-10-22 — End: 2022-11-26

## 2022-10-28 DIAGNOSIS — F1910 Other psychoactive substance abuse, uncomplicated: Secondary | ICD-10-CM | POA: Diagnosis not present

## 2022-10-28 DIAGNOSIS — G894 Chronic pain syndrome: Secondary | ICD-10-CM | POA: Diagnosis not present

## 2022-10-28 DIAGNOSIS — Z79891 Long term (current) use of opiate analgesic: Secondary | ICD-10-CM | POA: Diagnosis not present

## 2022-11-21 DIAGNOSIS — I890 Lymphedema, not elsewhere classified: Secondary | ICD-10-CM | POA: Diagnosis not present

## 2022-11-24 ENCOUNTER — Ambulatory Visit (INDEPENDENT_AMBULATORY_CARE_PROVIDER_SITE_OTHER): Payer: Medicare HMO | Admitting: Internal Medicine

## 2022-11-24 ENCOUNTER — Other Ambulatory Visit: Payer: Self-pay | Admitting: Internal Medicine

## 2022-11-24 VITALS — BP 110/78 | HR 88 | Temp 98.1°F | Ht 64.0 in | Wt 241.2 lb

## 2022-11-24 DIAGNOSIS — M79622 Pain in left upper arm: Secondary | ICD-10-CM

## 2022-11-24 DIAGNOSIS — E039 Hypothyroidism, unspecified: Secondary | ICD-10-CM | POA: Diagnosis not present

## 2022-11-24 DIAGNOSIS — R519 Headache, unspecified: Secondary | ICD-10-CM

## 2022-11-24 DIAGNOSIS — F419 Anxiety disorder, unspecified: Secondary | ICD-10-CM

## 2022-11-24 DIAGNOSIS — E66813 Obesity, class 3: Secondary | ICD-10-CM | POA: Diagnosis not present

## 2022-11-24 DIAGNOSIS — Z6841 Body Mass Index (BMI) 40.0 and over, adult: Secondary | ICD-10-CM

## 2022-11-24 NOTE — Progress Notes (Unsigned)
I,Natalie Guerrero, CMA,acting as a Neurosurgeon for [name], MD.,have documented all relevant documentation on the behalf of [name], MD,as directed by  [name], MD while in the presence of [name], MD.  Subjective:  Patient ID: Natalie Guerrero , female    DOB: Nov 22, 1965 , 57 y.o.   MRN: [id number]  Chief Complaint  Patient presents with   Hypothyroidism   Hyperlipidemia    HPI  The patient is here today for a thyroid & cholesterol follow-up.  She reports compliance with levothyroxine supplementation. She has not had any issues with the medication.  Denies headache, chest pain & sob.  She also adds that phentermine prescribed by another provider does not appear to be effective.  She would like a different medication.     Thyroid Problem Presents for follow-up visit. Patient reports no diarrhea, dry skin, hoarse voice, menstrual problem or palpitations.     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   GERD (gastroesophageal reflux disease) 2019   Glaucoma 2023   Heart murmur 09-2019   Hyperlipidemia LDL goal <100 09/15/2022   Hypothyroidism 12/08/2012   Iron deficiency anemia, unspecified 11/10/2012   Iron malabsorption 01/20/2017   Neuromuscular disorder (HCC) 2012   Since back surgery   Obesity    Thyroid disease hypothroidism     Family History  Problem Relation Age of Onset   Heart failure Mother    Anxiety disorder Mother    Arthritis Mother    Heart disease Mother    Hypertension Mother    Stroke Mother    Varicose Veins Mother    Prostate cancer Father    Alcohol abuse Father    Arthritis Father    Cancer Father    COPD Father    Depression Father    Hypertension Father    Obesity Father    Alcohol abuse  Brother    Anxiety disorder Brother    Arthritis Brother    Drug abuse Brother    Hypertension Brother    Alcohol abuse Brother    Anxiety disorder Sister    Obesity Sister    Obesity Brother    Colon cancer Neg Hx    Diabetes Neg Hx    Kidney disease Neg Hx    Liver disease Neg Hx    Neuropathy Neg Hx      Current Outpatient Medications:    b complex vitamins capsule, Take 1 capsule by mouth daily., Disp: , Rfl:    benzonatate (TESSALON PERLES) 100 MG capsule, Take 1 capsule (100 mg total) by mouth 3 (three) times daily as needed for cough., Disp: 30 capsule, Rfl: 1   brimonidine (ALPHAGAN) 0.2 % ophthalmic solution, Place 1 drop into the right eye 2 (two) times daily., Disp: , Rfl:    buPROPion (WELLBUTRIN XL) 150 MG 24 hr tablet, TAKE 1 TABLET BY MOUTH EVERY DAY, Disp: 90 tablet, Rfl: 1   cetirizine (ZYRTEC) 10 MG tablet, Take 10 mg by mouth daily., Disp: , Rfl:    Cholecalciferol (VITAMIN D3) 50 MCG (2000 UT) capsule, Take 2,000 Units by mouth daily., Disp: , Rfl:    Collagen Hydrolysate POWD, by Does not apply route., Disp: , Rfl:    docusate sodium (COLACE)  100 MG capsule, Take 200 mg by mouth 2 (two) times daily. , Disp: , Rfl:    FIBER PO, Take 4 capsules by mouth daily. prn, Disp: , Rfl:    folic acid (FOLVITE) 400 MCG tablet, Take 400 mcg by mouth daily., Disp: , Rfl:    gabapentin (NEURONTIN) 300 MG capsule, Take 300 mg by mouth 3 (three) times daily., Disp: , Rfl:    guaiFENesin (MUCINEX) 600 MG 12 hr tablet, Take by mouth 2 (two) times daily., Disp: , Rfl:    ibuprofen (ADVIL) 800 MG tablet, Take 800 mg by mouth every 8 (eight) hours as needed., Disp: , Rfl:    imipramine (TOFRANIL) 50 MG tablet, 50 mg 2 (two) times daily., Disp: , Rfl:    Magnesium 200 MG TABS, Take 4 tablets by mouth daily., Disp: , Rfl:    montelukast (SINGULAIR) 10 MG tablet, TAKE 1 TABLET BY MOUTH EVERY DAY, Disp: 90 tablet, Rfl: 1   naloxone (NARCAN) nasal spray 4 mg/0.1 mL, See admin  instructions., Disp: , Rfl:    NOREL AD 4-10-325 MG TABS, TAKE 1 TABLET BY MOUTH TWICE A DAY AS NEEDED, Disp: 20 tablet, Rfl: 0   omeprazole (PRILOSEC) 40 MG capsule, TAKE 1 CAPSULE BY MOUTH EVERY DAY, Disp: 90 capsule, Rfl: 1   OVER THE COUNTER MEDICATION, Tea made with ginger root and tumeric root., Disp: , Rfl:    Oxycodone HCl 20 MG TABS, SMARTSIG:0.5-1 Tablet(s) By Mouth 4-6 Times Daily, Disp: , Rfl:    phentermine 15 MG capsule, Take 1 capsule (15 mg total) by mouth every morning., Disp: 30 capsule, Rfl: 0   RESTASIS 0.05 % ophthalmic emulsion, Place 1 drop into both eyes 2 (two) times daily., Disp: , Rfl:    tiZANidine (ZANAFLEX) 4 MG tablet, every 4 (four) hours as needed., Disp: , Rfl:    UNABLE TO FIND, Med Name: Beet Chews take 2 chews daily, Disp: , Rfl:    valACYclovir (VALTREX) 500 MG tablet, as needed., Disp: , Rfl:    Zinc Citrate (ZINC GUMMY PO), Take by mouth daily., Disp: , Rfl:    levothyroxine (SYNTHROID) 100 MCG tablet, TAKE ONE TABLET M-F AND TAKE 1/2 PILL ON SATURDAY/SUNDAY., Disp: 90 tablet, Rfl: 0  Current Facility-Administered Medications:    triamcinolone acetonide (KENALOG) 10 MG/ML injection 20 mg, 20 mg, Other, Once, Regal, Kirstie Peri, DPM   Allergies  Allergen Reactions   Codeine Itching     Review of Systems  Constitutional: Negative.   HENT:  Negative for hoarse voice.   Respiratory: Negative.    Cardiovascular: Negative.  Negative for palpitations.  Gastrointestinal: Negative.  Negative for diarrhea.  Genitourinary:  Negative for menstrual problem.  Musculoskeletal:        She reports experiencing pain under her left arm. She states the pain happens more when she lies down. Described as a sharp pain. Has to hold her axilla to provide relief. She is unable to state what triggers her symptoms.   Neurological: Negative.   Psychiatric/Behavioral: Negative.       Today's Vitals   11/24/22 1102  BP: 110/78  Pulse: 88  Temp: 98.1 F (36.7 C)  SpO2:  98%  Weight: 241 lb 3.2 oz (109.4 kg)  Height: 5\' 4"  (1.626 m)  PainSc: 0-No pain   Body mass index is 41.4 kg/m.  Wt Readings from Last 3 Encounters:  11/24/22 241 lb 3.2 oz (109.4 kg)  09/10/22 239 lb (108.4 kg)  08/27/22 243 lb (110.2 kg)  The 10-year ASCVD risk score (Arnett DK, et al., 2019) is: 2.5%   Values used to calculate the score:     Age: 33 years     Sex: Female     Is Non-Hispanic African American: Yes     Diabetic: No     Tobacco smoker: No     Systolic Blood Pressure: 124 mmHg     Is BP treated: No     HDL Cholesterol: 66 mg/dL     Total Cholesterol: 158 mg/dL   Objective:  Physical Exam Vitals and nursing note reviewed.  Constitutional:      Appearance: Normal appearance. She is obese.  HENT:     Head: Normocephalic and atraumatic.  Eyes:     Extraocular Movements: Extraocular movements intact.  Cardiovascular:     Rate and Rhythm: Normal rate and regular rhythm.     Heart sounds: Normal heart sounds.  Pulmonary:     Effort: Pulmonary effort is normal.     Breath sounds: Normal breath sounds.  Musculoskeletal:     Cervical back: Normal range of motion.     Comments: No axillary adenopathy noted  Skin:    General: Skin is warm.  Neurological:     General: No focal deficit present.     Mental Status: She is alert.  Psychiatric:        Mood and Affect: Mood normal.        Behavior: Behavior normal.         Assessment And Plan:  Primary hypothyroidism Assessment & Plan: Chronic, currently on Synthroid daily M-F and 1/2 tab on Saturday/Sundays. I will check thyroid panel and adjust meds as needed.   Orders: -     TSH + free T4  Left axillary pain Assessment & Plan: Possibly due to pinched nerve. May need NCS if symptoms persist.    Anxiety Assessment & Plan: Chronic, sx are stable with use of Wellbutrin XL 150mg  daily. She will continue with current meds.    Class 3 severe obesity due to excess calories with serious  comorbidity and body mass index (BMI) of 40.0 to 44.9 in adult [facility]) Assessment & Plan: BMI 41. She agrees to referral to the PREP program. Encouraged to aim for at least 150 minutes of exercise per week.   Orders: -     Amb Referral To Provider Referral Exercise Program (P.R.E.P)  She is encouraged to strive for BMI less than 30 to decrease cardiac risk. Advised to aim for at least 150 minutes of exercise per week.    Return in 8 weeks (on 01/19/2023), or WELLBUTRIN F/U.  Patient was given opportunity to ask questions. Patient verbalized understanding of the plan and was able to repeat key elements of the plan. All questions were answered to their satisfaction.    I, [name], MD, have reviewed all documentation for this visit. The documentation on 11/24/22 for the exam, diagnosis, procedures, and orders are all accurate and complete.   IF YOU HAVE BEEN REFERRED TO A SPECIALIST, IT MAY TAKE 1-2 WEEKS TO SCHEDULE/PROCESS THE REFERRAL. IF YOU HAVE NOT HEARD FROM US/SPECIALIST IN TWO WEEKS, PLEASE GIVE [place] A CALL AT [phone].   THE PATIENT IS ENCOURAGED TO PRACTICE SOCIAL DISTANCING DUE TO THE COVID-19 PANDEMIC.

## 2022-11-24 NOTE — Patient Instructions (Signed)

## 2022-11-25 LAB — TSH+FREE T4
Free T4: 1.02 ng/dL (ref 0.82–1.77)
TSH: 1.05 u[IU]/mL (ref 0.450–4.500)

## 2022-11-26 ENCOUNTER — Other Ambulatory Visit: Payer: Self-pay | Admitting: Internal Medicine

## 2022-11-26 ENCOUNTER — Telehealth: Payer: Self-pay

## 2022-11-26 ENCOUNTER — Other Ambulatory Visit: Payer: Self-pay | Admitting: Family Medicine

## 2022-11-26 DIAGNOSIS — M62830 Muscle spasm of back: Secondary | ICD-10-CM | POA: Diagnosis not present

## 2022-11-26 DIAGNOSIS — R202 Paresthesia of skin: Secondary | ICD-10-CM | POA: Diagnosis not present

## 2022-11-26 DIAGNOSIS — M5136 Other intervertebral disc degeneration, lumbar region with discogenic back pain only: Secondary | ICD-10-CM | POA: Diagnosis not present

## 2022-11-26 DIAGNOSIS — Z79891 Long term (current) use of opiate analgesic: Secondary | ICD-10-CM | POA: Diagnosis not present

## 2022-11-26 NOTE — Telephone Encounter (Signed)
Called re: PREP program, she would like to attend the next class at [facility] on 10/28, every M/W 2:30-3:45; asked if her husband could attend as well, assessment visit scheduled for 10/23 at 2pm

## 2022-11-30 NOTE — Assessment & Plan Note (Signed)
Chronic, sx are stable with use of Wellbutrin XL 150mg  daily. She will continue with current meds.

## 2022-11-30 NOTE — Assessment & Plan Note (Signed)
Possibly due to pinched nerve. May need NCS if symptoms persist.

## 2022-11-30 NOTE — Assessment & Plan Note (Signed)
BMI 41. She agrees to referral to the PREP program. Encouraged to aim for at least 150 minutes of exercise per week.

## 2022-11-30 NOTE — Assessment & Plan Note (Signed)
Chronic, currently on Synthroid daily M-F and 1/2 tab on Saturday/Sundays. I will check thyroid panel and adjust meds as needed.

## 2022-12-01 ENCOUNTER — Other Ambulatory Visit: Payer: Self-pay | Admitting: Internal Medicine

## 2022-12-01 DIAGNOSIS — M5412 Radiculopathy, cervical region: Secondary | ICD-10-CM

## 2022-12-01 MED ORDER — BUPROPION HCL ER (XL) 300 MG PO TB24: 300.0000 mg | ORAL_TABLET | ORAL | 0 refills | Status: DC

## 2022-12-08 NOTE — Progress Notes (Signed)
Goal setting and welcome to the program; introductions, review of notebook, tour of facility

## 2022-12-08 NOTE — Progress Notes (Signed)
[facility] PREP Evaluation  Patient Details  Name: Natalie Guerrero MRN: [id number] Date of Birth: 01-20-1966 Age: 57 y.o. PCP: [name], MD  Vitals:   12/08/22 1620  BP: 130/84  Pulse: 100  SpO2: 98%  Weight: 244 lb 6.4 oz (110.9 kg)     [facility] Eval - 12/08/22 1600       [facility] "PREP" Location   [facility] "PREP" Location [facility]      Referral    Referring Provider Allyne Gee    Reason for referral Inactivity;Obesitity/Overweight    Program Start Date 12/08/22      Measurement   Waist Circumference 36.5 inches    Hip Circumference 48 inches    Body fat 47.9 percent      Information for Trainer   Goals --   Lose 15-20 pounds by end of program;   Current Exercise none    Orthopedic Concerns --   OA knees, s/p Lumbar fusion   Pertinent Medical History --   gastric bypass, anemia     Timed Up and Go (TUGS)   Timed Up and Go Low risk <9 seconds      Mobility and Daily Activities   I find it easy to walk up or down two or more flights of stairs. 1    I have no trouble taking out the trash. 4    I do housework such as vacuuming and dusting on my own without difficulty. 2    I can easily lift a gallon of milk (8lbs). 4    I can easily walk a mile. 1    I have no trouble reaching into high cupboards or reaching down to pick up something from the floor. 4    I do not have trouble doing out-door work such as Loss adjuster, chartered, raking leaves, or gardening. 1      Mobility and Daily Activities   I feel younger than my age. 2    I feel independent. 2    I feel energetic. 2    I live an active life.  1    I feel strong. 2    I feel healthy. 1    I feel active as other people my age. 1      How fit and strong are you.   Fit and Strong Total Score 28            Past Medical History:  Diagnosis Date   Allergy 1985   Anxiety    Arthritis    DDD (degenerative disc disease)    Depression    Encounter for general adult medical examination w/o abnormal  findings 09/10/2022   Erythropoietin deficiency anemia 08/07/2022   GERD (gastroesophageal reflux disease) 2019   Glaucoma 2023   Heart murmur 09-2019   Hyperlipidemia LDL goal <100 09/15/2022   Hypothyroidism 12/08/2012   Iron deficiency anemia, unspecified 11/10/2012   Iron malabsorption 01/20/2017   Neuromuscular disorder (HCC) 2012   Since back surgery   Obesity    Thyroid disease hypothroidism   Past Surgical History:  Procedure Laterality Date   ABDOMINAL HYSTERECTOMY  02/10/1997   BACK SURGERY     CESAREAN SECTION  02/10/1989   CHOLECYSTECTOMY N/A 12/22/2017   Procedure: LAPAROSCOPIC CHOLECYSTECTOMY;  Surgeon: Axel Filler, MD;  Location: [facility] OR;  Service: General;  Laterality: N/A;   COLON SURGERY  2000   Gastric bypass   GASTRIC BYPASS  02/10/2005   KNEE SURGERY Left 02/10/1998   SMALL INTESTINE SURGERY  2000   Gastric bypass   SPINE SURGERY  2012   Social History   Tobacco Use  Smoking Status Never  Smokeless Tobacco Never  Tobacco Comments   NA  To begin PREP class at [facility] today, every M/W 2:30-3:45  Sonia Baller 12/08/2022, 4:23 PM

## 2022-12-10 MED ORDER — PHENTERMINE HCL 30 MG PO CAPS
30.0000 mg | ORAL_CAPSULE | ORAL | 0 refills | Status: DC
Start: 2022-12-10 — End: 2023-06-01

## 2022-12-20 ENCOUNTER — Other Ambulatory Visit: Payer: Self-pay | Admitting: Internal Medicine

## 2022-12-22 ENCOUNTER — Encounter: Payer: Self-pay | Admitting: Internal Medicine

## 2022-12-22 NOTE — Progress Notes (Signed)
[facility] PREP Weekly Session  Patient Details  Name: Natalie Guerrero MRN: [id number] Date of Birth: 1965-05-04 Age: 57 y.o. PCP: [name], MD  There were no vitals filed for this visit.   [facility] Weekly seesion - 12/22/22 1500       [facility] "PREP" Location   [facility] "PREP" Location [facility]      Weekly Session   Topic Discussed Healthy eating tips   Foods to reduce, foods to increase; introduced to YUKA app; eat the rainbow of colors.   Classes attended to date 3             Natalie Guerrero 12/22/2022, 3:58 PM

## 2022-12-23 DIAGNOSIS — M51362 Other intervertebral disc degeneration, lumbar region with discogenic back pain and lower extremity pain: Secondary | ICD-10-CM | POA: Diagnosis not present

## 2022-12-23 DIAGNOSIS — M179 Osteoarthritis of knee, unspecified: Secondary | ICD-10-CM | POA: Diagnosis not present

## 2022-12-23 DIAGNOSIS — M62830 Muscle spasm of back: Secondary | ICD-10-CM | POA: Diagnosis not present

## 2022-12-23 DIAGNOSIS — Z79891 Long term (current) use of opiate analgesic: Secondary | ICD-10-CM | POA: Diagnosis not present

## 2023-01-01 ENCOUNTER — Ambulatory Visit: Payer: Medicare HMO | Admitting: Internal Medicine

## 2023-01-01 ENCOUNTER — Encounter: Payer: Self-pay | Admitting: Internal Medicine

## 2023-01-01 VITALS — BP 82/70 | HR 99 | Temp 98.4°F | Ht 64.0 in | Wt 239.8 lb

## 2023-01-01 DIAGNOSIS — M5416 Radiculopathy, lumbar region: Secondary | ICD-10-CM | POA: Diagnosis not present

## 2023-01-01 DIAGNOSIS — F419 Anxiety disorder, unspecified: Secondary | ICD-10-CM | POA: Diagnosis not present

## 2023-01-01 DIAGNOSIS — I491 Atrial premature depolarization: Secondary | ICD-10-CM | POA: Diagnosis not present

## 2023-01-01 DIAGNOSIS — R55 Syncope and collapse: Secondary | ICD-10-CM | POA: Diagnosis not present

## 2023-01-01 DIAGNOSIS — I951 Orthostatic hypotension: Secondary | ICD-10-CM | POA: Insufficient documentation

## 2023-01-01 NOTE — Progress Notes (Addendum)
I,Victoria T Deloria Lair, CMA,acting as a Neurosurgeon for [name], MD.,have documented all relevant documentation on the behalf of [name], MD,as directed by  [name], MD while in the presence of [name], MD.  Subjective:  Patient ID: Natalie Guerrero , female    DOB: 1965-08-03 , 57 y.o.   MRN: [id number]  Chief Complaint  Patient presents with   Med Check     HPI  Patient presents today for Wellbutrin follow up. She reports compliance with medication. She admits medication has worked great for her. It has helped with her anxiety.   She adds, she had a recent fall last Tuesday. She states she was coming downstairs to make her husband some coffee when she fell. She did admit that she felt lightheaded as she was coming down the steps.  She states she walked to the trashcan and all of a sudden felt as if she was going to pass out. She then fell backwards and hit her head on the floor. She does admit losing consciousness. She states she awakened and her husband was standing over her. He wanted to call 911, but she did not allow him to.  She did not want to get checked out because "I have passed out before and I knew I hadn't broken anything".  She admits to feeling dizzy days prior to this event.   Denies chest pain & blurred vision.      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   GERD (gastroesophageal reflux disease) 2019   Glaucoma 2023   Heart murmur 09-2019   Hyperlipidemia LDL goal <100 09/15/2022   Hypothyroidism 12/08/2012   Iron deficiency anemia, unspecified 11/10/2012   Iron malabsorption 01/20/2017   Neuromuscular disorder (HCC) 2012   Since back surgery   Obesity    Thyroid disease hypothroidism     Family History  Problem  Relation Age of Onset   Heart failure Mother    Anxiety disorder Mother    Arthritis Mother    Heart disease Mother    Hypertension Mother    Stroke Mother    Varicose Veins Mother    Prostate cancer Father    Alcohol abuse Father    Arthritis Father    Cancer Father    COPD Father    Depression Father    Hypertension Father    Obesity Father    Alcohol abuse Brother    Anxiety disorder Brother    Arthritis Brother    Drug abuse Brother    Hypertension Brother    Alcohol abuse Brother    Anxiety disorder Sister    Obesity Sister    Obesity Brother    Colon cancer Neg Hx    Diabetes Neg Hx    Kidney disease Neg Hx    Liver disease Neg Hx    Neuropathy Neg Hx      Current Outpatient Medications:    b complex vitamins capsule, Take 1 capsule by mouth daily., Disp: , Rfl:    benzonatate (TESSALON PERLES) 100 MG capsule, Take 1 capsule (100 mg total) by mouth 3 (three) times daily as needed for cough., Disp: 30 capsule, Rfl: 1   brimonidine (ALPHAGAN) 0.2 % ophthalmic solution, Place 1 drop into  the right eye 2 (two) times daily., Disp: , Rfl:    buPROPion (WELLBUTRIN XL) 300 MG 24 hr tablet, Take 1 tablet (300 mg total) by mouth every morning., Disp: 90 tablet, Rfl: 0   cetirizine (ZYRTEC) 10 MG tablet, Take 10 mg by mouth daily., Disp: , Rfl:    Cholecalciferol (VITAMIN D3) 50 MCG (2000 UT) capsule, Take 2,000 Units by mouth daily., Disp: , Rfl:    Collagen Hydrolysate POWD, by Does not apply route., Disp: , Rfl:    docusate sodium (COLACE) 100 MG capsule, Take 200 mg by mouth 2 (two) times daily. , Disp: , Rfl:    FIBER PO, Take 4 capsules by mouth daily. prn, Disp: , Rfl:    folic acid (FOLVITE) 400 MCG tablet, Take 400 mcg by mouth daily., Disp: , Rfl:    guaiFENesin (MUCINEX) 600 MG 12 hr tablet, Take by mouth 2 (two) times daily., Disp: , Rfl:    ibuprofen (ADVIL) 800 MG tablet, Take 800 mg by mouth every 8 (eight) hours as needed., Disp: , Rfl:    imipramine  (TOFRANIL) 50 MG tablet, 50 mg 2 (two) times daily., Disp: , Rfl:    levothyroxine (SYNTHROID) 100 MCG tablet, TAKE ONE TABLET M-F AND TAKE 1/2 PILL ON SATURDAY/SUNDAY., Disp: 90 tablet, Rfl: 0   Magnesium 200 MG TABS, Take 4 tablets by mouth daily., Disp: , Rfl:    montelukast (SINGULAIR) 10 MG tablet, TAKE 1 TABLET BY MOUTH EVERY DAY, Disp: 90 tablet, Rfl: 1   naloxone (NARCAN) nasal spray 4 mg/0.1 mL, See admin instructions., Disp: , Rfl:    NOREL AD 4-10-325 MG TABS, TAKE 1 TABLET BY MOUTH TWICE A DAY AS NEEDED, Disp: 20 tablet, Rfl: 0   omeprazole (PRILOSEC) 40 MG capsule, TAKE 1 CAPSULE BY MOUTH EVERY DAY, Disp: 90 capsule, Rfl: 1   OVER THE COUNTER MEDICATION, Tea made with ginger root and tumeric root., Disp: , Rfl:    Oxycodone HCl 20 MG TABS, SMARTSIG:0.5-1 Tablet(s) By Mouth 4-6 Times Daily, Disp: , Rfl:    phentermine 30 MG capsule, Take 1 capsule (30 mg total) by mouth every morning., Disp: 30 capsule, Rfl: 0   RESTASIS 0.05 % ophthalmic emulsion, Place 1 drop into both eyes 2 (two) times daily., Disp: , Rfl:    tiZANidine (ZANAFLEX) 4 MG tablet, every 4 (four) hours as needed., Disp: , Rfl:    UNABLE TO FIND, Med Name: Beet Chews take 2 chews daily, Disp: , Rfl:    valACYclovir (VALTREX) 500 MG tablet, as needed., Disp: , Rfl:    Zinc Citrate (ZINC GUMMY PO), Take by mouth daily., Disp: , Rfl:    gabapentin (NEURONTIN) 300 MG capsule, Take 300 mg by mouth 3 (three) times daily. (Patient not taking: Reported on 01/01/2023), Disp: , Rfl:   Current Facility-Administered Medications:    triamcinolone acetonide (KENALOG) 10 MG/ML injection 20 mg, 20 mg, Other, Once, Regal, Kirstie Peri, DPM   Allergies  Allergen Reactions   Codeine Itching     Review of Systems  Constitutional: Negative.   Respiratory: Negative.    Cardiovascular: Negative.   Musculoskeletal:  Positive for back pain.       She has chronic back pain. Pain radiates to legs/feet. Denies urinary/fecal incontinence. She  has been followed by Pain Mgmt in the past.   Neurological:  Positive for dizziness.  Psychiatric/Behavioral: Negative.     No data found.    Today's Vitals   01/01/23 1515  BP: Marland Kitchen)  82/70  Pulse: 99  Temp: 98.4 F (36.9 C)  SpO2: 98%  Weight: 239 lb 12.8 oz (108.8 kg)  Height: 5\' 4"  (1.626 m)   Body mass index is 41.16 kg/m.  Wt Readings from Last 3 Encounters:  01/01/23 239 lb 12.8 oz (108.8 kg)  12/08/22 244 lb 6.4 oz (110.9 kg)  11/24/22 241 lb 3.2 oz (109.4 kg)     Objective:  Physical Exam Vitals and nursing note reviewed.  Constitutional:      Appearance: Normal appearance. She is obese.  HENT:     Head: Normocephalic and atraumatic.  Cardiovascular:     Rate and Rhythm: Normal rate and regular rhythm.     Heart sounds: Normal heart sounds.  Pulmonary:     Effort: Pulmonary effort is normal.     Breath sounds: Normal breath sounds.  Musculoskeletal:     Cervical back: Normal range of motion.  Skin:    General: Skin is warm.  Neurological:     General: No focal deficit present.     Mental Status: She is alert.  Psychiatric:        Mood and Affect: Mood normal.        Behavior: Behavior normal.       Assessment And Plan:  Anxiety Assessment & Plan: Chronic, symptoms improved with Wellbutrin XL 300mg  daily. Encouraged to take meds as prescribed.    Syncope, unspecified syncope type Assessment & Plan: I think her sx are possibly vasovagal exacerbated by dehydration. She is orthostatic today.  She is advised to go home and eat something salty like soup.  It sounds as if she may not have eaten enough the day prior to the event. Initial EKG was NSR w/ PACs, second EKG was NSR. I will refer her to Cardiology for further evaluation. May benefit from wearing a monitor. I will check labs as below. I will also refer her for echocardiogram. She is in agreement with treatment plan.  May benefit from incorporating coconut water into her diet. She is not on any  anti-hypertensives. However, she is on chronic opioid therapy. Pain mgmt may need to adjust her dosing.   Orders: -     EKG 12-Lead -     EKG 12-Lead -     CMP14+EGFR -     CBC -     ECHOCARDIOGRAM COMPLETE; Future -     Ambulatory referral to Cardiology  PAC (premature atrial contraction) Assessment & Plan: Again, I will check electrolytes. Has had palpitations. Will refer her to Cardiology for further evaluation. May benefit from nightly Mg supplementation, which will also address her long-standing anxiety.   Orders: -     Ambulatory referral to Cardiology -     Magnesium  Lumbar radiculopathy Assessment & Plan: Chronic, will refer her for b/l LE nerve conduction studies as requested.   Orders: -     Ambulatory referral to Neurology     Return in about 4 weeks (around 01/29/2023), or [place] - orthostatics--sitting and standing only.  Patient was given opportunity to ask questions. Patient verbalized understanding of the plan and was able to repeat key elements of the plan. All questions were answered to their satisfaction.    I, [name], MD, have reviewed all documentation for this visit. The documentation on 01/01/23 for the exam, diagnosis, procedures, and orders are all accurate and complete.   IF YOU HAVE BEEN REFERRED TO A SPECIALIST, IT MAY TAKE 1-2 WEEKS TO SCHEDULE/PROCESS THE REFERRAL. IF  YOU HAVE NOT HEARD FROM US/SPECIALIST IN TWO WEEKS, PLEASE GIVE [place] A CALL AT [phone].   THE PATIENT IS ENCOURAGED TO PRACTICE SOCIAL DISTANCING DUE TO THE COVID-19 PANDEMIC.

## 2023-01-02 LAB — CBC
Hematocrit: 34.6 % (ref 34.0–46.6)
Hemoglobin: 11.1 g/dL (ref 11.1–15.9)
MCH: 32.3 pg (ref 26.6–33.0)
MCHC: 32.1 g/dL (ref 31.5–35.7)
MCV: 101 fL — ABNORMAL HIGH (ref 79–97)
Platelets: 368 10*3/uL (ref 150–450)
RBC: 3.44 x10E6/uL — ABNORMAL LOW (ref 3.77–5.28)
RDW: 11.8 % (ref 11.7–15.4)
WBC: 6.9 10*3/uL (ref 3.4–10.8)

## 2023-01-02 LAB — CMP14+EGFR
ALT: 21 [IU]/L (ref 0–32)
AST: 28 [IU]/L (ref 0–40)
Albumin: 4 g/dL (ref 3.8–4.9)
Alkaline Phosphatase: 100 [IU]/L (ref 44–121)
BUN/Creatinine Ratio: 13 (ref 9–23)
BUN: 14 mg/dL (ref 6–24)
Bilirubin Total: 0.3 mg/dL (ref 0.0–1.2)
CO2: 21 mmol/L (ref 20–29)
Calcium: 8.9 mg/dL (ref 8.7–10.2)
Chloride: 94 mmol/L — ABNORMAL LOW (ref 96–106)
Creatinine, Ser: 1.11 mg/dL — ABNORMAL HIGH (ref 0.57–1.00)
Globulin, Total: 2.8 g/dL (ref 1.5–4.5)
Glucose: 83 mg/dL (ref 70–99)
Potassium: 4.7 mmol/L (ref 3.5–5.2)
Sodium: 127 mmol/L — ABNORMAL LOW (ref 134–144)
Total Protein: 6.8 g/dL (ref 6.0–8.5)
eGFR: 58 mL/min/{1.73_m2} — ABNORMAL LOW (ref >59)

## 2023-01-02 LAB — MAGNESIUM: Magnesium: 2.1 mg/dL (ref 1.6–2.3)

## 2023-01-03 ENCOUNTER — Encounter: Payer: Self-pay | Admitting: Internal Medicine

## 2023-01-03 ENCOUNTER — Other Ambulatory Visit: Payer: Self-pay | Admitting: Internal Medicine

## 2023-01-03 DIAGNOSIS — E871 Hypo-osmolality and hyponatremia: Secondary | ICD-10-CM

## 2023-01-05 ENCOUNTER — Other Ambulatory Visit: Payer: Self-pay

## 2023-01-05 DIAGNOSIS — E871 Hypo-osmolality and hyponatremia: Secondary | ICD-10-CM

## 2023-01-05 DIAGNOSIS — M5416 Radiculopathy, lumbar region: Secondary | ICD-10-CM | POA: Insufficient documentation

## 2023-01-05 NOTE — Assessment & Plan Note (Signed)
Chronic, symptoms improved with Wellbutrin XL 300mg  daily. Encouraged to take meds as prescribed.

## 2023-01-05 NOTE — Assessment & Plan Note (Addendum)
I think her sx are possibly vasovagal exacerbated by dehydration. She is orthostatic today.  She is advised to go home and eat something salty like soup.  It sounds as if she may not have eaten enough the day prior to the event. Initial EKG was NSR w/ PACs, second EKG was NSR. I will refer her to Cardiology for further evaluation. May benefit from wearing a monitor. I will check labs as below. I will also refer her for echocardiogram. She is in agreement with treatment plan.  May benefit from incorporating coconut water into her diet. She is not on any anti-hypertensives. However, she is on chronic opioid therapy. Pain mgmt may need to adjust her dosing.

## 2023-01-05 NOTE — Assessment & Plan Note (Signed)
Chronic, will refer her for b/l LE nerve conduction studies as requested.

## 2023-01-05 NOTE — Assessment & Plan Note (Deleted)
Again, I think this is due to dehydration. Advised to go home and eat something salty like soup. May benefit from incorporating coconut water into her diet. She is not on any anti-hypertensives. However, she is on chronic opioid therapy. Pain mgmt may need to adjust her dosing.

## 2023-01-05 NOTE — Assessment & Plan Note (Signed)
Again, I will check electrolytes. Has had palpitations. Will refer her to Cardiology for further evaluation. May benefit from nightly Mg supplementation, which will also address her long-standing anxiety.

## 2023-01-06 ENCOUNTER — Other Ambulatory Visit: Payer: Medicare HMO

## 2023-01-06 DIAGNOSIS — E871 Hypo-osmolality and hyponatremia: Secondary | ICD-10-CM

## 2023-01-07 LAB — BASIC METABOLIC PANEL
BUN/Creatinine Ratio: 13 (ref 9–23)
BUN: 12 mg/dL (ref 6–24)
CO2: 22 mmol/L (ref 20–29)
Calcium: 9.2 mg/dL (ref 8.7–10.2)
Chloride: 102 mmol/L (ref 96–106)
Creatinine, Ser: 0.92 mg/dL (ref 0.57–1.00)
Glucose: 79 mg/dL (ref 70–99)
Potassium: 4.5 mmol/L (ref 3.5–5.2)
Sodium: 137 mmol/L (ref 134–144)
eGFR: 73 mL/min/{1.73_m2} (ref >59)

## 2023-01-09 ENCOUNTER — Ambulatory Visit: Payer: Medicare HMO | Attending: Internal Medicine

## 2023-01-09 DIAGNOSIS — R55 Syncope and collapse: Secondary | ICD-10-CM

## 2023-01-09 DIAGNOSIS — I491 Atrial premature depolarization: Secondary | ICD-10-CM

## 2023-01-09 NOTE — Addendum Note (Signed)
Addended by: [name] on: 01/09/2023 09:46 AM   Modules accepted: Orders

## 2023-01-12 NOTE — Progress Notes (Signed)
[facility] PRE[facility] Weekly Session  Patient Details  Name: Natalie Guerrero MRN: [id number] Date of Birth: 12/11/1965 Age: 57 y.o. PCP: [name], MD  Vitals:   01/12/23 1555  Weight: 238 lb 6.4 oz (108.1 kg)     [facility] Weekly seesion - 01/12/23 1500       [facility] "PREP" Location   [facility] "PREP" Location [facility]      Weekly Session   Topic Discussed Restaurant Eating   Limit salt intake to 1500-2300 mg/day; salt demo   Classes attended to date 63             Sonia Baller 01/12/2023, 3:57 PM

## 2023-01-13 ENCOUNTER — Encounter: Payer: Self-pay | Admitting: Family Medicine

## 2023-01-13 ENCOUNTER — Other Ambulatory Visit: Payer: Self-pay | Admitting: Family Medicine

## 2023-01-13 ENCOUNTER — Encounter: Payer: Self-pay | Admitting: Internal Medicine

## 2023-01-13 DIAGNOSIS — E66813 Obesity, class 3: Secondary | ICD-10-CM

## 2023-01-13 NOTE — Progress Notes (Unsigned)
EP to read

## 2023-01-14 ENCOUNTER — Other Ambulatory Visit: Payer: Self-pay

## 2023-01-14 DIAGNOSIS — R051 Acute cough: Secondary | ICD-10-CM

## 2023-01-14 MED ORDER — BENZONATATE 100 MG PO CAPS: 100.0000 mg | ORAL_CAPSULE | Freq: Three times a day (TID) | ORAL | 0 refills | Status: AC | PRN

## 2023-01-15 DIAGNOSIS — M533 Sacrococcygeal disorders, not elsewhere classified: Secondary | ICD-10-CM | POA: Diagnosis not present

## 2023-01-19 ENCOUNTER — Encounter: Payer: Self-pay | Admitting: Internal Medicine

## 2023-01-19 NOTE — Progress Notes (Signed)
[facility] PRE[facility] Weekly Session  Patient Details  Name: Natalie Guerrero MRN: [id number] Date of Birth: 09-25-1965 Age: 57 y.o. PCP: [name], MD  Vitals:   01/19/23 1541  Weight: 238 lb 9.6 oz (108.2 kg)     [facility] Weekly seesion - 01/19/23 1500       [facility] "PREP" Location   [facility] "PREP" Location [facility]      Weekly Session   Topic Discussed Stress management and problem solving   Importance of sleep w/goal of 7-9 hrs/night; finger tip mudra breathwork exercise; sleep guidelines   Minutes exercised this week 40 minutes    Classes attended to date 8             Gracelin Weisberg B Carlyne Keehan 01/19/2023, 3:43 PM

## 2023-01-20 ENCOUNTER — Ambulatory Visit: Payer: Medicare HMO | Admitting: Internal Medicine

## 2023-01-20 DIAGNOSIS — M5430 Sciatica, unspecified side: Secondary | ICD-10-CM | POA: Diagnosis not present

## 2023-01-20 DIAGNOSIS — R202 Paresthesia of skin: Secondary | ICD-10-CM | POA: Diagnosis not present

## 2023-01-20 DIAGNOSIS — Z79891 Long term (current) use of opiate analgesic: Secondary | ICD-10-CM | POA: Diagnosis not present

## 2023-01-20 DIAGNOSIS — G894 Chronic pain syndrome: Secondary | ICD-10-CM | POA: Diagnosis not present

## 2023-01-20 DIAGNOSIS — M179 Osteoarthritis of knee, unspecified: Secondary | ICD-10-CM | POA: Diagnosis not present

## 2023-01-22 DIAGNOSIS — Z008 Encounter for other general examination: Secondary | ICD-10-CM | POA: Diagnosis not present

## 2023-01-22 DIAGNOSIS — M62838 Other muscle spasm: Secondary | ICD-10-CM | POA: Diagnosis not present

## 2023-01-22 DIAGNOSIS — M1990 Unspecified osteoarthritis, unspecified site: Secondary | ICD-10-CM | POA: Diagnosis not present

## 2023-01-22 DIAGNOSIS — G629 Polyneuropathy, unspecified: Secondary | ICD-10-CM | POA: Diagnosis not present

## 2023-01-22 DIAGNOSIS — F419 Anxiety disorder, unspecified: Secondary | ICD-10-CM | POA: Diagnosis not present

## 2023-01-22 DIAGNOSIS — J302 Other seasonal allergic rhinitis: Secondary | ICD-10-CM | POA: Diagnosis not present

## 2023-01-22 DIAGNOSIS — H409 Unspecified glaucoma: Secondary | ICD-10-CM | POA: Diagnosis not present

## 2023-01-22 DIAGNOSIS — E039 Hypothyroidism, unspecified: Secondary | ICD-10-CM | POA: Diagnosis not present

## 2023-01-22 DIAGNOSIS — K5900 Constipation, unspecified: Secondary | ICD-10-CM | POA: Diagnosis not present

## 2023-01-22 DIAGNOSIS — M4800 Spinal stenosis, site unspecified: Secondary | ICD-10-CM | POA: Diagnosis not present

## 2023-01-22 DIAGNOSIS — R32 Unspecified urinary incontinence: Secondary | ICD-10-CM | POA: Diagnosis not present

## 2023-01-22 DIAGNOSIS — K219 Gastro-esophageal reflux disease without esophagitis: Secondary | ICD-10-CM | POA: Diagnosis not present

## 2023-01-23 ENCOUNTER — Ambulatory Visit (HOSPITAL_COMMUNITY)
Admission: RE | Admit: 2023-01-23 | Discharge: 2023-01-23 | Disposition: A | Discharge status: Home / Self Care | Payer: Medicare HMO | Source: Ambulatory Visit | Attending: Internal Medicine | Admitting: Internal Medicine

## 2023-01-23 DIAGNOSIS — R55 Syncope and collapse: Secondary | ICD-10-CM | POA: Diagnosis not present

## 2023-01-23 LAB — ECHOCARDIOGRAM COMPLETE
AR max vel: 2.7 cm2
AV Area VTI: 2.65 cm2
AV Area mean vel: 2.67 cm2
AV Mean grad: 4 mm[Hg]
AV Peak grad: 6.6 mm[Hg]
Ao pk vel: 1.28 m/s
Area-P 1/2: 5.02 cm2
MV M vel: 3.62 m/s
MV Peak grad: 52.4 mm[Hg]
S' Lateral: 3.24 cm

## 2023-01-25 ENCOUNTER — Encounter: Payer: Self-pay | Admitting: Internal Medicine

## 2023-01-26 DIAGNOSIS — I491 Atrial premature depolarization: Secondary | ICD-10-CM | POA: Diagnosis not present

## 2023-01-26 DIAGNOSIS — R55 Syncope and collapse: Secondary | ICD-10-CM

## 2023-01-26 NOTE — Progress Notes (Signed)
[facility] PREP Weekly Session  Patient Details  Name: Natalie Guerrero MRN: [id number] Date of Birth: Sep 13, 1965 Age: 57 y.o. PCP: [name], MD  There were no vitals filed for this visit.   [facility] Weekly seesion - 01/26/23 1600       [facility] "PREP" Location   [facility] "PREP" Location [facility]      Weekly Session   Topic Discussed Other   Portion control; visualize your portion size demo; review of [employer] food/nutrition label   Classes attended to date 77             Ameka Krigbaum B Daiana Vitiello 01/26/2023, 4:36 PM

## 2023-01-27 ENCOUNTER — Other Ambulatory Visit: Payer: Self-pay | Admitting: Internal Medicine

## 2023-01-27 DIAGNOSIS — J309 Allergic rhinitis, unspecified: Secondary | ICD-10-CM

## 2023-01-30 ENCOUNTER — Ambulatory Visit: Payer: Medicare HMO

## 2023-02-10 DIAGNOSIS — H52223 Regular astigmatism, bilateral: Secondary | ICD-10-CM | POA: Diagnosis not present

## 2023-02-17 DIAGNOSIS — R55 Syncope and collapse: Secondary | ICD-10-CM | POA: Diagnosis not present

## 2023-02-18 NOTE — Progress Notes (Signed)
 Received message that she and her husband have decided to withdraw from the PREP program.  Education classes attended: 5 Workout sessions completed: 4 Last class attended: 01/26/23

## 2023-02-20 ENCOUNTER — Encounter: Payer: Self-pay | Admitting: Internal Medicine

## 2023-02-23 ENCOUNTER — Other Ambulatory Visit: Payer: Self-pay | Admitting: Internal Medicine

## 2023-02-23 DIAGNOSIS — M5135 Other intervertebral disc degeneration, thoracolumbar region: Secondary | ICD-10-CM | POA: Diagnosis not present

## 2023-02-23 DIAGNOSIS — G894 Chronic pain syndrome: Secondary | ICD-10-CM | POA: Diagnosis not present

## 2023-02-23 DIAGNOSIS — R202 Paresthesia of skin: Secondary | ICD-10-CM | POA: Diagnosis not present

## 2023-02-23 DIAGNOSIS — Z79891 Long term (current) use of opiate analgesic: Secondary | ICD-10-CM | POA: Diagnosis not present

## 2023-02-23 DIAGNOSIS — M179 Osteoarthritis of knee, unspecified: Secondary | ICD-10-CM | POA: Diagnosis not present

## 2023-03-01 ENCOUNTER — Other Ambulatory Visit: Payer: Self-pay | Admitting: Internal Medicine

## 2023-03-10 DIAGNOSIS — G629 Polyneuropathy, unspecified: Secondary | ICD-10-CM | POA: Diagnosis not present

## 2023-03-23 DIAGNOSIS — Z79891 Long term (current) use of opiate analgesic: Secondary | ICD-10-CM | POA: Diagnosis not present

## 2023-03-23 DIAGNOSIS — M5430 Sciatica, unspecified side: Secondary | ICD-10-CM | POA: Diagnosis not present

## 2023-03-23 DIAGNOSIS — R202 Paresthesia of skin: Secondary | ICD-10-CM | POA: Diagnosis not present

## 2023-03-23 DIAGNOSIS — G894 Chronic pain syndrome: Secondary | ICD-10-CM | POA: Diagnosis not present

## 2023-03-23 DIAGNOSIS — M179 Osteoarthritis of knee, unspecified: Secondary | ICD-10-CM | POA: Diagnosis not present

## 2023-03-24 DIAGNOSIS — H401131 Primary open-angle glaucoma, bilateral, mild stage: Secondary | ICD-10-CM | POA: Diagnosis not present

## 2023-03-24 DIAGNOSIS — H40012 Open angle with borderline findings, low risk, left eye: Secondary | ICD-10-CM | POA: Diagnosis not present

## 2023-03-24 DIAGNOSIS — H16223 Keratoconjunctivitis sicca, not specified as Sjogren's, bilateral: Secondary | ICD-10-CM | POA: Diagnosis not present

## 2023-03-30 ENCOUNTER — Other Ambulatory Visit: Payer: Self-pay | Admitting: Internal Medicine

## 2023-03-30 DIAGNOSIS — J309 Allergic rhinitis, unspecified: Secondary | ICD-10-CM

## 2023-04-01 ENCOUNTER — Encounter: Payer: Self-pay | Admitting: Podiatry

## 2023-04-01 ENCOUNTER — Ambulatory Visit (INDEPENDENT_AMBULATORY_CARE_PROVIDER_SITE_OTHER): Payer: Medicare HMO

## 2023-04-01 ENCOUNTER — Ambulatory Visit (INDEPENDENT_AMBULATORY_CARE_PROVIDER_SITE_OTHER): Payer: Medicare HMO | Admitting: Podiatry

## 2023-04-01 DIAGNOSIS — M79671 Pain in right foot: Secondary | ICD-10-CM | POA: Diagnosis not present

## 2023-04-01 DIAGNOSIS — M722 Plantar fascial fibromatosis: Secondary | ICD-10-CM | POA: Diagnosis not present

## 2023-04-01 DIAGNOSIS — G8929 Other chronic pain: Secondary | ICD-10-CM

## 2023-04-01 MED ORDER — TRIAMCINOLONE ACETONIDE 10 MG/ML IJ SUSP
10.0000 mg | Freq: Once | INTRAMUSCULAR | Status: AC
  Administered 2023-04-01: 10 mg via INTRA_ARTICULAR

## 2023-04-02 NOTE — Progress Notes (Signed)
Subjective:   Patient ID: Natalie Guerrero, female   DOB: 58 y.o.   MRN: [id number]   HPI Patient states reoccurrence of severe pain right plantar heel with no history of injury   ROS      Objective:  Physical Exam  Neuro vascular status intact exquisite discomfort medial fascial band right at the insertional point tendon into the calcaneus     Assessment:  Acute plantar fasciitis right inflammation fluid     Plan:  H&P reviewed sterile prep and did go ahead today due to the acute nature of condition injected the plantar fascia right 3 mg Kenalog 5 mg Xylocaine advised on support and discussed possible immobilization if symptoms persist.  Reappoint to recheck  X-rays do indicate small spur no indication stress fracture arthritis

## 2023-04-20 ENCOUNTER — Ambulatory Visit: Payer: Medicare HMO | Admitting: Podiatry

## 2023-04-20 DIAGNOSIS — G894 Chronic pain syndrome: Secondary | ICD-10-CM | POA: Diagnosis not present

## 2023-04-20 DIAGNOSIS — M5135 Other intervertebral disc degeneration, thoracolumbar region: Secondary | ICD-10-CM | POA: Diagnosis not present

## 2023-04-20 DIAGNOSIS — R202 Paresthesia of skin: Secondary | ICD-10-CM | POA: Diagnosis not present

## 2023-04-20 DIAGNOSIS — M62830 Muscle spasm of back: Secondary | ICD-10-CM | POA: Diagnosis not present

## 2023-04-20 DIAGNOSIS — M5430 Sciatica, unspecified side: Secondary | ICD-10-CM | POA: Diagnosis not present

## 2023-04-20 DIAGNOSIS — M179 Osteoarthritis of knee, unspecified: Secondary | ICD-10-CM | POA: Diagnosis not present

## 2023-05-13 ENCOUNTER — Emergency Department (HOSPITAL_COMMUNITY)
Admission: EM | Admit: 2023-05-13 | Discharge: 2023-05-13 | Disposition: A | Discharge status: Home / Self Care | Attending: Emergency Medicine | Admitting: Emergency Medicine

## 2023-05-13 DIAGNOSIS — R059 Cough, unspecified: Secondary | ICD-10-CM | POA: Insufficient documentation

## 2023-05-13 DIAGNOSIS — E039 Hypothyroidism, unspecified: Secondary | ICD-10-CM | POA: Insufficient documentation

## 2023-05-13 DIAGNOSIS — Z79899 Other long term (current) drug therapy: Secondary | ICD-10-CM | POA: Diagnosis not present

## 2023-05-13 DIAGNOSIS — R42 Dizziness and giddiness: Secondary | ICD-10-CM | POA: Diagnosis not present

## 2023-05-13 DIAGNOSIS — R55 Syncope and collapse: Secondary | ICD-10-CM | POA: Diagnosis not present

## 2023-05-13 DIAGNOSIS — I959 Hypotension, unspecified: Secondary | ICD-10-CM | POA: Diagnosis not present

## 2023-05-13 DIAGNOSIS — Z743 Need for continuous supervision: Secondary | ICD-10-CM | POA: Diagnosis not present

## 2023-05-13 DIAGNOSIS — G4489 Other headache syndrome: Secondary | ICD-10-CM | POA: Diagnosis not present

## 2023-05-13 DIAGNOSIS — M549 Dorsalgia, unspecified: Secondary | ICD-10-CM | POA: Diagnosis not present

## 2023-05-13 LAB — COMPREHENSIVE METABOLIC PANEL WITH GFR
ALT: 21 U/L (ref 0–44)
AST: 30 U/L (ref 15–41)
Albumin: 3.3 g/dL — ABNORMAL LOW (ref 3.5–5.0)
Alkaline Phosphatase: 62 U/L (ref 38–126)
Anion gap: 9 (ref 5–15)
BUN: 14 mg/dL (ref 6–20)
CO2: 20 mmol/L — ABNORMAL LOW (ref 22–32)
Calcium: 8.1 mg/dL — ABNORMAL LOW (ref 8.9–10.3)
Chloride: 105 mmol/L (ref 98–111)
Creatinine, Ser: 0.98 mg/dL (ref 0.44–1.00)
GFR, Estimated: >60 mL/min (ref >60)
Glucose, Bld: 82 mg/dL (ref 70–99)
Potassium: 4.3 mmol/L (ref 3.5–5.1)
Sodium: 134 mmol/L — ABNORMAL LOW (ref 135–145)
Total Bilirubin: 0.3 mg/dL (ref 0.0–1.2)
Total Protein: 6.4 g/dL — ABNORMAL LOW (ref 6.5–8.1)

## 2023-05-13 LAB — CBG MONITORING, ED: Glucose-Capillary: 81 mg/dL (ref 70–99)

## 2023-05-13 LAB — CBC WITH DIFFERENTIAL/PLATELET
Abs Immature Granulocytes: 0.01 10*3/uL (ref 0.00–0.07)
Basophils Absolute: 0 10*3/uL (ref 0.0–0.1)
Basophils Relative: 0 %
Eosinophils Absolute: 0.1 10*3/uL (ref 0.0–0.5)
Eosinophils Relative: 1 %
HCT: 31.4 % — ABNORMAL LOW (ref 36.0–46.0)
Hemoglobin: 9.7 g/dL — ABNORMAL LOW (ref 12.0–15.0)
Immature Granulocytes: 0 %
Lymphocytes Relative: 51 %
Lymphs Abs: 3 10*3/uL (ref 0.7–4.0)
MCH: 32.4 pg (ref 26.0–34.0)
MCHC: 30.9 g/dL (ref 30.0–36.0)
MCV: 105 fL — ABNORMAL HIGH (ref 80.0–100.0)
Monocytes Absolute: 0.5 10*3/uL (ref 0.1–1.0)
Monocytes Relative: 8 %
Neutro Abs: 2.3 10*3/uL (ref 1.7–7.7)
Neutrophils Relative %: 40 %
Platelets: 278 10*3/uL (ref 150–400)
RBC: 2.99 MIL/uL — ABNORMAL LOW (ref 3.87–5.11)
RDW: 13.4 % (ref 11.5–15.5)
WBC: 5.9 10*3/uL (ref 4.0–10.5)
nRBC: 0 % (ref 0.0–0.2)

## 2023-05-13 MED ORDER — SODIUM CHLORIDE 0.9 % IV BOLUS
500.0000 mL | Freq: Once | INTRAVENOUS | Status: AC
  Administered 2023-05-13: 500 mL via INTRAVENOUS

## 2023-05-13 MED ORDER — SODIUM CHLORIDE 0.9 % IV BOLUS: 500.0000 mL | Freq: Once | INTRAVENOUS | Status: DC

## 2023-05-13 NOTE — ED Provider Notes (Signed)
  EMERGENCY DEPARTMENT AT [facility] Provider Note   CSN: [id number] Arrival date & time: 05/13/23  1845     History  Chief Complaint  Patient presents with   Dizziness   Hypotension   Loss of Consciousness    Natalie Guerrero is a 58 y.o. female with PMHx HLD, hypothyroidism, IDA, anxiety, OA, depression, GERD, who presents to ED concerned for pre-syncopal episode after standing. Patient stood up and started feeling dizzy. Patient unsure if she fully loss consciousness, but states that her husband had to help her down to the ground. Patient denies other prodromal symptoms such as chest pain or SOB before pre-syncopal episode happened. Endorses an intermittent cough that they state is d/t their environmental allergies. Patient denies any pain or injuries from her pre-syncopal episode today. Patient states that this is a somewhat recurrent issue for her and she follows with cardiology and has an appointment later this month. Patient stating that she had a holter monitor in the recent past which did not show any events.   Denies fever, chest pain, dyspnea, nausea, vomiting, diarrhea, dysuria, hematuria, hematochezia.    Dizziness Associated symptoms: syncope   Loss of Consciousness Associated symptoms: dizziness        Home Medications Prior to Admission medications   Medication Sig Start Date End Date Taking? Authorizing Provider  b complex vitamins capsule Take 1 capsule by mouth daily.    [provider]  benzonatate (TESSALON PERLES) 100 MG capsule Take 1 capsule (100 mg total) by mouth 3 (three) times daily as needed for cough. 01/14/23 01/14/24  Ellender Hose, NP  brimonidine (ALPHAGAN) 0.2 % ophthalmic solution Place 1 drop into the right eye 2 (two) times daily.    [provider]  buPROPion (WELLBUTRIN XL) 300 MG 24 hr tablet TAKE 1 TABLET (300 MG TOTAL) BY MOUTH EVERY MORNING. 03/02/23 03/01/24  [name], MD  cetirizine  (ZYRTEC) 10 MG tablet Take 10 mg by mouth daily.    [provider]  Cholecalciferol (VITAMIN D3) 50 MCG (2000 UT) capsule Take 2,000 Units by mouth daily.    [provider]  Collagen Hydrolysate POWD by Does not apply route.    [provider]  docusate sodium (COLACE) 100 MG capsule Take 200 mg by mouth 2 (two) times daily.     [provider]  FIBER PO Take 4 capsules by mouth daily. prn    [provider]  folic acid (FOLVITE) 400 MCG tablet Take 400 mcg by mouth daily.    [provider]  gabapentin (NEURONTIN) 300 MG capsule Take 300 mg by mouth 3 (three) times daily. 06/10/22   [provider]  guaiFENesin (MUCINEX) 600 MG 12 hr tablet Take by mouth 2 (two) times daily.    [provider]  ibuprofen (ADVIL) 800 MG tablet Take 800 mg by mouth every 8 (eight) hours as needed.    [provider]  imipramine (TOFRANIL) 50 MG tablet 50 mg 2 (two) times daily.    [provider]  levothyroxine (SYNTHROID) 100 MCG tablet TAKE 1 TABLET MON-FRI AND TAKE 1/2 PILL ON SATURDAY/SUNDAY. 02/23/23   [name], MD  Magnesium 200 MG TABS Take 4 tablets by mouth daily.    [provider]  montelukast (SINGULAIR) 10 MG tablet TAKE 1 TABLET BY MOUTH EVERY DAY 03/30/23   [name], MD  naloxone [facility]) nasal spray 4 mg/0.1 mL See admin instructions.    [provider]  Maudry Mayhew  AD 4-10-325 MG TABS TAKE 1 TABLET BY MOUTH TWICE A DAY AS NEEDED 01/29/23   [name], MD  omeprazole (PRILOSEC) 40 MG capsule TAKE 1 CAPSULE BY MOUTH EVERY DAY 06/10/22   [name], MD  OVER THE COUNTER MEDICATION Tea made with ginger root and tumeric root.    [provider]  Oxycodone HCl 20 MG TABS SMARTSIG:0.5-1 Tablet(s) By Mouth 4-6 Times Daily 04/08/21   [provider]  phentermine 30 MG capsule Take 1 capsule (30 mg total) by mouth every morning. 12/10/22   Ellender Hose, NP  RESTASIS 0.05 %  ophthalmic emulsion Place 1 drop into both eyes 2 (two) times daily. 06/04/22   [provider]  tiZANidine (ZANAFLEX) 4 MG tablet every 4 (four) hours as needed. 06/27/19   [provider]  UNABLE TO FIND Med Name: Beet Chews take 2 chews daily    [provider]  valACYclovir (VALTREX) 500 MG tablet as needed. 04/27/19   [provider]  Zinc Citrate (ZINC GUMMY PO) Take by mouth daily.    [provider]      Allergies    Codeine    Review of Systems   Review of Systems  Cardiovascular:  Positive for syncope.  Neurological:  Positive for dizziness.    Physical Exam Updated Vital Signs BP 124/74   Pulse 80   Temp 97.7 F (36.5 C) (Oral)   Resp 18   Ht 5\' 5"  (1.651 m)   Wt 110.2 kg   SpO2 100%   BMI 40.44 kg/m  Physical Exam Vitals and nursing note reviewed.  Constitutional:      General: She is not in acute distress.    Appearance: She is not ill-appearing or toxic-appearing.  HENT:     Head: Normocephalic and atraumatic.     Mouth/Throat:     Mouth: Mucous membranes are moist.  Eyes:     General: No scleral icterus.       Right eye: No discharge.        Left eye: No discharge.     Conjunctiva/sclera: Conjunctivae normal.  Cardiovascular:     Rate and Rhythm: Normal rate and regular rhythm.     Pulses: Normal pulses.     Heart sounds: Normal heart sounds. No murmur heard. Pulmonary:     Effort: Pulmonary effort is normal. No respiratory distress.     Breath sounds: Normal breath sounds. No wheezing, rhonchi or rales.  Abdominal:     General: Abdomen is flat.  Musculoskeletal:     Right lower leg: No edema.     Left lower leg: No edema.  Skin:    General: Skin is warm and dry.     Findings: No rash.  Neurological:     General: No focal deficit present.     Mental Status: She is alert and oriented to person, place, and time. Mental status is at baseline.     Comments: GCS 15. Speech is goal oriented. No deficits  appreciated to CN III-XII. Patient moves extremities without ataxia.  Psychiatric:        Mood and Affect: Mood normal.     ED Results / Procedures / Treatments   Labs (all labs ordered are listed, but only abnormal results are displayed) Labs Reviewed  CBC WITH DIFFERENTIAL/PLATELET - Abnormal; Notable for the following components:      Result Value   RBC 2.99 (*)    Hemoglobin 9.7 (*)    HCT 31.4 (*)  MCV 105.0 (*)    All other components within normal limits  COMPREHENSIVE METABOLIC PANEL WITH GFR - Abnormal; Notable for the following components:   Sodium 134 (*)    CO2 20 (*)    Calcium 8.1 (*)    Total Protein 6.4 (*)    Albumin 3.3 (*)    All other components within normal limits  CBG MONITORING, ED    EKG EKG Interpretation Date/Time:  Wednesday May 13 2023 19:11:28 EDT Ventricular Rate:  79 PR Interval:  183 QRS Duration:  92 QT Interval:  374 QTC Calculation: 429 R Axis:   63  Text Interpretation: Sinus rhythm Consider left atrial enlargement No acute changes No significant change since last tracing Confirmed by Derwood Kaplan [phone]) on 05/13/2023 9:06:53 PM  Radiology No results found.  Procedures Procedures    Medications Ordered in ED Medications  sodium chloride 0.9 % bolus 500 mL (500 mLs Intravenous Patient Refused/Not Given 05/13/23 2209)  sodium chloride 0.9 % bolus 500 mL (0 mLs Intravenous Stopped 05/13/23 2107)    ED Course/ Medical Decision Making/ A&P Clinical Course as of 05/13/23 2303  Wed May 13, 2023  2158 Creatinine: 0.98 [SM]    Clinical Course User Index [SM] Dorthy Cooler, PA-C                                 Medical Decision Making Amount and/or Complexity of Data Reviewed Labs: ordered. Decision-making details documented in ED Course.   This patient presents to the ED for concern of syncope, this involves an extensive number of treatment options, and is a complaint that carries with it a high risk of  complications and morbidity.  The differential diagnosis includes CVA, ICH, intracranial mass, critical dehydration, endocrine abnormality, sepsis/infection, electrolyte abnormality, cardiac arrhythmia.   Co morbidities that complicate the patient evaluation  HLD, hypothyroidism, IDA, anxiety, OA, depression, GERD   Additional history obtained:  Dr. Allyne Gee PCP    Problem List / ED Course / Critical interventions / Medication management  Patient presents to ED concerned for pre-syncopal episode - patient unsure if she loss consciousness and her husband was able to help her down to the ground to prevent any injuries. No alarming prodromal symptoms. No recent infectious symptoms. Patient wondering if she has POTS disorder. Patient follows with outpatient cardiology with an appointment later this month.  Physical exam with initial hypotension that resolved with 1L IV fluids. Rest of physical exam unremarkable. Patient afebrile with stable vitals.  I Ordered, and personally interpreted labs. CMP with mild hyponatremia at 134 and mildly low CO2 at 20 - no anion gap. CBC with anemia - hgb 9.7. no leukocytosis. CBG 81. The patient was maintained on a cardiac monitor.  I personally viewed and interpreted the EKG/cardiac monitored which showed an underlying rhythm of: sinus rhythm. Shared all results with patient. Answered all questions. It appears that patient experienced a vaso-vagal episode. Patient's orthostatic vitals were mildly concerning in ED, but patient refusing further IV fluids stating that she feels good and wants to go home. Patient asymptomatic during orthostatic vitals. Educated patient that using electrolyte replacement drinks may help prevent future vaso-vagal episodes as patient was stating that she drinks a lot of water and does not eat much proteins. I recommended following up with PCP and outpatient cardiologist. Patient verbalized understanding of plan. I have reviewed the  patients home medicines and have made adjustments as needed  Staffed with Dr. Rhunette Croft who agrees with plan. Patient was given return precautions. Patient stable for discharge at this time.  Patient verbalized understanding of plan.  Ddx: these are considered less likely due to history of present illness and physical exam -CVA/ICH/intracranial mass: patient without neurodeficits; no history of seizure -Critical dehydration: BMP/CMP without concern -Sepsis/infection: SIRs criteria not met; patient afebrile without infectious symptoms -Cardiac arrhythmia: EKG reassuring   Social Determinants of Health:  none          Final Clinical Impression(s) / ED Diagnoses Final diagnoses:  Vaso vagal episode    Rx / DC Orders ED Discharge Orders     None         Margarita Rana 05/13/23 2303    Derwood Kaplan, MD 05/14/23 [phone]

## 2023-05-13 NOTE — ED Triage Notes (Signed)
 Pt bib gcems had a syncopal episode.  98 initial bp then dropped to 74.  Slow to respond initially.  Dizziness, hypotension, syncope when standing.

## 2023-05-13 NOTE — Discharge Instructions (Addendum)
 I am glad you are feeling better.  Please follow-up with your primary care provider and cardiologist appointment later this month.  Seek emergency care if experiencing any new or worsening symptoms.

## 2023-05-15 ENCOUNTER — Other Ambulatory Visit: Payer: Self-pay | Admitting: Internal Medicine

## 2023-05-18 DIAGNOSIS — R202 Paresthesia of skin: Secondary | ICD-10-CM | POA: Diagnosis not present

## 2023-05-18 DIAGNOSIS — Z79891 Long term (current) use of opiate analgesic: Secondary | ICD-10-CM | POA: Diagnosis not present

## 2023-05-18 DIAGNOSIS — M179 Osteoarthritis of knee, unspecified: Secondary | ICD-10-CM | POA: Diagnosis not present

## 2023-05-18 DIAGNOSIS — G894 Chronic pain syndrome: Secondary | ICD-10-CM | POA: Diagnosis not present

## 2023-05-18 DIAGNOSIS — M5135 Other intervertebral disc degeneration, thoracolumbar region: Secondary | ICD-10-CM | POA: Diagnosis not present

## 2023-05-18 DIAGNOSIS — M62830 Muscle spasm of back: Secondary | ICD-10-CM | POA: Diagnosis not present

## 2023-05-19 DIAGNOSIS — M533 Sacrococcygeal disorders, not elsewhere classified: Secondary | ICD-10-CM | POA: Diagnosis not present

## 2023-05-20 ENCOUNTER — Encounter: Payer: Self-pay | Admitting: Internal Medicine

## 2023-05-27 DIAGNOSIS — G894 Chronic pain syndrome: Secondary | ICD-10-CM | POA: Diagnosis not present

## 2023-05-27 DIAGNOSIS — Z79891 Long term (current) use of opiate analgesic: Secondary | ICD-10-CM | POA: Diagnosis not present

## 2023-06-01 ENCOUNTER — Ambulatory Visit: Payer: Medicare HMO | Attending: Internal Medicine | Admitting: Internal Medicine

## 2023-06-01 VITALS — BP 106/71 | HR 91 | Ht 65.0 in | Wt 252.0 lb

## 2023-06-01 DIAGNOSIS — I491 Atrial premature depolarization: Secondary | ICD-10-CM

## 2023-06-01 DIAGNOSIS — I471 Supraventricular tachycardia, unspecified: Secondary | ICD-10-CM | POA: Diagnosis not present

## 2023-06-01 DIAGNOSIS — I951 Orthostatic hypotension: Secondary | ICD-10-CM | POA: Diagnosis not present

## 2023-06-01 NOTE — Patient Instructions (Addendum)
 Follow-Up: At [facility], you and your health needs are our priority.  As part of our continuing mission to provide you with exceptional heart care, our providers are all part of one team.  This team includes your primary Cardiologist (physician) and Advanced Practice Providers or APPs (Physician Assistants and Nurse Practitioners) who all work together to provide you with the care you need, when you need it.  Your next appointment:   As needed   Provider:   Dr. Paulita Boss  Other Instructions      1st Floor: - Lobby - Registration  - Pharmacy  - Lab - Cafe  2nd Floor: - PV Lab - Diagnostic Testing (echo, CT, nuclear med)  3rd Floor: - Vacant  4th Floor: - TCTS (cardiothoracic surgery) - AFib Clinic - Structural Heart Clinic - Vascular Surgery  - Vascular Ultrasound  5th Floor: - HeartCare Cardiology (general and EP) - Clinical Pharmacy for coumadin, hypertension, lipid, weight-loss medications, and med management appointments    Valet parking services will be available as well.

## 2023-06-01 NOTE — Progress Notes (Signed)
 Cardiology Office Note:  .    Date:  06/01/2023  ID:  Natalie Guerrero, DOB 10-27-1965, MRN [id number] PCP: Cleave Curling, MD  [facility] HeartCare Providers Cardiologist:  None     CC: near syncope Consulted for the evaluation of cardiac syncope at the behest of Dr. Elnita Hai   History of Present Illness: .    Natalie Guerrero is a 58 y.o. female with hyperlipidemia and a heart murmur who presents with near syncope.  She has experienced five episodes of near syncope over the past year, characterized by lightheadedness, a spinning sensation in her head, and a feeling of 'blanking out' leading to falls. These episodes have occurred in various situations, such as while cooking or standing by the laundry machine. During the most recent episode, her husband was present, and she was able to alert him before she fell.  She reports a nagging chest pain that has persisted for six to eight months. The pain is localized, and she can pinpoint its location. Despite multiple evaluations, including auscultation of her heart, no abnormalities have been found. She is unsure if the pain changes with activity as she is not very active.  She drinks a gallon of water daily but was informed by her primary care doctor that she is dehydrated due to low salt intake. She has since increased her salt intake and started drinking Gatorade, which initially seemed to help with the dizziness, but she still experiences some dizziness.  Her past medical history includes hyperlipidemia and a heart murmur. An echocardiogram showed mild calcification of her aortic and mitral valves with normal strain and no significant valve disease. A heart monitor revealed short runs of nonsymptomatic supraventricular tachycardia and occasional premature atrial contractions.  Family history is significant for her mother having had heart failure, a stroke, and fluid on her lungs before passing away.   Relevant histories:  .  Social - married; her husband is part of the practice ROS: As per HPI.   Studies Reviewed: .   Cardiac Studies & Procedures   ______________________________________________________________________________________________     ECHOCARDIOGRAM  ECHOCARDIOGRAM COMPLETE 01/23/2023  Narrative ECHOCARDIOGRAM REPORT    Patient Name:   Natalie Guerrero Date of Exam: 01/23/2023 Medical Rec #:  [id number]                  Height:       64.0 in Accession #:    [id number]                 Weight:       238.6 lb Date of Birth:  08-Dec-1965                 BSA:          2.109 m Patient Age:    56 years                   BP:           130/77 mmHg Patient Gender: F                          HR:           75 bpm. Exam Location:  Outpatient  Procedure: 2D Echo, 3D Echo, Cardiac Doppler, Color Doppler and Strain Analysis  Indications:    Syncope, unspecified syncope type [R55 (ICD-10-CM)]  History:        Patient has no prior history of Echocardiogram examinations.  Arrythmias:PAC; Risk Factors:Dyslipidemia and Non-Smoker. Orthostatic hypotension.  Sonographer:    Delford Felling MHA, RDMS, RVT, RDCS Referring Phys: [id number] [facility] SANDERS   Sonographer Comments: Global longitudinal strain was attempted. IMPRESSIONS   1. Left ventricular ejection fraction, by estimation, is 55 to 60%. The left ventricle has normal function. The left ventricle has no regional wall motion abnormalities. Left ventricular diastolic parameters were normal. The average left ventricular global longitudinal strain is -18.7 %. The global longitudinal strain is normal. 2. Right ventricular systolic function is normal. The right ventricular size is normal. There is normal pulmonary artery systolic pressure. 3. The mitral valve is abnormal. Trivial mitral valve regurgitation. No evidence of mitral stenosis. 4. The aortic valve is tricuspid. There is mild calcification of the aortic valve. Aortic valve  regurgitation is not visualized. Aortic valve sclerosis is present, with no evidence of aortic valve stenosis. 5. The inferior vena cava is normal in size with greater than 50% respiratory variability, suggesting right atrial pressure of 3 mmHg.  FINDINGS Left Ventricle: Left ventricular ejection fraction, by estimation, is 55 to 60%. The left ventricle has normal function. The left ventricle has no regional wall motion abnormalities. The average left ventricular global longitudinal strain is -18.7 %. The global longitudinal strain is normal. The left ventricular internal cavity size was normal in size. There is no left ventricular hypertrophy. Left ventricular diastolic parameters were normal.  Right Ventricle: The right ventricular size is normal. No increase in right ventricular wall thickness. Right ventricular systolic function is normal. There is normal pulmonary artery systolic pressure. The tricuspid regurgitant velocity is 2.39 m/s, and with an assumed right atrial pressure of 3 mmHg, the estimated right ventricular systolic pressure is 25.8 mmHg.  Left Atrium: Left atrial size was normal in size.  Right Atrium: Right atrial size was normal in size.  Pericardium: There is no evidence of pericardial effusion.  Mitral Valve: The mitral valve is abnormal. There is mild thickening of the mitral valve leaflet(s). Trivial mitral valve regurgitation. No evidence of mitral valve stenosis.  Tricuspid Valve: The tricuspid valve is normal in structure. Tricuspid valve regurgitation is not demonstrated. No evidence of tricuspid stenosis.  Aortic Valve: The aortic valve is tricuspid. There is mild calcification of the aortic valve. Aortic valve regurgitation is not visualized. Aortic valve sclerosis is present, with no evidence of aortic valve stenosis. Aortic valve mean gradient measures 4.0 mmHg. Aortic valve peak gradient measures 6.6 mmHg. Aortic valve area, by VTI measures 2.65 cm.  Pulmonic  Valve: The pulmonic valve was normal in structure. Pulmonic valve regurgitation is not visualized. No evidence of pulmonic stenosis.  Aorta: The aortic root is normal in size and structure.  Venous: The inferior vena cava is normal in size with greater than 50% respiratory variability, suggesting right atrial pressure of 3 mmHg.  IAS/Shunts: No atrial level shunt detected by color flow Doppler.   LEFT VENTRICLE PLAX 2D LVIDd:         5.03 cm   Diastology LVIDs:         3.24 cm   LV e' medial:    13.40 cm/s LV PW:         0.93 cm   LV E/e' medial:  6.6 LV IVS:        0.85 cm   LV e' lateral:   11.70 cm/s LVOT diam:     2.19 cm   LV E/e' lateral: 7.6 LV SV:         72  LV SV Index:   34        2D Longitudinal Strain LVOT Area:     3.77 cm  2D Strain GLS Avg:     -18.7 %  3D Volume EF: 3D EF:        57 % LV EDV:       146 ml LV ESV:       63 ml LV SV:        83 ml  RIGHT VENTRICLE RV Basal diam:  2.91 cm RV Mid diam:    3.01 cm RV S prime:     17.60 cm/s TAPSE (M-mode): 2.7 cm  LEFT ATRIUM             Index        RIGHT ATRIUM           Index LA diam:        3.59 cm 1.70 cm/m   RA Area:     13.00 cm LA Vol (A2C):   56.2 ml 26.65 ml/m  RA Volume:   28.20 ml  13.37 ml/m LA Vol (A4C):   55.1 ml 26.13 ml/m LA Biplane Vol: 55.9 ml 26.51 ml/m AORTIC VALVE AV Area (Vmax):    2.70 cm AV Area (Vmean):   2.67 cm AV Area (VTI):     2.65 cm AV Vmax:           128.00 cm/s AV Vmean:          87.300 cm/s AV VTI:            0.272 m AV Peak Grad:      6.6 mmHg AV Mean Grad:      4.0 mmHg LVOT Vmax:         91.70 cm/s LVOT Vmean:        61.800 cm/s LVOT VTI:          0.191 m LVOT/AV VTI ratio: 0.70  AORTA Ao Root diam: 3.18 cm  MITRAL VALVE               TRICUSPID VALVE MV Area (PHT): 5.02 cm    TR Peak grad:   22.8 mmHg MV Decel Time: 151 msec    TR Vmax:        239.00 cm/s MR Peak grad: 52.4 mmHg MR Vmax:      362.00 cm/s  SHUNTS MV E velocity: 89.10 cm/s  Systemic  VTI:  0.19 m MV A velocity: 73.40 cm/s  Systemic Diam: 2.19 cm MV E/A ratio:  1.21  Janelle Mediate MD Electronically signed by Janelle Mediate MD Signature Date/Time: 01/23/2023/4:30:19 PM    Final    MONITORS  LONG TERM MONITOR (3-14 DAYS) 02/17/2023  Narrative Patch Wear Time:  13 days and 23 hours (2024-12-16T10:00:05-498 to 2024-12-30T09:59:56-0500)  HR 59 - 164, average 83 bpm. 3 nonsustained SVT (longest 5 beats). These did not correlate with symptom trigger episodes. No atrial fibrillation detected. Occasional supraventricular ectopy, 2.6%. Rare ventricular ectopy. No sustained arrhythmias. Symptom trigger episodes correspond to primarily sinus rhythm.   Ardeen Kohler Cardiac Electrophysiology       ______________________________________________________________________________________________       Physical Exam:    VS:  BP 106/71 (BP Location: Left Arm)   Pulse 91   Ht 5\' 5"  (1.651 m)   Wt 114.3 kg   SpO2 99%   BMI 41.93 kg/m    Wt Readings from Last 3 Encounters:  06/01/23 114.3 kg  05/13/23 110.2 kg  01/19/23 108.2  kg    Gen: no distress   Cardiac: No Rubs or Gallops, no Murmur, RRR +2 radial pulses Respiratory: Clear to auscultation bilaterally, normal effort, normal  respiratory rate GI: Soft, nontender, non-distended  MS: No  edema;  moves all extremities Integument: Skin feels warm Neuro:  At time of evaluation, alert and oriented to person/place/time/situation  Psych: Normal affect, patient feels ok   ASSESSMENT AND PLAN: .    Near Syncope Natalie Guerrero experiences near syncope episodes characterized by lightheadedness and vertigo, leading to falls. These episodes are non-exertional and occur during activities such as cooking or standing by the laundry machine. Orthostatic hypotension was identified during a recent emergency room visit, but current orthostatic vitals have resolved. There is no evidence of POTS as the expected heart rate  increase is absent. The episodes may be related to orthostatic hypotension, and conservative management with hydration and salt intake is being pursued. - Continue conservative management with hydration and salt intake  Chest Pain Intermittent, non-exertional chest pain has been present for six to eight months, localized to a specific point on the chest. EKGs and heart monitors show no significant findings. The chest pain is atypical, with no current evidence of a cardiac origin. Given her family history of heart disease and risk factors, a stress test was offered but declined in favor of a conservative approach unless symptoms worsen. - Monitor chest pain for changes in frequency or severity - Revisit the need for further evaluation if chest pain becomes more exertional or frequent  Supraventricular Tachycardia (SVT) Short runs of asymptomatic supraventricular tachycardia with occasional premature atrial contractions were noted on a heart monitor. These episodes are benign and do not increase stroke risk. Treatment is not recommended as it may lower blood pressure and exacerbate symptoms. - No treatment for SVT at this time  Hyperlipidemia Slightly elevated cholesterol levels were noted. No specific treatment plan was discussed during this visit.  Follow-up She has access to a cardiologist for the next three years for any cardiac concerns. - Follow up with the cardiology team as needed (patient deferred stress testing at this time) - Re-evaluate if symptoms change or worsen  Natalie Larger, MD FASE [facility] Cardiologist [facility]  [address] [place], [address] [place], [place] [address] [phone]  1:13 PM

## 2023-06-03 ENCOUNTER — Encounter: Payer: Self-pay | Admitting: Internal Medicine

## 2023-06-03 ENCOUNTER — Encounter (INDEPENDENT_AMBULATORY_CARE_PROVIDER_SITE_OTHER): Payer: Self-pay

## 2023-06-03 ENCOUNTER — Ambulatory Visit (INDEPENDENT_AMBULATORY_CARE_PROVIDER_SITE_OTHER): Payer: Medicare HMO

## 2023-06-03 ENCOUNTER — Ambulatory Visit (INDEPENDENT_AMBULATORY_CARE_PROVIDER_SITE_OTHER): Payer: Self-pay | Admitting: Internal Medicine

## 2023-06-03 VITALS — BP 110/60 | HR 91 | Temp 98.5°F | Ht 64.0 in | Wt 253.2 lb

## 2023-06-03 VITALS — BP 110/60 | HR 91 | Temp 98.5°F | Ht 64.0 in | Wt 253.0 lb

## 2023-06-03 DIAGNOSIS — R0683 Snoring: Secondary | ICD-10-CM | POA: Diagnosis not present

## 2023-06-03 DIAGNOSIS — E785 Hyperlipidemia, unspecified: Secondary | ICD-10-CM

## 2023-06-03 DIAGNOSIS — Z0000 Encounter for general adult medical examination without abnormal findings: Secondary | ICD-10-CM | POA: Diagnosis not present

## 2023-06-03 DIAGNOSIS — Z6841 Body Mass Index (BMI) 40.0 and over, adult: Secondary | ICD-10-CM

## 2023-06-03 DIAGNOSIS — D649 Anemia, unspecified: Secondary | ICD-10-CM

## 2023-06-03 DIAGNOSIS — R55 Syncope and collapse: Secondary | ICD-10-CM | POA: Diagnosis not present

## 2023-06-03 DIAGNOSIS — R718 Other abnormality of red blood cells: Secondary | ICD-10-CM | POA: Diagnosis not present

## 2023-06-03 DIAGNOSIS — F419 Anxiety disorder, unspecified: Secondary | ICD-10-CM

## 2023-06-03 DIAGNOSIS — I491 Atrial premature depolarization: Secondary | ICD-10-CM | POA: Diagnosis not present

## 2023-06-03 DIAGNOSIS — E039 Hypothyroidism, unspecified: Secondary | ICD-10-CM | POA: Diagnosis not present

## 2023-06-03 DIAGNOSIS — E66813 Obesity, class 3: Secondary | ICD-10-CM

## 2023-06-03 NOTE — Patient Instructions (Addendum)
 Zepbound  Hypothyroidism  Hypothyroidism is when the thyroid  gland does not make enough of certain hormones. This is called an underactive thyroid . The thyroid  gland is a small gland located in the lower front part of the neck, just in front of the windpipe (trachea). This gland makes hormones that help control how the body uses food for energy (metabolism) as well as how the heart and brain function. These hormones also play a role in keeping your bones strong. When the thyroid  is underactive, it produces too little of the hormones thyroxine (T4) and triiodothyronine (T3). What are the causes? This condition may be caused by: Hashimoto's disease. This is a disease in which the body's disease-fighting system (immune system) attacks the thyroid  gland. This is the most common cause. Viral infections. Pregnancy. Certain medicines. Birth defects. Problems with a gland in the center of the brain (pituitary gland). Lack of enough iodine in the diet. Other causes may include: Past radiation treatments to the head or neck for cancer. Past treatment with radioactive iodine. Past exposure to radiation in the environment. Past surgical removal of part or all of the thyroid . What increases the risk? You are more likely to develop this condition if: You are female. You have a family history of thyroid  conditions. You use a medicine called lithium. You take medicines that affect the immune system (immunosuppressants). What are the signs or symptoms? Common symptoms of this condition include: Not being able to tolerate cold. Feeling as though you have no energy (lethargy). Lack of appetite. Constipation. Sadness or depression. Weight gain that is not explained by a change in diet or exercise habits. Menstrual irregularity. Dry skin, coarse hair, or brittle nails. Other symptoms may include: Muscle pain. Slowing of thought processes. Poor memory. How is this diagnosed? This condition may be  diagnosed based on: Your symptoms, your medical history, and a physical exam. Blood tests. You may also have imaging tests, such as an ultrasound or MRI. How is this treated? This condition is treated with medicine that replaces the thyroid  hormones that your body does not make. After you begin treatment, it may take several weeks for symptoms to go away. Follow these instructions at home: Take over-the-counter and prescription medicines only as told by your health care provider. If you start taking any new medicines, tell your health care provider. Keep all follow-up visits as told by your health care provider. This is important. As your condition improves, your dosage of thyroid  hormone medicine may change. You will need to have blood tests regularly so that your health care provider can monitor your condition. Contact a health care provider if: Your symptoms do not get better with treatment. You are taking thyroid  hormone replacement medicine and you: Sweat a lot. Have tremors. Feel anxious. Lose weight rapidly. Cannot tolerate heat. Have emotional swings. Have diarrhea. Feel weak. Get help right away if: You have chest pain. You have an irregular heartbeat. You have a rapid heartbeat. You have difficulty breathing. These symptoms may be an emergency. Get help right away. Call 911. Do not wait to see if the symptoms will go away. Do not drive yourself to the hospital. Summary Hypothyroidism is when the thyroid  gland does not make enough of certain hormones (it is underactive). When the thyroid  is underactive, it produces too little of the hormones thyroxine (T4) and triiodothyronine (T3). The most common cause is Hashimoto's disease, a disease in which the body's disease-fighting system (immune system) attacks the thyroid  gland. The condition can also  be caused by viral infections, medicine, pregnancy, or past radiation treatment to the head or neck. Symptoms may include weight  gain, dry skin, constipation, feeling as though you do not have energy, and not being able to tolerate cold. This condition is treated with medicine to replace the thyroid  hormones that your body does not make. This information is not intended to replace advice given to you by your health care provider. Make sure you discuss any questions you have with your health care provider. Document Revised: 01/29/2021 Document Reviewed: 01/29/2021 Elsevier Patient Education  2024 [employer].

## 2023-06-03 NOTE — Patient Instructions (Addendum)
 Natalie Guerrero , Thank you for taking time to come for your Medicare Wellness Visit. I appreciate your ongoing commitment to your health goals. Please review the following plan we discussed and let me know if I can assist you in the future.   Referrals/Orders/Follow-Ups/Clinician Recommendations: none  Managing Pain Without Opioids Opioids are strong medicines used to treat moderate to severe pain. For some people, especially those who have long-term (chronic) pain, opioids may not be the best choice for pain management due to: Side effects like nausea, constipation, and sleepiness. The risk of addiction (opioid use disorder). The longer you take opioids, the greater your risk of addiction. Pain that lasts for more than 3 months is called chronic pain. Managing chronic pain usually requires more than one approach and is often provided by a team of health care providers working together (multidisciplinary approach). Pain management may be done at a pain management center or pain clinic. How to manage pain without the use of opioids Use non-opioid medicines Non-opioid medicines for pain may include: Over-the-counter or prescription non-steroidal anti-inflammatory drugs (NSAIDs). These may be the first medicines used for pain. They work well for muscle and bone pain, and they reduce swelling. Acetaminophen . This over-the-counter medicine may work well for milder pain but not swelling. Antidepressants. These may be used to treat chronic pain. A certain type of antidepressant (tricyclics) is often used. These medicines are given in lower doses for pain than when used for depression. Anticonvulsants. These are usually used to treat seizures but may also reduce nerve (neuropathic) pain. Muscle relaxants. These relieve pain caused by sudden muscle tightening (spasms). You may also use a pain medicine that is applied to the skin as a patch, cream, or gel (topical analgesic), such as a numbing medicine. These  may cause fewer side effects than medicines taken by mouth. Do certain therapies as directed Some therapies can help with pain management. They include: Physical therapy. You will do exercises to gain strength and flexibility. A physical therapist may teach you exercises to move and stretch parts of your body that are weak, stiff, or painful. You can learn these exercises at physical therapy visits and practice them at home. Physical therapy may also involve: Massage. Heat wraps or applying heat or cold to affected areas. Electrical signals that interrupt pain signals (transcutaneous electrical nerve stimulation, TENS). Weak lasers that reduce pain and swelling (low-level laser therapy). Signals from your body that help you learn to regulate pain (biofeedback). Occupational therapy. This helps you to learn ways to function at home and work with less pain. Recreational therapy. This involves trying new activities or hobbies, such as a physical activity or drawing. Mental health therapy, including: Cognitive behavioral therapy (CBT). This helps you learn coping skills for dealing with pain. Acceptance and commitment therapy (ACT) to change the way you think and react to pain. Relaxation therapies, including muscle relaxation exercises and mindfulness-based stress reduction. Pain management counseling. This may be individual, family, or group counseling.  Receive medical treatments Medical treatments for pain management include: Nerve block injections. These may include a pain blocker and anti-inflammatory medicines. You may have injections: Near the spine to relieve chronic back or neck pain. Into joints to relieve back or joint pain. Into nerve areas that supply a painful area to relieve body pain. Into muscles (trigger point injections) to relieve some painful muscle conditions. A medical device placed near your spine to help block pain signals and relieve nerve pain or chronic back pain  (spinal  cord stimulation device). Acupuncture. Follow these instructions at home Medicines Take over-the-counter and prescription medicines only as told by your health care provider. If you are taking pain medicine, ask your health care providers about possible side effects to watch out for. Do not drive or use heavy machinery while taking prescription opioid pain medicine. Lifestyle  Do not use drugs or alcohol to reduce pain. If you drink alcohol, limit how much you have to: 0-1 drink a day for women who are not pregnant. 0-2 drinks a day for men. Know how much alcohol is in a drink. In the [place] one drink equals one 12 oz bottle of beer (355 mL), one 5 oz glass of wine (148 mL), or one 1 oz glass of hard liquor (44 mL). Do not use any products that contain nicotine or tobacco. These products include cigarettes, chewing tobacco, and vaping devices, such as e-cigarettes. If you need help quitting, ask your health care provider. Eat a healthy diet and maintain a healthy weight. Poor diet and excess weight may make pain worse. Eat foods that are high in fiber. These include fresh fruits and vegetables, whole grains, and beans. Limit foods that are high in fat and processed sugars, such as fried and sweet foods. Exercise regularly. Exercise lowers stress and may help relieve pain. Ask your health care provider what activities and exercises are safe for you. If your health care provider approves, join an exercise class that combines movement and stress reduction. Examples include yoga and tai chi. Get enough sleep. Lack of sleep may make pain worse. Lower stress as much as possible. Practice stress reduction techniques as told by your therapist. General instructions Work with all your pain management providers to find the treatments that work best for you. You are an important member of your pain management team. There are many things you can do to reduce pain on your own. Consider joining an  online or in-person support group for people who have chronic pain. Keep all follow-up visits. This is important. Where to find more information You can find more information about managing pain without opioids from: American Academy of Pain Medicine: painmed.org Institute for Chronic Pain: instituteforchronicpain.org American Chronic Pain Association: theacpa.org Contact a health care provider if: You have side effects from pain medicine. Your pain gets worse or does not get better with treatments or home therapy. You are struggling with anxiety or depression. Summary Many types of pain can be managed without opioids. Chronic pain may respond better to pain management without opioids. Pain is best managed when you and a team of health care providers work together. Pain management without opioids may include non-opioid medicines, medical treatments, physical therapy, mental health therapy, and lifestyle changes. Tell your health care providers if your pain gets worse or is not being managed well enough. This information is not intended to replace advice given to you by your health care provider. Make sure you discuss any questions you have with your health care provider. Document Revised: 05/09/2020 Document Reviewed: 05/09/2020 Elsevier Patient Education  2024 [employer].  This is a list of the screening recommended for you and due dates:  Health Maintenance  Topic Date Due   Pap with HPV screening  06/28/2023   COVID-19 Vaccine (4 - 2024-25 season) 06/19/2023*   Flu Shot  09/11/2023   Medicare Annual Wellness Visit  06/02/2024   Mammogram  07/01/2024   Colon Cancer Screening  08/22/2025   DTaP/Tdap/Td vaccine (2 - Td or Tdap) 11/30/2028  Hepatitis C Screening  Completed   HIV Screening  Completed   Zoster (Shingles) Vaccine  Completed   HPV Vaccine  Aged Out   Meningitis B Vaccine  Aged Out  *Topic was postponed. The date shown is not the original due date.    Advanced  directives: (Copy Requested) Please bring a copy of your health care power of attorney and living will to the office to be added to your chart at your convenience. You can mail to [facility] [address] [place], [place] [address] or email to [email or web address]  Next Medicare Annual Wellness Visit scheduled for next year: Yes  insert Preventive Care attachment Insert FALL PREVENTION attachment if needed

## 2023-06-03 NOTE — Progress Notes (Signed)
 I,Victoria T Basil Lim, CMA,acting as a Neurosurgeon for Smiley Dung, MD.,have documented all relevant documentation on the behalf of Smiley Dung, MD,as directed by  Smiley Dung, MD while in the presence of Smiley Dung, MD.  Subjective:  Patient ID: Natalie Guerrero , female    DOB: 19-Dec-1965 , 58 y.o.   MRN: [id number]  Chief Complaint  Patient presents with   Hypothyroidism    Patient presents today for thyroid  & cholesterol follow up. She reports compliance with medications. Denies headache, chest pain & sob. AWV completed with [facility] advisor: Letitia Ravens.    Hyperlipidemia    HPI Discussed the use of AI scribe software for clinical note transcription with the patient, who gave verbal consent to proceed.  History of Present Illness Natalie Kahla Korando "Burdette Carolin" is a 58 year old female who presents for a follow-up visit for vasovagal syncope.  She experienced a vasovagal episode on April 2nd, feeling dizzy and losing consciousness while cooking. She does not recall the exact sequence of events leading up to the episode. She was taken to the emergency room and treated with two bags of saline solution for possible dehydration. She has a history of similar episodes, previously attributed to not eating.  She has been under the care of a cardiologist who reviewed her tests and found no significant issues. Her blood pressure has been noted to drop significantly upon standing, with readings dropping to below 100/60 mmHg. This does not occur every time she stands, and she is unsure of any specific pattern. She has been trying to increase her salt intake by adding sea salt to her meals and using Gatorade Light and other similar drinks. She has noticed a weight gain of ten pounds and is concerned about the use of artificial sweeteners in her diet.  She is currently taking several medications including Wellbutrin  300 mg, gabapentin  300 mg three times a day, levothyroxine  100 mcg Monday  through Friday and half on weekends, Zyrtec, montelukast  as needed, Restasis as needed, Zanaflex daily, and topiramate twice a day. She uses ibuprofen  and imipramine as needed.  She reports occasional vaginal bleeding after urination and rectal bleeding when constipated, which occurs weekly. She has hemorrhoids that are managed with Preparation H wipes. She was found to be anemic during her last ER visit, with low calcium and albumin levels. She does not consume alcohol and has been using Zipfizz tablets for B12 supplementation. She is not currently taking iron  supplements but has had iron  infusions in the past.  She experiences sleep disturbances, waking up three times a night and sometimes with headaches. Her husband reports that she snores.    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   GERD (gastroesophageal reflux disease) 2019   Glaucoma 2023   Heart murmur 09-2019   Hyperlipidemia LDL goal <100 09/15/2022   Hypothyroidism 12/08/2012   Iron  deficiency anemia, unspecified 11/10/2012   Iron  malabsorption 01/20/2017   Neuromuscular disorder (HCC) 2012   Since back surgery   Obesity    Thyroid  disease hypothroidism     Family History  Problem Relation Age of Onset   Heart failure Mother    Anxiety disorder Mother    Arthritis Mother    Heart disease  Mother    Hypertension Mother    Stroke Mother    Varicose Veins Mother    Depression Mother    Prostate cancer Father    Alcohol abuse Father    Arthritis Father    Cancer Father    COPD Father    Depression Father    Hypertension Father    Obesity Father    Alcohol abuse Brother    Anxiety disorder Brother    Arthritis Brother    Drug abuse Brother    Hypertension Brother    Alcohol abuse  Brother    Anxiety disorder Brother    Drug abuse Brother    Early death Brother    Heart disease Brother    Hypertension Brother    Stroke Brother    Anxiety disorder Sister    Obesity Sister    Hypertension Sister    Obesity Brother    Depression Brother    Colon cancer Neg Hx    Diabetes Neg Hx    Kidney disease Neg Hx    Liver disease Neg Hx    Neuropathy Neg Hx      Current Outpatient Medications:    b complex vitamins capsule, Take 1 capsule by mouth daily., Disp: , Rfl:    benzonatate  (TESSALON  PERLES) 100 MG capsule, Take 1 capsule (100 mg total) by mouth 3 (three) times daily as needed for cough., Disp: 30 capsule, Rfl: 0   brimonidine (ALPHAGAN) 0.2 % ophthalmic solution, Place 1 drop into the right eye 2 (two) times daily., Disp: , Rfl:    buPROPion  (WELLBUTRIN  XL) 300 MG 24 hr tablet, TAKE 1 TABLET (300 MG TOTAL) BY MOUTH EVERY MORNING., Disp: 90 tablet, Rfl: 1   cetirizine (ZYRTEC) 10 MG tablet, Take 10 mg by mouth daily., Disp: , Rfl:    Cholecalciferol (VITAMIN D3) 50 MCG (2000 UT) capsule, Take 2,000 Units by mouth daily., Disp: , Rfl:    Collagen Hydrolysate POWD, by Does not apply route., Disp: , Rfl:    diclofenac Sodium (VOLTAREN) 1 % GEL, as needed (pain)., Disp: , Rfl:    docusate sodium (COLACE) 100 MG capsule, Take 200 mg by mouth as needed for moderate constipation., Disp: , Rfl:    gabapentin  (NEURONTIN ) 300 MG capsule, Take 300 mg by mouth 3 (three) times daily., Disp: , Rfl:    guaiFENesin (MUCINEX) 600 MG 12 hr tablet, Take by mouth 2 (two) times daily., Disp: , Rfl:    ibuprofen  (ADVIL ) 800 MG tablet, Take 800 mg by mouth every 8 (eight) hours as needed., Disp: , Rfl:    imipramine (TOFRANIL) 50 MG tablet, 50 mg 2 (two) times daily., Disp: , Rfl:    levothyroxine  (SYNTHROID ) 100 MCG tablet, TAKE 1 TABLET MON-FRI AND TAKE 1/2 PILL ON SATURDAY/SUNDAY., Disp: 78 tablet, Rfl: 1   Magnesium  200 MG TABS, Take 4 tablets by mouth daily., Disp: , Rfl:     montelukast  (SINGULAIR ) 10 MG tablet, TAKE 1 TABLET BY MOUTH EVERY DAY, Disp: 90 tablet, Rfl: 1   naloxone (NARCAN) nasal spray 4 mg/0.1 mL, See admin instructions., Disp: , Rfl:    NOREL AD 4-10-325 MG TABS, TAKE 1 TABLET BY MOUTH TWICE A DAY AS NEEDED, Disp: 20 tablet, Rfl: 0   omeprazole  (PRILOSEC) 40 MG capsule, TAKE 1 CAPSULE BY MOUTH EVERY DAY, Disp: 90 capsule, Rfl: 1   OVER THE COUNTER MEDICATION, Tea made with ginger root and tumeric root., Disp: , Rfl:    Oxycodone  HCl 20 MG  TABS, SMARTSIG:0.5-1 Tablet(s) By Mouth 4-6 Times Daily, Disp: , Rfl:    RESTASIS 0.05 % ophthalmic emulsion, Place 1 drop into both eyes as needed. Dry eyes, Disp: , Rfl:    tiZANidine (ZANAFLEX) 4 MG tablet, every 4 (four) hours as needed., Disp: , Rfl:    topiramate (TOPAMAX) 100 MG tablet, Take 100 mg by mouth 4 (four) times daily., Disp: , Rfl:    Zinc Citrate (ZINC GUMMY PO), Take by mouth daily., Disp: , Rfl:   Current Facility-Administered Medications:    triamcinolone  acetonide (KENALOG ) 10 MG/ML injection 20 mg, 20 mg, Other, Once, Regal, Angus Kenning, DPM   Allergies  Allergen Reactions   Codeine Itching     Review of Systems  Constitutional: Negative.   Respiratory: Negative.    Cardiovascular: Negative.   Gastrointestinal: Negative.   Neurological: Negative.   Psychiatric/Behavioral: Negative.       Today's Vitals   06/03/23 1059  BP: 110/60  Pulse: 91  Temp: 98.5 F (36.9 C)  SpO2: 98%  Weight: 253 lb (114.8 kg)  Height: 5\' 4"  (1.626 m)   Body mass index is 43.43 kg/m.  Wt Readings from Last 3 Encounters:  06/03/23 253 lb (114.8 kg)  06/03/23 253 lb 3.2 oz (114.9 kg)  06/01/23 252 lb (114.3 kg)     Objective:  Physical Exam Vitals and nursing note reviewed.  Constitutional:      Appearance: Normal appearance. She is obese.  HENT:     Head: Normocephalic and atraumatic.  Eyes:     Extraocular Movements: Extraocular movements intact.  Cardiovascular:     Rate and Rhythm:  Normal rate and regular rhythm.     Heart sounds: Normal heart sounds.  Pulmonary:     Effort: Pulmonary effort is normal.     Breath sounds: Normal breath sounds.  Musculoskeletal:     Cervical back: Normal range of motion.  Skin:    General: Skin is warm.  Neurological:     General: No focal deficit present.     Mental Status: She is alert.  Psychiatric:        Mood and Affect: Mood normal.        Behavior: Behavior normal.         Assessment And Plan:  Vasovagal syncope Assessment & Plan: Most recent ER visit notes dated 4//25 were reviewed in detail.  As discussed at previous visit, she is encouraged to eat/drink in regular intervals.  Recent episode likely due to dehydration and lack of food intake. - Monitor for recurrence. - Ensure adequate hydration and nutrition.   Primary hypothyroidism Assessment & Plan: Chronic, currently on Synthroid  100mcg daily M-F and 1/2 tab on Saturday/Sundays. I will check thyroid  panel and adjust meds as needed.   Orders: -     TSH + free T4  Snoring Assessment & Plan: Symptoms suggestive of sleep apnea, home sleep test needed. - Arrange home sleep test through [facility]. - She endorses snoring, daytime fatigue and non-restorative sleep.    Anxiety Assessment & Plan: Chronic, symptoms improved with Wellbutrin  XL 300mg  daily. Encouraged to take meds as prescribed.    PAC (premature atrial contraction) -     BMP8+EGFR  Elevated MCV -     B12 and Folate Panel  Anemia, unspecified type -     Iron , TIBC and Ferritin Panel  Class 3 severe obesity due to excess calories without serious comorbidity with body mass index (BMI) of 40.0 to 44.9 in adult Assessment &  Plan: BMI 43. BMI 41. She is encouraged to aim for at least 150 minutes of exercise per week.  Interested in weight loss, insurance coverage contingent on sleep apnea or heart disease diagnosis. - Check insurance coverage for weight loss medication. - Refer to  weight loss clinic for metabolic evaluation and meal planning. - Encourage protein intake and strength training.  Orders: -     Amb Ref to Medical Weight Management    Return if symptoms worsen or fail to improve.  Patient was given opportunity to ask questions. Patient verbalized understanding of the plan and was able to repeat key elements of the plan. All questions were answered to their satisfaction.   I, Smiley Dung, MD, have reviewed all documentation for this visit. The documentation on 06/03/23 for the exam, diagnosis, procedures, and orders are all accurate and complete.   IF YOU HAVE BEEN REFERRED TO A SPECIALIST, IT MAY TAKE 1-2 WEEKS TO SCHEDULE/PROCESS THE REFERRAL. IF YOU HAVE NOT HEARD FROM US /SPECIALIST IN TWO WEEKS, PLEASE GIVE US  A CALL AT [phone].   THE PATIENT IS ENCOURAGED TO PRACTICE SOCIAL DISTANCING DUE TO THE COVID-19 PANDEMIC.

## 2023-06-03 NOTE — Progress Notes (Signed)
 Subjective:   Elmira Olkowski is a 58 y.o. who presents for a Medicare Wellness preventive visit.  Visit Complete: In person    Persons Participating in Visit: Patient.  AWV Questionnaire: Yes: Patient Medicare AWV questionnaire was completed by the patient on 06/02/2023; I have confirmed that all information answered by patient is correct and no changes since this date.  Cardiac Risk Factors include: obesity (BMI >30kg/m2)     Objective:    Today's Vitals   06/03/23 1045 06/03/23 1046  BP: 110/60   Pulse: 91   Temp: 98.5 F (36.9 C)   TempSrc: Oral   SpO2: 99%   Weight: 253 lb 3.2 oz (114.9 kg)   Height: 5\' 4"  (1.626 m)   PainSc:  4    Body mass index is 43.46 kg/m.     06/03/2023   10:54 AM 05/13/2023    7:06 PM 08/07/2022   10:48 AM 07/03/2022   10:38 AM 05/21/2022   11:44 AM 04/03/2022   11:09 AM 12/24/2021    3:02 PM  Advanced Directives  Does Patient Have a Medical Advance Directive? Yes Yes Yes Yes Yes No;Yes No  Type of Estate agent of [place];Living will Living will;Healthcare Power of Attorney Living will;Healthcare Power of Attorney Living will;Healthcare Power of [employer] Power of [place];Living will Living will;Healthcare Power of Attorney   Does patient want to make changes to medical advance directive?    No - Patient declined  No - Patient declined   Copy of Healthcare Power of Attorney in Chart? No - copy requested  No - copy requested No - copy requested No - copy requested No - copy requested   Would patient like information on creating a medical advance directive?   No - Patient declined No - Patient declined  No - Patient declined No - Patient declined    Current Medications (verified) Outpatient Encounter Medications as of 06/03/2023  Medication Sig   b complex vitamins capsule Take 1 capsule by mouth daily.   benzonatate  (TESSALON  PERLES) 100 MG capsule Take 1 capsule (100 mg total) by mouth 3 (three)  times daily as needed for cough.   brimonidine (ALPHAGAN) 0.2 % ophthalmic solution Place 1 drop into the right eye 2 (two) times daily.   buPROPion  (WELLBUTRIN  XL) 300 MG 24 hr tablet TAKE 1 TABLET (300 MG TOTAL) BY MOUTH EVERY MORNING.   cetirizine (ZYRTEC) 10 MG tablet Take 10 mg by mouth daily.   Cholecalciferol (VITAMIN D3) 50 MCG (2000 UT) capsule Take 2,000 Units by mouth daily.   Collagen Hydrolysate POWD by Does not apply route.   diclofenac Sodium (VOLTAREN) 1 % GEL as needed (pain).   docusate sodium (COLACE) 100 MG capsule Take 200 mg by mouth as needed for moderate constipation.   gabapentin  (NEURONTIN ) 300 MG capsule Take 300 mg by mouth 3 (three) times daily.   guaiFENesin (MUCINEX) 600 MG 12 hr tablet Take by mouth 2 (two) times daily.   ibuprofen  (ADVIL ) 800 MG tablet Take 800 mg by mouth every 8 (eight) hours as needed.   imipramine (TOFRANIL) 50 MG tablet 50 mg 2 (two) times daily.   levothyroxine  (SYNTHROID ) 100 MCG tablet TAKE 1 TABLET MON-FRI AND TAKE 1/2 PILL ON SATURDAY/SUNDAY.   Magnesium  200 MG TABS Take 4 tablets by mouth daily.   montelukast  (SINGULAIR ) 10 MG tablet TAKE 1 TABLET BY MOUTH EVERY DAY   naloxone (NARCAN) nasal spray 4 mg/0.1 mL See admin instructions.   NOREL  AD 4-10-325 MG TABS TAKE 1 TABLET BY MOUTH TWICE A DAY AS NEEDED   omeprazole  (PRILOSEC) 40 MG capsule TAKE 1 CAPSULE BY MOUTH EVERY DAY   OVER THE COUNTER MEDICATION Tea made with ginger root and tumeric root.   Oxycodone  HCl 20 MG TABS SMARTSIG:0.5-1 Tablet(s) By Mouth 4-6 Times Daily   RESTASIS 0.05 % ophthalmic emulsion Place 1 drop into both eyes as needed. Dry eyes   tiZANidine (ZANAFLEX) 4 MG tablet every 4 (four) hours as needed.   topiramate (TOPAMAX) 100 MG tablet Take 100 mg by mouth 4 (four) times daily.   Zinc Citrate (ZINC GUMMY PO) Take by mouth daily.   Facility-Administered Encounter Medications as of 06/03/2023  Medication   triamcinolone  acetonide (KENALOG ) 10 MG/ML injection  20 mg    Allergies (verified) Codeine   History: 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   GERD (gastroesophageal reflux disease) 2019   Glaucoma 2023   Heart murmur 09-2019   Hyperlipidemia LDL goal <100 09/15/2022   Hypothyroidism 12/08/2012   Iron  deficiency anemia, unspecified 11/10/2012   Iron  malabsorption 01/20/2017   Neuromuscular disorder (HCC) 2012   Since back surgery   Obesity    Thyroid  disease hypothroidism   Past Surgical History:  Procedure Laterality Date   ABDOMINAL HYSTERECTOMY  02/10/1997   BACK SURGERY     CESAREAN SECTION  02/10/1989   CHOLECYSTECTOMY N/A 12/22/2017   Procedure: LAPAROSCOPIC CHOLECYSTECTOMY;  Surgeon: Shela Derby, MD;  Location: [facility] OR;  Service: General;  Laterality: N/A;   COLON SURGERY  2000   Gastric bypass   GASTRIC BYPASS  02/10/2005   JOINT REPLACEMENT  2007   No replacement knee surgery   KNEE SURGERY Left 02/10/1998   SMALL INTESTINE SURGERY  2000   Gastric bypass   SPINE SURGERY  2012   Family History  Problem Relation Age of Onset   Heart failure Mother    Anxiety disorder Mother    Arthritis Mother    Heart disease Mother    Hypertension Mother    Stroke Mother    Varicose Veins Mother    Depression Mother    Prostate cancer Father    Alcohol abuse Father    Arthritis Father    Cancer Father    COPD Father    Depression Father    Hypertension Father    Obesity Father    Alcohol abuse Brother    Anxiety disorder Brother    Arthritis Brother    Drug abuse Brother    Hypertension Brother    Alcohol abuse Brother    Anxiety disorder Brother    Drug abuse Brother    Early death Brother    Heart disease Brother    Hypertension Brother    Stroke Brother    Anxiety  disorder Sister    Obesity Sister    Hypertension Sister    Obesity Brother    Depression Brother    Colon cancer Neg Hx    Diabetes Neg Hx    Kidney disease Neg Hx    Liver disease Neg Hx    Neuropathy Neg Hx    Social History   Socioeconomic History   Marital status: Married    Spouse name: Not on  file   Number of children: Not on file   Years of education: Not on file   Highest education level: Some college, no degree  Occupational History   Occupation: disability  Tobacco Use   Smoking status: Never   Smokeless tobacco: Never   Tobacco comments:    NA  Vaping Use   Vaping status: Never Used  Substance and Sexual Activity   Alcohol use: Not Currently   Drug use: Yes    Types: Oxycodone     Comment: Medications under Dr care   Sexual activity: Yes    Birth control/protection: Post-menopausal    Comment: I don't have a healthy sex life due to back surgery.  Other Topics Concern   Not on file  Social History Narrative   Not on file   Social Drivers of Health   Financial Resource Strain: Low Risk  (06/03/2023)   Overall Financial Resource Strain (CARDIA)    Difficulty of Paying Living Expenses: Not hard at all  Food Insecurity: No Food Insecurity (06/03/2023)   Hunger Vital Sign    Worried About Running Out of Food in the Last Year: Never true    Ran Out of Food in the Last Year: Never true  Transportation Needs: No Transportation Needs (06/03/2023)   PRAPARE - Administrator, Civil Service (Medical): No    Lack of Transportation (Non-Medical): No  Physical Activity: Inactive (06/03/2023)   Exercise Vital Sign    Days of Exercise per Week: 0 days    Minutes of Exercise per Session: 0 min  Stress: No Stress Concern Present (06/03/2023)   [employer] of Occupational Health - Occupational Stress Questionnaire    Feeling of Stress : Not at all  Social Connections: Socially Integrated (06/03/2023)   Social Connection and Isolation Panel [NHANES]     Frequency of Communication with Friends and Family: More than three times a week    Frequency of Social Gatherings with Friends and Family: Once a week    Attends Religious Services: More than 4 times per year    Active Member of [employer] or Organizations: Yes    Attends Engineer, structural: More than 4 times per year    Marital Status: Married    Tobacco Counseling Counseling given: Not Answered Tobacco comments: NA    Clinical Intake:  Pre-visit preparation completed: Yes  Pain : 0-10 Pain Score: 4  Pain Type: Chronic pain Pain Location: Back Pain Orientation: Lower Pain Radiating Towards: down legs Pain Descriptors / Indicators: Aching Pain Onset: More than a month ago Pain Frequency: Constant     Nutritional Status: BMI > 30  Obese Nutritional Risks: None Diabetes: No  Lab Results  Component Value Date   HGBA1C 5.2 09/10/2022   HGBA1C 5.6 03/28/2021   HGBA1C 5.3 07/04/2019        Interpreter Needed?: No  Information entered by :: NAllen LPN   Activities of Daily Living     06/02/2023    8:09 PM  In your present state of health, do you have any difficulty performing the following activities:  Hearing? 0  Vision? 0  Difficulty concentrating or making decisions? 0  Walking or climbing stairs? 1  Comment due to back and knees  Dressing or bathing? 1  Comment takes time  Doing errands, shopping? 1  Comment someone is usually with her  Preparing Food and eating ? Y  Comment works with husband  Using the Toilet? N  In the past six months,  have you accidently leaked urine? Y  Do you have problems with loss of bowel control? N  Managing your Medications? N  Managing your Finances? N  Housekeeping or managing your Housekeeping? Y    Patient Care Team: Cleave Curling, MD as PCP - General (Internal Medicine) Dalton-Bethea, Clarisa Crooked, MD as Consulting Physician (Physical Medicine and Rehabilitation) Toula French, MD as Consulting Physician (Pain  Medicine)  Indicate any recent Medical Services you may have received from other than [facility] providers in the past year (date may be approximate).     Assessment:   This is a routine wellness examination for [place]  Hearing/Vision screen Hearing Screening - Comments:: Denies hearing issues Vision Screening - Comments:: Regular eye exams, Dr. Gwendalyn Lemma   Goals Addressed             This Visit's Progress    Patient Stated       06/03/2023, wants to ride her stationary bike       Depression Screen     06/03/2023   10:56 AM 08/27/2022   11:01 AM 05/21/2022   11:53 AM 05/21/2022   11:46 AM 01/21/2022    9:48 AM 07/22/2021    8:41 AM 05/01/2021    9:20 AM  PHQ 2/9 Scores  PHQ - 2 Score 1 0 0 0 0 0 0  PHQ- 9 Score 7 0 0  0 0     Fall Risk     06/02/2023    8:09 PM 01/01/2023    3:15 PM 05/21/2022    7:57 AM 05/01/2021    9:19 AM 04/12/2020    2:12 PM  Fall Risk   Falls in the past year? 1 1 1 1 1   Comment black out  blacked out got up too fast tripped, lost balance  Number falls in past yr: 1 0 1 1 1   Injury with Fall? 0 0 0 0 0  Risk for fall due to : Impaired mobility;Impaired balance/gait;Medication side effect;History of fall(s) History of fall(s) Medication side effect;History of fall(s);Impaired balance/gait Medication side effect Impaired balance/gait;Medication side effect  Follow up Falls prevention discussed;Falls evaluation completed Falls evaluation completed Falls prevention discussed;Education provided;Falls evaluation completed Falls evaluation completed;Education provided;Falls prevention discussed Falls evaluation completed;Education provided;Falls prevention discussed    MEDICARE RISK AT HOME:  Medicare Risk at Home Any stairs in or around the home?: (Patient-Rptd) Yes If so, are there any without handrails?: (Patient-Rptd) Yes Home free of loose throw rugs in walkways, pet beds, electrical cords, etc?: (Patient-Rptd) Yes Adequate lighting in your home to  reduce risk of falls?: (Patient-Rptd) Yes Life alert?: (Patient-Rptd) No Use of a cane, walker or w/c?: (Patient-Rptd) Yes Grab bars in the bathroom?: (Patient-Rptd) No Shower chair or bench in shower?: (Patient-Rptd) Yes Elevated toilet seat or a handicapped toilet?: (Patient-Rptd) Yes  TIMED UP AND GO:  Was the test performed?  Yes  Length of time to ambulate 10 feet: 7 sec Gait slow and steady with assistive device  Cognitive Function: 6CIT completed        06/03/2023   10:59 AM 05/21/2022   11:47 AM 05/01/2021    9:22 AM 04/12/2020    2:14 PM 12/28/2019   12:37 PM  6CIT Screen  What Year? 0 points 0 points 0 points 0 points 0 points  What month? 0 points 0 points 0 points 0 points 0 points  What time? 0 points 0 points 0 points 0 points 0 points  Count back from 20 0 points 0  points 0 points 0 points 0 points  Months in reverse 0 points 0 points 0 points 0 points 0 points  Repeat phrase 4 points 0 points 2 points 2 points 0 points  Total Score 4 points 0 points 2 points 2 points 0 points    Immunizations Immunization History  Administered Date(s) Administered   Influenza,inj,Quad PF,6+ Mos 12/31/2017, 11/08/2018, 12/28/2019, 04/11/2021, 02/25/2022   PFIZER(Purple Top)SARS-COV-2 Vaccination 05/05/2019, 05/30/2019, 02/10/2020   Tdap 12/01/2018   Zoster Recombinant(Shingrix ) 10/29/2021, 01/21/2022    Screening Tests Health Maintenance  Topic Date Due   COVID-19 Vaccine (4 - 2024-25 season) 10/12/2022   Cervical Cancer Screening (HPV/Pap Cotest)  06/28/2023   INFLUENZA VACCINE  09/11/2023   Medicare Annual Wellness (AWV)  06/02/2024   MAMMOGRAM  07/01/2024   Colonoscopy  08/22/2025   DTaP/Tdap/Td (2 - Td or Tdap) 11/30/2028   Hepatitis C Screening  Completed   HIV Screening  Completed   Zoster Vaccines- Shingrix   Completed   HPV VACCINES  Aged Out   Meningococcal B Vaccine  Aged Out    Health Maintenance  Health Maintenance Due  Topic Date Due   COVID-19  Vaccine (4 - 2024-25 season) 10/12/2022   Cervical Cancer Screening (HPV/Pap Cotest)  06/28/2023   Health Maintenance Items Addressed: Up to date  Additional Screening:  Vision Screening: Recommended annual ophthalmology exams for early detection of glaucoma and other disorders of the eye.  Dental Screening: Recommended annual dental exams for proper oral hygiene  Community Resource Referral / Chronic Care Management: CRR required this visit?  No   CCM required this visit?  No     Plan:     I have personally reviewed and noted the following in the patient's chart:   Medical and social history Use of alcohol, tobacco or illicit drugs  Current medications and supplements including opioid prescriptions. Patient is currently taking opioid prescriptions. Information provided to patient regarding non-opioid alternatives. Patient advised to discuss non-opioid treatment plan with their provider. Functional ability and status Nutritional status Physical activity Advanced directives List of other physicians Hospitalizations, surgeries, and ER visits in previous 12 months Vitals Screenings to include cognitive, depression, and falls Referrals and appointments  In addition, I have reviewed and discussed with patient certain preventive protocols, quality metrics, and best practice recommendations. A written personalized care plan for preventive services as well as general preventive health recommendations were provided to patient.     Areatha Beecham, LPN   [id number]   After Visit Summary: (In Person-Printed) AVS printed and given to the patient  Notes: Nothing significant to report at this time.

## 2023-06-04 DIAGNOSIS — M533 Sacrococcygeal disorders, not elsewhere classified: Secondary | ICD-10-CM | POA: Diagnosis not present

## 2023-06-04 LAB — BMP8+EGFR
BUN/Creatinine Ratio: 17 (ref 9–23)
BUN: 15 mg/dL (ref 6–24)
CO2: 22 mmol/L (ref 20–29)
Calcium: 9 mg/dL (ref 8.7–10.2)
Chloride: 103 mmol/L (ref 96–106)
Creatinine, Ser: 0.86 mg/dL (ref 0.57–1.00)
Glucose: 83 mg/dL (ref 70–99)
Potassium: 4.9 mmol/L (ref 3.5–5.2)
Sodium: 139 mmol/L (ref 134–144)
eGFR: 78 mL/min/{1.73_m2} (ref >59)

## 2023-06-04 LAB — B12 AND FOLATE PANEL
Folate: >20 ng/mL (ref >3.0)
Vitamin B-12: >2000 pg/mL — ABNORMAL HIGH (ref 232–1245)

## 2023-06-04 LAB — IRON,TIBC AND FERRITIN PANEL
Ferritin: 26 ng/mL (ref 15–150)
Iron Saturation: 34 % (ref 15–55)
Iron: 96 ug/dL (ref 27–159)
Total Iron Binding Capacity: 286 ug/dL (ref 250–450)
UIBC: 190 ug/dL (ref 131–425)

## 2023-06-04 LAB — TSH+FREE T4
Free T4: 1.1 ng/dL (ref 0.82–1.77)
TSH: 1.2 u[IU]/mL (ref 0.450–4.500)

## 2023-06-05 ENCOUNTER — Encounter: Payer: Self-pay | Admitting: Internal Medicine

## 2023-06-08 ENCOUNTER — Encounter: Payer: Self-pay | Admitting: Internal Medicine

## 2023-06-14 DIAGNOSIS — R0683 Snoring: Secondary | ICD-10-CM | POA: Insufficient documentation

## 2023-06-14 DIAGNOSIS — R718 Other abnormality of red blood cells: Secondary | ICD-10-CM | POA: Insufficient documentation

## 2023-06-14 NOTE — Assessment & Plan Note (Addendum)
 BMI 43. BMI 41. She is encouraged to aim for at least 150 minutes of exercise per week.  Interested in weight loss, insurance coverage contingent on sleep apnea or heart disease diagnosis. - Check insurance coverage for weight loss medication. - Refer to weight loss clinic for metabolic evaluation and meal planning. - Encourage protein intake and strength training.

## 2023-06-14 NOTE — Assessment & Plan Note (Signed)
 Most recent ER visit notes dated 4//25 were reviewed in detail.  As discussed at previous visit, she is encouraged to eat/drink in regular intervals.  Recent episode likely due to dehydration and lack of food intake. - Monitor for recurrence. - Ensure adequate hydration and nutrition.

## 2023-06-14 NOTE — Assessment & Plan Note (Signed)
 Chronic, symptoms improved with Wellbutrin XL 300mg  daily. Encouraged to take meds as prescribed.

## 2023-06-14 NOTE — Assessment & Plan Note (Signed)
 Symptoms suggestive of sleep apnea, home sleep test needed. - Arrange home sleep test through [facility]. - She endorses snoring, daytime fatigue and non-restorative sleep.

## 2023-06-14 NOTE — Assessment & Plan Note (Signed)
Chronic, currently on Synthroid daily M-F and 1/2 tab on Saturday/Sundays. I will check thyroid panel and adjust meds as needed.

## 2023-06-14 NOTE — Assessment & Plan Note (Signed)
 Chronic, not yet on statin therapy.

## 2023-06-15 DIAGNOSIS — F1910 Other psychoactive substance abuse, uncomplicated: Secondary | ICD-10-CM | POA: Diagnosis not present

## 2023-06-15 DIAGNOSIS — Z79891 Long term (current) use of opiate analgesic: Secondary | ICD-10-CM | POA: Diagnosis not present

## 2023-06-15 DIAGNOSIS — Z0289 Encounter for other administrative examinations: Secondary | ICD-10-CM

## 2023-06-15 DIAGNOSIS — G894 Chronic pain syndrome: Secondary | ICD-10-CM | POA: Diagnosis not present

## 2023-06-15 DIAGNOSIS — M5430 Sciatica, unspecified side: Secondary | ICD-10-CM | POA: Diagnosis not present

## 2023-06-15 DIAGNOSIS — R202 Paresthesia of skin: Secondary | ICD-10-CM | POA: Diagnosis not present

## 2023-06-15 DIAGNOSIS — M62830 Muscle spasm of back: Secondary | ICD-10-CM | POA: Diagnosis not present

## 2023-06-15 DIAGNOSIS — M179 Osteoarthritis of knee, unspecified: Secondary | ICD-10-CM | POA: Diagnosis not present

## 2023-06-16 ENCOUNTER — Ambulatory Visit (INDEPENDENT_AMBULATORY_CARE_PROVIDER_SITE_OTHER): Admitting: Family Medicine

## 2023-06-16 ENCOUNTER — Encounter: Payer: Self-pay | Admitting: Family Medicine

## 2023-06-16 VITALS — BP 114/77 | HR 82 | Temp 98.1°F | Ht 65.5 in | Wt 257.0 lb

## 2023-06-16 DIAGNOSIS — G894 Chronic pain syndrome: Secondary | ICD-10-CM

## 2023-06-16 DIAGNOSIS — Z6841 Body Mass Index (BMI) 40.0 and over, adult: Secondary | ICD-10-CM | POA: Diagnosis not present

## 2023-06-16 DIAGNOSIS — R635 Abnormal weight gain: Secondary | ICD-10-CM

## 2023-06-16 DIAGNOSIS — Z9884 Bariatric surgery status: Secondary | ICD-10-CM

## 2023-06-16 DIAGNOSIS — E66813 Obesity, class 3: Secondary | ICD-10-CM

## 2023-06-16 NOTE — Progress Notes (Signed)
 Office: [phone]  /  Fax: [phone]   Initial Visit  Natalie Guerrero was seen in clinic today to evaluate for obesity. She is interested in losing weight to improve overall health and reduce the risk of weight related complications. She presents today to review program treatment options, initial physical assessment, and evaluation.     She was referred by: PCP  When asked what else they would like to accomplish? She states: Improve existing medical conditions, Improve quality of life, and Improve appearance  Weight history:  weight rose after 2012 with back surgery; with continued back pain 319--> 200 lb with RYGB surgery 2007 with [facility] Has chronic nerve with limited exercise; eating a lot of fruit and craving more sugar  When asked how has your weight affected you? She states: Contributed to orthopedic problems or mobility issues and Having fatigue  Some associated conditions: None  Contributing factors: Family history of obesity, Moderate to high levels of stress, Reduced physical activity, Eating patterns, and Menopause  Weight promoting medications identified: Other: gabapentin   Current nutrition plan: None not eating much meat (other than pork)  Current level of physical activity: Limited due to chronic pain or orthopedic problems  Current or previous pharmacotherapy: Phentermine  and Topiramate  Response to medication: Ineffective so it was discontinued   Past medical history includes:   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   GERD (gastroesophageal reflux disease) 2019   Glaucoma 2023   Heart murmur 09-2019   Hyperlipidemia LDL goal <100 09/15/2022   Hypothyroidism 12/08/2012   Iron  deficiency  anemia, unspecified 11/10/2012   Iron  malabsorption 01/20/2017   Neuromuscular disorder (HCC) 2012   Since back surgery   Obesity    Thyroid  disease hypothroidism     Objective:   BP 114/77   Pulse 82   Temp 98.1 F (36.7 C)   Ht 5' 5.5" (1.664 m)   Wt 257 lb (116.6 kg)   SpO2 99%   BMI 42.12 kg/m  She was weighed on the bioimpedance scale: Body mass index is 42.12 kg/m.  Peak Weight:257 , Body Fat%:55.5, Visceral Fat Rating:18, Weight trend over the last 12 months: Increasing  General:  Alert, oriented and cooperative. Patient is in no acute distress.  Respiratory: Normal respiratory effort, no problems with respiration noted   Gait: able to ambulate independently  Mental Status: Normal mood and affect. Normal behavior. Normal judgment and thought content.   DIAGNOSTIC DATA REVIEWED:  BMET    Component Value Date/Time   NA 139 06/03/2023 1144   NA 137 01/16/2017 1502   NA 141 11/10/2012 1050   K 4.9 06/03/2023 1144   K 3.7 01/16/2017 1502   K 3.8 11/10/2012 1050   CL 103 06/03/2023 1144   CL 98 01/16/2017 1502   CO2 22 06/03/2023 1144   CO2 25 01/16/2017 1502   CO2 28 11/10/2012 1050   GLUCOSE 83 06/03/2023 1144   GLUCOSE 82 05/13/2023 1927   GLUCOSE 91 01/16/2017 1502   BUN 15 06/03/2023 1144   BUN 14 01/16/2017 1502   BUN 7.5 11/10/2012 1050   CREATININE 0.86 06/03/2023 1144   CREATININE 0.88 08/07/2022 1020   CREATININE 1.1 01/16/2017 1502   CREATININE 0.8 11/10/2012 1050  CALCIUM 9.0 06/03/2023 1144   CALCIUM 8.8 01/16/2017 1502   CALCIUM 9.0 11/10/2012 1050   GFRNONAA >60 05/13/2023 1927   GFRNONAA >60 08/07/2022 1020   GFRAA 78 12/28/2019 1418   Lab Results  Component Value Date   HGBA1C 5.2 09/10/2022   HGBA1C 4.9 02/25/2017   Lab Results  Component Value Date   INSULIN  2.8 07/22/2021   INSULIN  5.8 01/08/2021   CBC    Component Value Date/Time   WBC 5.9 05/13/2023 1927   RBC 2.99 (L) 05/13/2023 1927   HGB 9.7 (L) 05/13/2023 1927    HGB 11.1 01/01/2023 1629   HGB 8.1 (L) 01/16/2017 1502   HGB 10.0 (L) 12/08/2012 1513   HCT 31.4 (L) 05/13/2023 1927   HCT 34.6 01/01/2023 1629   HCT 26.2 (L) 01/16/2017 1502   HCT 30.6 (L) 12/08/2012 1513   PLT 278 05/13/2023 1927   PLT 368 01/01/2023 1629   MCV 105.0 (H) 05/13/2023 1927   MCV 101 (H) 01/01/2023 1629   MCV 82 01/16/2017 1502   MCV 97.5 12/08/2012 1513   MCH 32.4 05/13/2023 1927   MCHC 30.9 05/13/2023 1927   RDW 13.4 05/13/2023 1927   RDW 11.8 01/01/2023 1629   RDW 17.6 (H) 01/16/2017 1502   RDW 14.6 (H) 12/08/2012 1513   Iron /TIBC/Ferritin/ %Sat    Component Value Date/Time   IRON  96 06/03/2023 1144   IRON  36 (L) 01/16/2017 1502   TIBC 286 06/03/2023 1144   TIBC 434 01/16/2017 1502   FERRITIN 26 06/03/2023 1144   FERRITIN 8 (L) 01/16/2017 1502   IRONPCTSAT 34 06/03/2023 1144   IRONPCTSAT 8 (L) 01/16/2017 1502   Lipid Panel     Component Value Date/Time   CHOL 158 09/10/2022 1256   TRIG 62 09/10/2022 1256   HDL 66 09/10/2022 1256   CHOLHDL 2.4 09/10/2022 1256   LDLCALC 80 09/10/2022 1256   Hepatic Function Panel     Component Value Date/Time   PROT 6.4 (L) 05/13/2023 1927   PROT 6.8 01/01/2023 1629   PROT 7.7 01/16/2017 1502   PROT 7.4 11/10/2012 1050   ALBUMIN 3.3 (L) 05/13/2023 1927   ALBUMIN 4.0 01/01/2023 1629   ALBUMIN 3.8 11/10/2012 1050   AST 30 05/13/2023 1927   AST 35 08/07/2022 1020   AST 34 11/10/2012 1050   ALT 21 05/13/2023 1927   ALT 33 08/07/2022 1020   ALT 23 01/16/2017 1502   ALT 30 11/10/2012 1050   ALKPHOS 62 05/13/2023 1927   ALKPHOS 84 01/16/2017 1502   ALKPHOS 66 11/10/2012 1050   BILITOT 0.3 05/13/2023 1927   BILITOT 0.3 01/01/2023 1629   BILITOT 0.3 08/07/2022 1020   BILITOT 0.57 11/10/2012 1050      Component Value Date/Time   TSH 1.200 06/03/2023 1144     Assessment and Plan:   Weight gain following gastric bypass surgery Reviewed patient's bariatric surgery history briefly.  She has regained weight  post gastric bypass surgery medically due to chronic back pain, inability to exercise and mood disorder.  She has gotten out of a post-bariatric surgery lifestyle, consuming excess amounts of sugar and carbohydrates with little lean protein.  Will be working on getting her back on a post bariatric surgery diet, obtaining labs next visit. We briefly discussed adding in things like [place] yogurt, cottage cheese, protein shakes and protein bars given her dislike for meat  Class 3 severe obesity due to excess calories with serious comorbidity and body mass index (BMI) of  40.0 to 44.9 in adult  Chronic pain syndrome She is seeing pain management on multiple medications for chronic pain.  She has noted weight gain during gabapentin .  She has some emotional eating as well.  Continue current medications.  Consider water exercises.       Obesity Treatment / Action Plan:  Patient will work on garnering support from family and friends to begin weight loss journey. Will work on eliminating or reducing the presence of highly palatable, calorie dense foods in the home. Will complete provided nutritional and psychosocial assessment questionnaire before the next appointment. Will be scheduled for indirect calorimetry to determine resting energy expenditure in a fasting state.  This will allow us  to create a reduced calorie, high-protein meal plan to promote loss of fat mass while preserving muscle mass. Will think about ideas on how to incorporate physical activity into their daily routine. Counseled on the health benefits of losing 5%-15% of total body weight. Was counseled on nutritional approaches to weight loss and benefits of reducing processed foods and consuming plant-based foods and high quality protein as part of nutritional weight management. Was counseled on pharmacotherapy and role as an adjunct in weight management.   Obesity Education Performed Today:  She was weighed on the bioimpedance  scale and results were discussed and documented in the synopsis.  We discussed obesity as a disease and the importance of a more detailed evaluation of all the factors contributing to the disease.  We discussed the importance of long term lifestyle changes which include nutrition, exercise and behavioral modifications as well as the importance of customizing this to her specific health and social needs.  We discussed the benefits of reaching a healthier weight to alleviate the symptoms of existing conditions and reduce the risks of the biomechanical, metabolic and psychological effects of obesity.  Makayla Aleyssa Palomar appears to be in the action stage of change and states they are ready to start intensive lifestyle modifications and behavioral modifications.  25 minutes was spent today on this visit including the above counseling, pre-visit chart review, and post-visit documentation.  Reviewed by clinician on day of visit: allergies, medications, problem list, medical history, surgical history, family history, social history, and previous encounter notes pertinent to obesity diagnosis.    Micky Albee, D.O. DABFM, [facility] [address] [place], [place] [address] [phone]

## 2023-06-18 ENCOUNTER — Ambulatory Visit: Admitting: Family Medicine

## 2023-06-18 ENCOUNTER — Encounter: Payer: Self-pay | Admitting: Family Medicine

## 2023-06-18 ENCOUNTER — Ambulatory Visit: Payer: Self-pay | Admitting: Family Medicine

## 2023-06-18 ENCOUNTER — Encounter: Payer: Self-pay | Admitting: Internal Medicine

## 2023-06-18 VITALS — BP 112/70 | HR 69 | Temp 98.1°F

## 2023-06-18 DIAGNOSIS — M545 Low back pain, unspecified: Secondary | ICD-10-CM

## 2023-06-18 DIAGNOSIS — N3000 Acute cystitis without hematuria: Secondary | ICD-10-CM | POA: Diagnosis not present

## 2023-06-18 DIAGNOSIS — R350 Frequency of micturition: Secondary | ICD-10-CM | POA: Diagnosis not present

## 2023-06-18 LAB — POCT URINALYSIS DIPSTICK
Bilirubin, UA: NEGATIVE
Glucose, UA: NEGATIVE
Ketones, UA: NEGATIVE
Nitrite, UA: POSITIVE
Protein, UA: NEGATIVE
Spec Grav, UA: 1.01 (ref 1.010–1.025)
Urobilinogen, UA: 0.2 U/dL
pH, UA: 7 (ref 5.0–8.0)

## 2023-06-18 MED ORDER — NITROFURANTOIN MONOHYD MACRO 100 MG PO CAPS: 100.0000 mg | ORAL_CAPSULE | Freq: Two times a day (BID) | ORAL | 0 refills | Status: AC

## 2023-06-18 NOTE — Progress Notes (Signed)
 I,Jameka J Llittleton, CMA,acting as a Neurosurgeon for [employer], NP.,have documented all relevant documentation on the behalf of Melodie Spry, NP,as directed by  Melodie Spry, NP while in the presence of Melodie Spry, NP.  Subjective:  Patient ID: Natalie Guerrero , female    DOB: 03/24/1965 , 58 y.o.   MRN: [id number]  Chief Complaint  Patient presents with   Back Pain    HPI  Patient is a 58 year old female presents today for evaluation of urinary tract infection. She reports urinary frequency, urgency, flank pain, low back pain, headaches and she noticed some blood in her wipes when she cleaned up herself.ently and having more headaches. She reports sometimes when she wipes she sees a little bit of blood.      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   GERD (gastroesophageal reflux disease) 2019   Glaucoma 2023   H/O blood clots    Heart murmur 09-2019   Hyperlipidemia LDL goal <100 09/15/2022   Hypothyroidism 12/08/2012   Iron  deficiency anemia, unspecified 11/10/2012   Iron  malabsorption 01/20/2017   Joint pain    Neuromuscular disorder (HCC) 2012   Since back surgery   Obesity    Thyroid  disease hypothroidism   Vitamin D  deficiency      Family History  Problem Relation Age of Onset   Heart failure Mother    Anxiety disorder Mother    Arthritis Mother    Heart disease Mother    Hypertension Mother    Stroke Mother    Varicose Veins Mother    Depression Mother    Prostate cancer Father    Alcohol abuse Father    Arthritis Father    Cancer Father    COPD Father    Depression Father    Hypertension Father    Obesity Father    Alcohol abuse Brother    Anxiety disorder Brother     Arthritis Brother    Drug abuse Brother    Hypertension Brother    Alcohol abuse Brother    Anxiety disorder Brother    Drug abuse Brother    Early death Brother    Heart disease Brother    Hypertension Brother    Stroke Brother    Anxiety disorder Sister    Obesity Sister    Hypertension Sister    Obesity Brother    Depression Brother    Colon cancer Neg Hx    Diabetes Neg Hx    Kidney disease Neg Hx    Liver disease Neg Hx    Neuropathy Neg Hx      Current Outpatient Medications:    b complex vitamins capsule, Take 1 capsule by mouth daily., Disp: , Rfl:    benzonatate  (TESSALON  PERLES) 100 MG capsule, Take 1 capsule (100 mg total) by mouth 3 (three) times daily as needed for cough., Disp: 30 capsule, Rfl: 0   brimonidine (ALPHAGAN) 0.2 % ophthalmic solution, Place 1 drop into the right eye 2 (two) times daily., Disp: , Rfl:    buPROPion  (WELLBUTRIN  XL) 300 MG 24 hr tablet, TAKE 1 TABLET (300 MG TOTAL) BY MOUTH EVERY  MORNING., Disp: 90 tablet, Rfl: 1   cetirizine (ZYRTEC) 10 MG tablet, Take 10 mg by mouth daily., Disp: , Rfl:    Cholecalciferol (VITAMIN D3) 50 MCG (2000 UT) capsule, Take 2,000 Units by mouth daily., Disp: , Rfl:    Collagen Hydrolysate POWD, by Does not apply route., Disp: , Rfl:    diclofenac Sodium (VOLTAREN) 1 % GEL, as needed (pain)., Disp: , Rfl:    docusate sodium (COLACE) 100 MG capsule, Take 200 mg by mouth as needed for moderate constipation., Disp: , Rfl:    gabapentin  (NEURONTIN ) 300 MG capsule, Take 300 mg by mouth 3 (three) times daily., Disp: , Rfl:    guaiFENesin (MUCINEX) 600 MG 12 hr tablet, Take by mouth 2 (two) times daily., Disp: , Rfl:    ibuprofen  (ADVIL ) 800 MG tablet, Take 800 mg by mouth every 8 (eight) hours as needed., Disp: , Rfl:    imipramine (TOFRANIL) 50 MG tablet, 50 mg 2 (two) times daily., Disp: , Rfl:    levothyroxine  (SYNTHROID ) 100 MCG tablet, TAKE 1 TABLET MON-FRI AND TAKE 1/2 PILL ON SATURDAY/SUNDAY., Disp: 78 tablet,  Rfl: 1   Magnesium  200 MG TABS, Take 4 tablets by mouth daily., Disp: , Rfl:    montelukast  (SINGULAIR ) 10 MG tablet, TAKE 1 TABLET BY MOUTH EVERY DAY, Disp: 90 tablet, Rfl: 1   naloxone (NARCAN) nasal spray 4 mg/0.1 mL, See admin instructions., Disp: , Rfl:    NOREL AD 4-10-325 MG TABS, TAKE 1 TABLET BY MOUTH TWICE A DAY AS NEEDED, Disp: 20 tablet, Rfl: 0   omeprazole  (PRILOSEC) 40 MG capsule, TAKE 1 CAPSULE BY MOUTH EVERY DAY, Disp: 90 capsule, Rfl: 1   OVER THE COUNTER MEDICATION, Tea made with ginger root and tumeric root., Disp: , Rfl:    Oxycodone  HCl 20 MG TABS, SMARTSIG:0.5-1 Tablet(s) By Mouth 4-6 Times Daily, Disp: , Rfl:    RESTASIS 0.05 % ophthalmic emulsion, Place 1 drop into both eyes as needed. Dry eyes, Disp: , Rfl:    Sod Picosulfate-Mag Ox-Cit Acd (CLENPIQ) 10-3.5-12 MG-GM -GM/175ML SOLN, 175 ML ORALLY TWICE for 2, Disp: , Rfl:    tiZANidine (ZANAFLEX) 4 MG tablet, every 4 (four) hours as needed., Disp: , Rfl:    topiramate (TOPAMAX) 100 MG tablet, Take 100 mg by mouth 4 (four) times daily., Disp: , Rfl:    Zinc Citrate (ZINC GUMMY PO), Take by mouth daily., Disp: , Rfl:   Current Facility-Administered Medications:    triamcinolone  acetonide (KENALOG ) 10 MG/ML injection 20 mg, 20 mg, Other, Once, Regal, Angus Kenning, DPM   Allergies  Allergen Reactions   Codeine Itching     Review of Systems  Constitutional: Negative.   HENT: Negative.    Respiratory: Negative.    Cardiovascular: Negative.   Genitourinary:  Positive for flank pain and frequency.  Musculoskeletal:  Positive for back pain.  Skin: Negative.      Today's Vitals   06/18/23 1159  BP: 112/70  Pulse: 69  Temp: 98.1 F (36.7 C)  TempSrc: Oral  PainSc: 5   PainLoc: Back   There is no height or weight on file to calculate BMI.  Wt Readings from Last 3 Encounters:  06/25/23 256 lb (116.1 kg)  06/16/23 257 lb (116.6 kg)  06/03/23 253 lb (114.8 kg)    The 10-year ASCVD risk score (Arnett DK, et al.,  2019) is: 2.4%   Values used to calculate the score:     Age: 3 years  Sex: Female     Is Non-Hispanic African American: Yes     Diabetic: No     Tobacco smoker: No     Systolic Blood Pressure: 118 mmHg     Is BP treated: No     HDL Cholesterol: 66 mg/dL     Total Cholesterol: 158 mg/dL  Objective:  Physical Exam HENT:     Head: Normocephalic.  Neurological:     Mental Status: She is alert.         Assessment And Plan:  Urinary frequency -     POCT urinalysis dipstick  Acute cystitis without hematuria -     Nitrofurantoin  Monohyd Macro; Take 1 capsule (100 mg total) by mouth 2 (two) times daily for 5 days.  Dispense: 10 capsule; Refill: 0    Return for keep next appt.  Patient was given opportunity to ask questions. Patient verbalized understanding of the plan and was able to repeat key elements of the plan. All questions were answered to their satisfaction.    I, Melodie Spry, NP, have reviewed all documentation for this visit. The documentation of 06/29/2023 for the exam, diagnosis, procedures, and orders are all accurate and complete.    IF YOU HAVE BEEN REFERRED TO A SPECIALIST, IT MAY TAKE 1-2 WEEKS TO SCHEDULE/PROCESS THE REFERRAL. IF YOU HAVE NOT HEARD FROM US /SPECIALIST IN TWO WEEKS, PLEASE GIVE US  A CALL AT [phone].

## 2023-06-25 ENCOUNTER — Ambulatory Visit: Admitting: Family Medicine

## 2023-06-25 ENCOUNTER — Encounter: Payer: Self-pay | Admitting: Family Medicine

## 2023-06-25 VITALS — BP 118/80 | HR 77 | Temp 98.2°F | Ht 65.5 in | Wt 256.0 lb

## 2023-06-25 DIAGNOSIS — K909 Intestinal malabsorption, unspecified: Secondary | ICD-10-CM

## 2023-06-25 DIAGNOSIS — M170 Bilateral primary osteoarthritis of knee: Secondary | ICD-10-CM

## 2023-06-25 DIAGNOSIS — G894 Chronic pain syndrome: Secondary | ICD-10-CM | POA: Diagnosis not present

## 2023-06-25 DIAGNOSIS — Z9884 Bariatric surgery status: Secondary | ICD-10-CM | POA: Diagnosis not present

## 2023-06-25 DIAGNOSIS — R0602 Shortness of breath: Secondary | ICD-10-CM

## 2023-06-25 DIAGNOSIS — Z6841 Body Mass Index (BMI) 40.0 and over, adult: Secondary | ICD-10-CM

## 2023-06-25 DIAGNOSIS — E66813 Obesity, class 3: Secondary | ICD-10-CM

## 2023-06-25 DIAGNOSIS — F331 Major depressive disorder, recurrent, moderate: Secondary | ICD-10-CM | POA: Diagnosis not present

## 2023-06-25 DIAGNOSIS — R5383 Other fatigue: Secondary | ICD-10-CM

## 2023-06-25 NOTE — Progress Notes (Signed)
 At a Glance:  Vitals Temp: 98.2 F (36.8 C) BP: 118/80 Pulse Rate: 77 SpO2: 99 %   Anthropometric Measurements Height: 5' 5.5" (1.664 m) Weight: 256 lb (116.1 kg) BMI (Calculated): 41.94 Starting Weight: 256lb Peak Weight: 257lb   Body Composition  Body Fat %: 55.5 % Fat Mass (lbs): 142 lbs Muscle Mass (lbs): 108.2 lbs Visceral Fat Rating : 18   Other Clinical Data RMR: 1555 Fasting: yes Labs: yes Today's Visit #: 1 Starting Date: 06/25/23    EKG: Normal sinus rhythm, rate 77.  Indirect Calorimeter completed today shows a VO2 of 226 and a REE of 1555.  Her calculated basal metabolic rate is 1610 thus her basal metabolic rate is worse than expected.  Chief Complaint:  Obesity   Subjective:  Natalie Guerrero (MR# [id number]) is a 58 y.o. female who presents for evaluation and treatment of obesity and related comorbidities.   Natalie Guerrero is currently in the action stage of change and ready to dedicate time achieving and maintaining a healthier weight. Natalie Guerrero is interested in becoming our patient and working on intensive lifestyle modifications including (but not limited to) diet and exercise for weight loss.  Natalie Guerrero has been struggling with her weight. She has been unsuccessful in either losing weight, maintaining weight loss, or reaching her healthy weight goal.  She is status post Roux-en-Y gastric bypass surgery in 2007 with [place] surgery.  Her preop weight was 319 pounds her postop nadir weight 200 pounds.  She started to regain weight after a fall in 2010.  She has had a chronic back pain status post fusion which did not relieve her pain.  She now has limited exercise due to both back pain and DJD pain in her knees but has declined total knee arthroplasty.  Natalie Guerrero habits were reviewed today and are as follows: her desired weight is 200 lb, she has been heavy most of her life, she started regaining weight after her gastric bypass surgery  after her back injury.  She has been much less physically active., she has significant food cravings issues, she snacks frequently in the evenings, she frequently makes poor food choices, and she struggles with emotional eating.  She still has some volume restriction at mealtime from her gastric bypass.  She still takes a multivitamin daily and has required IV iron  infusions.  Her [employer] includes sweet, graham crackers, breads, chocolate.  She does not boredom and stress eating.  She denies any dumping syndrome.  She is undergoing emotional turmoil with the 4-year anniversary of her mom's passing and her husband's poor health at age 41.   Other Fatigue Natalie Guerrero denies daytime somnolence and denies waking up still tired. Natalie Guerrero generally gets 8 hours of sleep per night, and states that she has generally restful sleep. Snoring is not present. Apneic episodes are not present. Epworth Sleepiness Score is 6.   Shortness of Breath Natalie ([place]) notes increasing shortness of breath with exercising and seems to be worsening over time with weight gain. She notes getting out of breath sooner with activity than she used to. This has gotten worse recently. Natalie Guerrero denies shortness of breath at rest or orthopnea.   Depression Screen Natalie Guerrero Food and Mood (modified PHQ-9) score was 9.     06/03/2023   10:56 AM  Depression screen PHQ 2/9  Decreased Interest 0  Down, Depressed, Hopeless 1  PHQ - 2 Score 1  Altered sleeping 3  Tired, decreased energy 3  Change in appetite 0  Feeling bad or failure about yourself  0  Trouble concentrating 0  Moving slowly or fidgety/restless 0  Suicidal thoughts 0  PHQ-9 Score 7  Difficult doing work/chores Somewhat difficult     Assessment and Plan:   Other Fatigue Natalie Guerrero does feel that her weight is causing her energy to be lower than it should be. Fatigue may be related to obesity, depression or many other causes. Labs will be ordered, and in  the meanwhile, Natalie ([place]) will focus on self care including making healthy food choices, increasing physical activity and focusing on stress reduction.  Shortness of Breath Natalie ([place]) does feel that she gets out of breath more easily that she used to when she exercises. Natalie Guerrero shortness of breath appears to be obesity related and exercise induced. She has agreed to work on weight loss and gradually increase exercise to treat her exercise induced shortness of breath. Will continue to monitor closely.    Problem List Items Addressed This Visit     Fatigue   Relevant Orders   VITAMIN D  25 Hydroxy (Vit-D Deficiency, Fractures)   Insulin , random   Hemoglobin A1c   Chronic pain syndrome Chronic pain has been a factor in her weight regain post gastric bypass surgery with chronic back pain and now worsening DJD pain in both knees.  She is ambulating with a cane.  She is taking gabapentin  300 mg 3 times daily, oxycodone  20 mg 4-6 times a day managed by Natalie Barer, NP.  Continue current plan of care per pain management.  We discussed options for exercise including use of an exercise bike or water exercise.   Moderate episode of recurrent major depressive disorder (HCC) She is currently on Wellbutrin  XL 300 mg once daily for mood.  Her mood has been worse more recently with a 4-year anniversary of coming up on her mom's passing and her husband for help at age 73.  She has been doing more emotional eating and craving sugar.  We discussed the importance of addressing mood and weight management.  Work on improving sleep at night, improving nutrition, adding in regular exercise and working on self-care.  Keep junk food trigger foods out of the house.   Other Visit Diagnoses       SOBOE (shortness of breath on exertion)    -  Primary     Intestinal malabsorption, unspecified type       Relevant Orders   Prealbumin   Vitamin B1     Obesity, Class III, BMI 40-49.9 (morbid obesity)         BMI  40.0-44.9, adult (HCC)         Weight gain following gastric bypass surgery    Reviewed patient's history with weight regain post gastric bypass surgery.  She still has some volume control at mealtime likely has some stretch to her pouch and has some symptoms of food getting stuck in the bottom of her gastric outlet without vomiting or pain.  She is also eating at a fast pace and not chewing food well.  She avoids drinking at mealtime.  She has no symptoms of dumping syndrome, allowing excess amounts of added sugar to her diet which has contributed to weight gain including sugar sweetened beverages.  Explained the getting her back to her previous nadir weight of 200 pounds may be harder now that she is not able to exercise vigorously like she was several years ago.  We discussed importance of finding ways to stay physically active including water exercise  or use of her bike.  Will be working to resume a post bariatric surgery diet by starting a liver shrinking diet for the first 2 weeks.      Primary osteoarthritis of both knees    Worsening she is ambulating with a cane and is managed by orthopedics.  She is fearful of having knee replacement surgery due to her failed back fusion.  She has to see improvement in knee pain with weight reduction.  Begin active plan for weight loss.       Elloree is currently in the action stage of change and her goal is to continue with weight loss efforts. I recommend Natalie ([place]) begin the structured treatment plan as follows:  She has agreed to following a lower carbohydrate, vegetable and lean protein rich diet plan Handout of liver shrinking diet given This will be a 2 week plan including : 2 protein shakes, 2 low carb meals, 1-2 fruit servings per day.   Exercise goals: All adults should avoid inactivity. Some activity is better than none, and adults who participate in any amount of physical activity, gain some health benefits. Without nearby options for water  exercise using Silver sneakers  Behavioral modification strategies:increasing lean protein intake, increasing vegetables, increase H2O intake, decrease liquid calories, decreasing eating out, keeping healthy foods in the home, avoiding temptations, planning for success, and decrease junk food  Continue and sugar-free electrolyte drinks daily  She was informed of the importance of frequent follow-up visits to maximize her success with intensive lifestyle modifications for her multiple health conditions. She was informed we would discuss her lab results at her next visit unless there is a critical issue that needs to be addressed sooner. Madeleine agreed to keep her next visit at the agreed upon time to discuss these results.  Objective:  General: Cooperative, alert, well developed, in no acute distress. HEENT: Conjunctivae and lids unremarkable. Cardiovascular: Regular rhythm.  Lungs: Normal work of breathing. Neurologic: No focal deficits.   Lab Results  Component Value Date   CREATININE 0.86 06/03/2023   BUN 15 06/03/2023   NA 139 06/03/2023   K 4.9 06/03/2023   CL 103 06/03/2023   CO2 22 06/03/2023   Lab Results  Component Value Date   ALT 21 05/13/2023   AST 30 05/13/2023   ALKPHOS 62 05/13/2023   BILITOT 0.3 05/13/2023   Lab Results  Component Value Date   HGBA1C 5.2 09/10/2022   HGBA1C 5.6 03/28/2021   HGBA1C 5.3 07/04/2019   HGBA1C 5.0 07/01/2018   HGBA1C 4.9 02/25/2017   Lab Results  Component Value Date   INSULIN  2.8 07/22/2021   INSULIN  5.8 01/08/2021   Lab Results  Component Value Date   TSH 1.200 06/03/2023   Lab Results  Component Value Date   CHOL 158 09/10/2022   HDL 66 09/10/2022   LDLCALC 80 09/10/2022   TRIG 62 09/10/2022   CHOLHDL 2.4 09/10/2022   Lab Results  Component Value Date   WBC 5.9 05/13/2023   HGB 9.7 (L) 05/13/2023   HCT 31.4 (L) 05/13/2023   MCV 105.0 (H) 05/13/2023   PLT 278 05/13/2023   Lab Results  Component Value Date    IRON  96 06/03/2023   TIBC 286 06/03/2023   FERRITIN 26 06/03/2023    Attestation Statements:  Reviewed by clinician on day of visit: allergies, medications, problem list, medical history, surgical history, family history, social history, and previous encounter notes.  Time spent on visit including pre-visit chart review and  post-visit charting and face- to face care including nutritional counseling, review of EKG, interpretation of body composition scale and indirect calorimetry and nutrition prescription  was 54 minutes.   Micky Albee, D.O. DABFM, DABOM [facility] [address] [place], [place] [address] [phone]

## 2023-06-29 DIAGNOSIS — N3000 Acute cystitis without hematuria: Secondary | ICD-10-CM | POA: Insufficient documentation

## 2023-06-29 DIAGNOSIS — R350 Frequency of micturition: Secondary | ICD-10-CM | POA: Insufficient documentation

## 2023-06-29 DIAGNOSIS — M545 Low back pain, unspecified: Secondary | ICD-10-CM | POA: Insufficient documentation

## 2023-06-29 LAB — HEMOGLOBIN A1C
Est. average glucose Bld gHb Est-mCnc: 108 mg/dL
Hgb A1c MFr Bld: 5.4 % (ref 4.8–5.6)

## 2023-06-29 LAB — VITAMIN D 25 HYDROXY (VIT D DEFICIENCY, FRACTURES): Vit D, 25-Hydroxy: 56.1 ng/mL (ref 30.0–100.0)

## 2023-06-29 LAB — INSULIN, RANDOM: INSULIN: 1.6 u[IU]/mL — ABNORMAL LOW (ref 2.6–24.9)

## 2023-06-29 LAB — PREALBUMIN: PREALBUMIN: 24 mg/dL (ref 10–36)

## 2023-06-29 LAB — VITAMIN B1: Thiamine: 104.8 nmol/L (ref 66.5–200.0)

## 2023-06-30 ENCOUNTER — Ambulatory Visit: Payer: Self-pay | Admitting: Family Medicine

## 2023-06-30 ENCOUNTER — Encounter: Payer: Self-pay | Admitting: Family Medicine

## 2023-06-30 ENCOUNTER — Encounter: Payer: Self-pay | Admitting: Internal Medicine

## 2023-07-11 ENCOUNTER — Emergency Department (HOSPITAL_COMMUNITY)
Admission: EM | Admit: 2023-07-11 | Discharge: 2023-07-11 | Disposition: A | Discharge status: Home / Self Care | Attending: Emergency Medicine | Admitting: Emergency Medicine

## 2023-07-11 ENCOUNTER — Emergency Department (HOSPITAL_COMMUNITY)

## 2023-07-11 ENCOUNTER — Other Ambulatory Visit: Payer: Self-pay

## 2023-07-11 DIAGNOSIS — M542 Cervicalgia: Secondary | ICD-10-CM | POA: Insufficient documentation

## 2023-07-11 DIAGNOSIS — S299XXA Unspecified injury of thorax, initial encounter: Secondary | ICD-10-CM | POA: Diagnosis not present

## 2023-07-11 DIAGNOSIS — Z79899 Other long term (current) drug therapy: Secondary | ICD-10-CM | POA: Diagnosis not present

## 2023-07-11 DIAGNOSIS — M791 Myalgia, unspecified site: Secondary | ICD-10-CM | POA: Diagnosis not present

## 2023-07-11 DIAGNOSIS — I6523 Occlusion and stenosis of bilateral carotid arteries: Secondary | ICD-10-CM | POA: Diagnosis not present

## 2023-07-11 DIAGNOSIS — S0990XA Unspecified injury of head, initial encounter: Secondary | ICD-10-CM | POA: Diagnosis not present

## 2023-07-11 DIAGNOSIS — D509 Iron deficiency anemia, unspecified: Secondary | ICD-10-CM | POA: Diagnosis not present

## 2023-07-11 DIAGNOSIS — M545 Low back pain, unspecified: Secondary | ICD-10-CM | POA: Diagnosis not present

## 2023-07-11 DIAGNOSIS — Y92410 Unspecified street and highway as the place of occurrence of the external cause: Secondary | ICD-10-CM | POA: Insufficient documentation

## 2023-07-11 DIAGNOSIS — R519 Headache, unspecified: Secondary | ICD-10-CM | POA: Diagnosis not present

## 2023-07-11 DIAGNOSIS — E039 Hypothyroidism, unspecified: Secondary | ICD-10-CM | POA: Insufficient documentation

## 2023-07-11 DIAGNOSIS — M549 Dorsalgia, unspecified: Secondary | ICD-10-CM | POA: Diagnosis not present

## 2023-07-11 DIAGNOSIS — S3992XA Unspecified injury of lower back, initial encounter: Secondary | ICD-10-CM | POA: Diagnosis not present

## 2023-07-11 DIAGNOSIS — G8929 Other chronic pain: Secondary | ICD-10-CM | POA: Insufficient documentation

## 2023-07-11 DIAGNOSIS — I700 Atherosclerosis of aorta: Secondary | ICD-10-CM | POA: Diagnosis not present

## 2023-07-11 DIAGNOSIS — S199XXA Unspecified injury of neck, initial encounter: Secondary | ICD-10-CM | POA: Diagnosis not present

## 2023-07-11 DIAGNOSIS — S3991XA Unspecified injury of abdomen, initial encounter: Secondary | ICD-10-CM | POA: Diagnosis not present

## 2023-07-11 DIAGNOSIS — M129 Arthropathy, unspecified: Secondary | ICD-10-CM | POA: Diagnosis not present

## 2023-07-11 DIAGNOSIS — S3993XA Unspecified injury of pelvis, initial encounter: Secondary | ICD-10-CM | POA: Diagnosis not present

## 2023-07-11 LAB — SAMPLE TO BLOOD BANK

## 2023-07-11 LAB — I-STAT CHEM 8, ED
BUN: 27 mg/dL — ABNORMAL HIGH (ref 6–20)
Calcium, Ion: 1.22 mmol/L (ref 1.15–1.40)
Chloride: 98 mmol/L (ref 98–111)
Creatinine, Ser: 1.1 mg/dL — ABNORMAL HIGH (ref 0.44–1.00)
Glucose, Bld: 86 mg/dL (ref 70–99)
HCT: 35 % — ABNORMAL LOW (ref 36.0–46.0)
Hemoglobin: 11.9 g/dL — ABNORMAL LOW (ref 12.0–15.0)
Potassium: 4.1 mmol/L (ref 3.5–5.1)
Sodium: 135 mmol/L (ref 135–145)
TCO2: 24 mmol/L (ref 22–32)

## 2023-07-11 LAB — COMPREHENSIVE METABOLIC PANEL WITH GFR
ALT: 28 U/L (ref 0–44)
AST: 29 U/L (ref 15–41)
Albumin: 3.9 g/dL (ref 3.5–5.0)
Alkaline Phosphatase: 66 U/L (ref 38–126)
Anion gap: 8 (ref 5–15)
BUN: 27 mg/dL — ABNORMAL HIGH (ref 6–20)
CO2: 25 mmol/L (ref 22–32)
Calcium: 9.3 mg/dL (ref 8.9–10.3)
Chloride: 101 mmol/L (ref 98–111)
Creatinine, Ser: 1.07 mg/dL — ABNORMAL HIGH (ref 0.44–1.00)
GFR, Estimated: >60 mL/min (ref >60)
Glucose, Bld: 91 mg/dL (ref 70–99)
Potassium: 4.1 mmol/L (ref 3.5–5.1)
Sodium: 134 mmol/L — ABNORMAL LOW (ref 135–145)
Total Bilirubin: 0.4 mg/dL (ref 0.0–1.2)
Total Protein: 6.7 g/dL (ref 6.5–8.1)

## 2023-07-11 LAB — CBC
HCT: 34 % — ABNORMAL LOW (ref 36.0–46.0)
Hemoglobin: 10.7 g/dL — ABNORMAL LOW (ref 12.0–15.0)
MCH: 31.8 pg (ref 26.0–34.0)
MCHC: 31.5 g/dL (ref 30.0–36.0)
MCV: 100.9 fL — ABNORMAL HIGH (ref 80.0–100.0)
Platelets: 298 10*3/uL (ref 150–400)
RBC: 3.37 MIL/uL — ABNORMAL LOW (ref 3.87–5.11)
RDW: 12.4 % (ref 11.5–15.5)
WBC: 5.1 10*3/uL (ref 4.0–10.5)
nRBC: 0 % (ref 0.0–0.2)

## 2023-07-11 LAB — I-STAT CG4 LACTIC ACID, ED: Lactic Acid, Venous: 0.9 mmol/L (ref 0.5–1.9)

## 2023-07-11 LAB — URINALYSIS, ROUTINE W REFLEX MICROSCOPIC
Bilirubin Urine: NEGATIVE
Glucose, UA: NEGATIVE mg/dL
Hgb urine dipstick: NEGATIVE
Ketones, ur: NEGATIVE mg/dL
Leukocytes,Ua: NEGATIVE
Nitrite: POSITIVE — AB
Protein, ur: NEGATIVE mg/dL
Specific Gravity, Urine: 1.006 (ref 1.005–1.030)
pH: 7 (ref 5.0–8.0)

## 2023-07-11 LAB — PROTIME-INR
INR: 1 (ref 0.8–1.2)
Prothrombin Time: 13.6 s (ref 11.4–15.2)

## 2023-07-11 LAB — ETHANOL: Alcohol, Ethyl (B): <15 mg/dL (ref <15)

## 2023-07-11 MED ORDER — FENTANYL CITRATE PF 50 MCG/ML IJ SOSY
50.0000 ug | PREFILLED_SYRINGE | Freq: Once | INTRAMUSCULAR | Status: AC
  Administered 2023-07-11: 50 ug via INTRAVENOUS
  Filled 2023-07-11: qty 1

## 2023-07-11 MED ORDER — IOHEXOL 350 MG/ML SOLN
75.0000 mL | Freq: Once | INTRAVENOUS | Status: AC | PRN
  Administered 2023-07-11: 75 mL via INTRAVENOUS

## 2023-07-11 MED ORDER — ONDANSETRON HCL 4 MG/2ML IJ SOLN
4.0000 mg | Freq: Once | INTRAMUSCULAR | Status: AC
  Administered 2023-07-11: 4 mg via INTRAVENOUS
  Filled 2023-07-11: qty 2
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
## 2023-07-13 ENCOUNTER — Encounter: Payer: Self-pay | Admitting: Internal Medicine

## 2023-07-13 ENCOUNTER — Ambulatory Visit: Admitting: Family Medicine

## 2023-07-20 ENCOUNTER — Ambulatory Visit (INDEPENDENT_AMBULATORY_CARE_PROVIDER_SITE_OTHER): Admitting: Internal Medicine

## 2023-07-20 ENCOUNTER — Encounter: Payer: Self-pay | Admitting: Internal Medicine

## 2023-07-20 DIAGNOSIS — M542 Cervicalgia: Secondary | ICD-10-CM | POA: Insufficient documentation

## 2023-07-20 DIAGNOSIS — I700 Atherosclerosis of aorta: Secondary | ICD-10-CM | POA: Diagnosis not present

## 2023-07-20 DIAGNOSIS — Z87898 Personal history of other specified conditions: Secondary | ICD-10-CM | POA: Diagnosis not present

## 2023-07-20 MED ORDER — CYCLOBENZAPRINE HCL 10 MG PO TABS: 10.0000 mg | ORAL_TABLET | Freq: Three times a day (TID) | ORAL | 0 refills | Status: AC | PRN
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
## 2023-07-20 NOTE — Assessment & Plan Note (Signed)
 Recurrent near syncope episodes, improvement with dietary changes and hydration. Carotid artery evaluation pending. - Order neck ultrasound for carotid assessment. - Advise moderation of Gatorade Light due to artificial sweeteners and weight gain. - Consider alternative hydration like Nectar with Stevia.
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
## 2023-07-20 NOTE — Assessment & Plan Note (Signed)
 Incidental finding on CT performed on 07/10/23. Current LDL 80 mg/dL, goal LDL <40.  She does report family history of heart disease. Discussed cholesterol management and concerns about heart disease. - Discuss potential cholesterol medication, considering side effects. - Plan lipoprotein A test, she prefers to do at next visit - Encourage dietary modifications with increased fiber.

## 2023-07-20 NOTE — Patient Instructions (Signed)
 Atherosclerosis  Atherosclerosis is when plaque builds up in the arteries. This causes narrowing and hardening of the arteries. Arteries are blood vessels that carry blood from the heart to all parts of the body. This blood contains oxygen. Plaque occurs due to inflammation or from a buildup of fat, cholesterol, calcium, waste products of cells, and a clotting material in the blood (fibrin). Plaque decreases the amount of blood that can flow through the artery. Atherosclerosis can affect any artery in your body, including: Heart arteries. Damage to these arteries may lead to coronary artery disease, which can cause a heart attack. Brain arteries. Damage to these arteries may cause a stroke. Leg, arm, and pelvis arteries. Peripheral artery disease (PAD) may result from damage to these arteries. Kidney arteries. Kidney (renal) failure may result from damage to kidney arteries. Treatment may slow the disease and prevent further damage to your heart, brain, peripheral arteries, and kidneys. What are the causes? This condition develops slowly over many years. The inner layers of your arteries become damaged and allow the gradual buildup of plaque. The exact cause of atherosclerosis is not fully understood. Symptoms of atherosclerosis do not occur until an artery becomes narrow or blocked. What increases the risk? The following factors may make you more likely to develop this condition: Being middle-aged or older. Certain medical conditions, including: High blood pressure. High cholesterol. High blood fats (triglycerides). Diabetes. Sleep apnea. Obesity. Certain lab levels, including: Elevated C-reactive protein (CRP). This is a sign of increased inflammation in your body. Elevated homocysteine levels. This is an amino acid that is associated with heart and blood vessel disease. Using tobacco or nicotine products. A family history of atherosclerosis. Not exercising enough (sedentary  lifestyle). Being stressed. Drinking too much alcohol or using drugs, such as cocaine or methamphetamine. What are the signs or symptoms? Symptoms of atherosclerosis do not occur until the plaque severely narrows or blocks the artery, which decreases blood flow. Sometimes, atherosclerosis does not cause symptoms. Symptoms of this condition include: Coronary artery disease. This may cause chest pain and shortness of breath. Decreased blood supply to your brain, which may cause a stroke. Signs of a stroke may include sudden: Weakness or numbness in your face, arm, or leg, especially on one side of your body. Trouble walking or difficulty moving your arms or legs. Loss of balance or coordination. Confusion. Slurred speech. Trouble speaking, or trouble understanding speech, or both (aphasia). Vision changes in one or both eyes. This may be double vision, blurred vision, or loss of vision. Severe headache with no known cause. The headache is often described as the worst headache ever experienced. PAD, which may cause pain, numbness, or nonhealing wounds, often in your legs and hips. Renal failure. This may cause tiredness, problems with urination, swelling, and itchy skin. How is this diagnosed? This condition is diagnosed based on your medical history and a physical exam. During the exam, your health care provider will: Check your pulse in different places. Listen for a "whooshing" sound over your arteries (bruit). You may also have tests, such as: Blood tests to check your levels of cholesterol, triglycerides, blood sugar, and CRP. Ankle-brachial index to compare blood pressure in your arms to blood pressure in your ankles to see how your blood is flowing. Heart (cardiac) tests. Electrocardiogram (ECG) to check for heart damage. Stress test to see how your heart reacts to exercise. Ultrasound tests. Ultrasound of your peripheral arteries to check blood flow. Echocardiogram to get images of  your heart's  chambers and valves. X-ray tests. Chest X-ray to see if you have an enlarged heart, which is a sign of heart failure. CT scan to check for damage to your heart, brain, or arteries. Angiogram. This is a test where dye is injected and X-rays are used to see the blood flow in the arteries. How is this treated? This condition is treated with lifestyle changes as the first step. These may include: Changing your diet. Losing weight. Reducing stress. Exercising and being physically active more regularly. Quitting smoking. You may also need medicine to: Lower triglycerides and cholesterol. Control blood pressure. Prevent blood clots. Lower inflammation in your body. Control your blood sugar. Sometimes, surgery is needed to: Remove plaque from an artery (endarterectomy). Open or widen a narrowed heart artery or peripheral artery (angioplasty). Create a new path for your blood with one of these procedures: Heart (coronary) artery bypass graft surgery. Peripheral artery bypass graft surgery. Place a small mesh tube (stent) in an artery to open or widen a narrowed artery. Follow these instructions at home: Eating and drinking  Eat a heart-healthy diet. Talk with your health care provider or a dietitian if you need help. A heart-healthy diet involves: Limiting unhealthy fats and increasing healthy fats. Some examples of healthy fats are avocados and olive oil. Eating plant-based foods, such as fruits, vegetables, nuts, whole grains, and legumes (such as peas and lentils). If you drink alcohol: Limit how much you have to: 0-1 drink a day for women who are not pregnant. 0-2 drinks a day for men. Know how much alcohol is in a drink. In the [place] one drink equals one 12 oz bottle of beer (355 mL), one 5 oz glass of wine (148 mL), or one 1 oz glass of hard liquor (44 mL). Lifestyle  Maintain a healthy weight. Lose weight if your health care provider says that you need to do  that. Follow an exercise program as told by your health care provider. Do not use any products that contain nicotine or tobacco. These products include cigarettes, chewing tobacco, and vaping devices, such as e-cigarettes. If you need help quitting, ask your health care provider. Do not use drugs. General instructions Take over-the-counter and prescription medicines only as told by your health care provider. Manage other health conditions as told. Keep all follow-up visits. This is important. Contact a health care provider if you have: An irregular heartbeat. Unexplained tiredness (fatigue). Trouble urinating, or you are producing less urine or foamy urine. Swelling of your hands or feet, or itchy skin. Unexplained pain or numbness in your legs or hips. A wound that is slow to heal or is not healing. Get help right away if: You have any symptoms of a heart attack. These may be: Chest pain. This includes squeezing chest pain that may feel like indigestion (angina). Shortness of breath. Pain in your neck, jaw, arms, back, or stomach. Cold sweat. Nausea. Light-headedness. Sudden pain, numbness, or coldness in a limb. You have any symptoms of a stroke. "BE FAST" is an easy way to remember the main warning signs of a stroke: B - Balance. Signs are dizziness, sudden trouble walking, or loss of balance. E - Eyes. Signs are trouble seeing or a sudden change in vision. F - Face. Signs are sudden weakness or numbness of the face, or the face or eyelid drooping on one side. A - Arms. Signs are weakness or numbness in an arm. This happens suddenly and usually on one side of the body. S -  Speech. Signs are sudden trouble speaking, slurred speech, or trouble understanding what people say. T - Time. Time to call emergency services. Write down what time symptoms started. You have other signs of a stroke, such as: A sudden, severe headache with no known cause. Nausea or vomiting. Seizure. These  symptoms may represent a serious problem that is an emergency. Do not wait to see if the symptoms will go away. Get medical help right away. Call your local emergency services (911 in the [place] Do not drive yourself to the hospital. Summary Atherosclerosis is when plaque builds up in the arteries and causes narrowing and hardening of the arteries. Plaque occurs due to inflammation or from a buildup of fat, cholesterol, calcium, cellular waste products, and fibrin. This condition may not cause any symptoms. Symptoms of atherosclerosis do not occur until the plaque severely narrows or blocks the artery. Treatment starts with lifestyle changes and may include medicines. In some cases, surgery is needed. Get help right away if you have any symptoms of a heart attack or stroke. This information is not intended to replace advice given to you by your health care provider. Make sure you discuss any questions you have with your health care provider. Document Revised: 05/02/2020 Document Reviewed: 05/02/2020 Elsevier Patient Education  2024 [employer].

## 2023-07-21 DIAGNOSIS — G894 Chronic pain syndrome: Secondary | ICD-10-CM | POA: Diagnosis not present

## 2023-07-21 DIAGNOSIS — M5135 Other intervertebral disc degeneration, thoracolumbar region: Secondary | ICD-10-CM | POA: Diagnosis not present

## 2023-07-21 DIAGNOSIS — M5431 Sciatica, right side: Secondary | ICD-10-CM | POA: Diagnosis not present

## 2023-07-21 DIAGNOSIS — M5430 Sciatica, unspecified side: Secondary | ICD-10-CM | POA: Diagnosis not present

## 2023-07-21 DIAGNOSIS — Z79891 Long term (current) use of opiate analgesic: Secondary | ICD-10-CM | POA: Diagnosis not present

## 2023-07-21 DIAGNOSIS — M62830 Muscle spasm of back: Secondary | ICD-10-CM | POA: Diagnosis not present

## 2023-07-21 DIAGNOSIS — R202 Paresthesia of skin: Secondary | ICD-10-CM | POA: Diagnosis not present

## 2023-07-21 DIAGNOSIS — M179 Osteoarthritis of knee, unspecified: Secondary | ICD-10-CM | POA: Diagnosis not present

## 2023-07-21 DIAGNOSIS — F1120 Opioid dependence, uncomplicated: Secondary | ICD-10-CM | POA: Diagnosis not present

## 2023-07-29 ENCOUNTER — Ambulatory Visit (INDEPENDENT_AMBULATORY_CARE_PROVIDER_SITE_OTHER): Admitting: Family Medicine

## 2023-07-29 ENCOUNTER — Encounter: Payer: Self-pay | Admitting: Family Medicine

## 2023-07-29 VITALS — BP 112/75 | HR 85 | Temp 98.0°F | Ht 65.5 in | Wt 241.0 lb

## 2023-07-29 DIAGNOSIS — K909 Intestinal malabsorption, unspecified: Secondary | ICD-10-CM | POA: Diagnosis not present

## 2023-07-29 DIAGNOSIS — Z9884 Bariatric surgery status: Secondary | ICD-10-CM

## 2023-07-29 DIAGNOSIS — G894 Chronic pain syndrome: Secondary | ICD-10-CM | POA: Diagnosis not present

## 2023-07-29 DIAGNOSIS — Z6839 Body mass index (BMI) 39.0-39.9, adult: Secondary | ICD-10-CM | POA: Diagnosis not present

## 2023-07-29 DIAGNOSIS — E66812 Obesity, class 2: Secondary | ICD-10-CM

## 2023-07-29 DIAGNOSIS — E6609 Other obesity due to excess calories: Secondary | ICD-10-CM
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
## 2023-07-29 NOTE — Patient Instructions (Signed)
 You can create a meal plan using Chat GPT: ' 1400 calorie high protein meal plan' if you want more variety  Stay on current supplements  Continue home exercises as tolerated  Hydrate well with water between meals

## 2023-08-04 ENCOUNTER — Other Ambulatory Visit: Payer: Self-pay | Admitting: Internal Medicine

## 2023-08-04 DIAGNOSIS — Z87898 Personal history of other specified conditions: Secondary | ICD-10-CM

## 2023-08-04 DIAGNOSIS — R55 Syncope and collapse: Secondary | ICD-10-CM

## 2023-08-04 DIAGNOSIS — I700 Atherosclerosis of aorta: Secondary | ICD-10-CM

## 2023-08-04 DIAGNOSIS — M542 Cervicalgia: Secondary | ICD-10-CM

## 2023-08-07 ENCOUNTER — Ambulatory Visit (HOSPITAL_COMMUNITY)
Admission: RE | Admit: 2023-08-07 | Discharge: 2023-08-07 | Disposition: A | Discharge status: Home / Self Care | Source: Ambulatory Visit | Attending: Internal Medicine | Admitting: Internal Medicine

## 2023-08-07 DIAGNOSIS — R55 Syncope and collapse: Secondary | ICD-10-CM | POA: Diagnosis not present

## 2023-08-09 ENCOUNTER — Ambulatory Visit: Payer: Self-pay | Admitting: Internal Medicine

## 2023-08-12 DIAGNOSIS — M1712 Unilateral primary osteoarthritis, left knee: Secondary | ICD-10-CM | POA: Diagnosis not present

## 2023-08-12 DIAGNOSIS — M1711 Unilateral primary osteoarthritis, right knee: Secondary | ICD-10-CM | POA: Diagnosis not present

## 2023-08-18 DIAGNOSIS — M62830 Muscle spasm of back: Secondary | ICD-10-CM | POA: Diagnosis not present

## 2023-08-18 DIAGNOSIS — R202 Paresthesia of skin: Secondary | ICD-10-CM | POA: Diagnosis not present

## 2023-08-18 DIAGNOSIS — F1120 Opioid dependence, uncomplicated: Secondary | ICD-10-CM | POA: Diagnosis not present

## 2023-08-18 DIAGNOSIS — M179 Osteoarthritis of knee, unspecified: Secondary | ICD-10-CM | POA: Diagnosis not present

## 2023-08-18 DIAGNOSIS — Z79891 Long term (current) use of opiate analgesic: Secondary | ICD-10-CM | POA: Diagnosis not present

## 2023-08-18 DIAGNOSIS — G894 Chronic pain syndrome: Secondary | ICD-10-CM | POA: Diagnosis not present

## 2023-08-18 DIAGNOSIS — M5135 Other intervertebral disc degeneration, thoracolumbar region: Secondary | ICD-10-CM | POA: Diagnosis not present

## 2023-08-24 ENCOUNTER — Other Ambulatory Visit: Payer: Self-pay | Admitting: Internal Medicine

## 2023-08-26 ENCOUNTER — Ambulatory Visit: Admitting: Family Medicine

## 2023-09-09 ENCOUNTER — Ambulatory Visit: Admitting: Family Medicine

## 2023-09-12 ENCOUNTER — Other Ambulatory Visit: Payer: Self-pay | Admitting: Internal Medicine

## 2023-09-12 DIAGNOSIS — J309 Allergic rhinitis, unspecified: Secondary | ICD-10-CM

## 2023-09-14 ENCOUNTER — Ambulatory Visit (INDEPENDENT_AMBULATORY_CARE_PROVIDER_SITE_OTHER): Payer: Medicare HMO | Admitting: Internal Medicine

## 2023-09-14 ENCOUNTER — Encounter: Payer: Self-pay | Admitting: Internal Medicine

## 2023-09-14 VITALS — BP 100/64 | HR 92 | Temp 98.4°F | Ht 65.5 in | Wt 246.0 lb

## 2023-09-14 DIAGNOSIS — Z6841 Body Mass Index (BMI) 40.0 and over, adult: Secondary | ICD-10-CM

## 2023-09-14 DIAGNOSIS — D631 Anemia in chronic kidney disease: Secondary | ICD-10-CM

## 2023-09-14 DIAGNOSIS — E66813 Obesity, class 3: Secondary | ICD-10-CM | POA: Diagnosis not present

## 2023-09-14 DIAGNOSIS — Z0000 Encounter for general adult medical examination without abnormal findings: Secondary | ICD-10-CM

## 2023-09-14 DIAGNOSIS — E039 Hypothyroidism, unspecified: Secondary | ICD-10-CM | POA: Diagnosis not present

## 2023-09-14 DIAGNOSIS — F4321 Adjustment disorder with depressed mood: Secondary | ICD-10-CM | POA: Diagnosis not present

## 2023-09-14 DIAGNOSIS — I700 Atherosclerosis of aorta: Secondary | ICD-10-CM | POA: Diagnosis not present

## 2023-09-14 DIAGNOSIS — M79641 Pain in right hand: Secondary | ICD-10-CM | POA: Diagnosis not present

## 2023-09-14 DIAGNOSIS — E785 Hyperlipidemia, unspecified: Secondary | ICD-10-CM | POA: Diagnosis not present

## 2023-09-14 DIAGNOSIS — R310 Gross hematuria: Secondary | ICD-10-CM

## 2023-09-14 DIAGNOSIS — Z8249 Family history of ischemic heart disease and other diseases of the circulatory system: Secondary | ICD-10-CM

## 2023-09-14 DIAGNOSIS — M170 Bilateral primary osteoarthritis of knee: Secondary | ICD-10-CM | POA: Diagnosis not present

## 2023-09-14 DIAGNOSIS — M79642 Pain in left hand: Secondary | ICD-10-CM | POA: Diagnosis not present

## 2023-09-14 NOTE — Progress Notes (Signed)
 I,Jameka J Llittleton, CMA,acting as a Neurosurgeon for Catheryn LOISE Slocumb, MD.,have documented all relevant documentation on the behalf of Catheryn LOISE Slocumb, MD,as directed by  Catheryn LOISE Slocumb, MD while in the presence of Catheryn LOISE Slocumb, MD.  Subjective:    Patient ID: Natalie Guerrero , female    DOB: 1965/10/23 , 58 y.o.   MRN: [id number]  Chief Complaint  Patient presents with   Annual Exam    Patient presents today for a physical. Patient is followed by [facility] for her GYN care. Patient reports she saw blood this morning when she wiped. She also complains of pain in her joints in her hand. Patient would like to see a rheumatologist but she would like to wait until her son's rheumatologist opens his own office.      HPI Discussed the use of AI scribe software for clinical note transcription with the patient, who gave verbal consent to proceed.  History of Present Illness Natalie Guerrero is a 58 year old female who presents with blood in her urine.  She noticed hematuria this morning, visible on toilet paper after urination. No vaginal bleeding or dysuria today, but she recalls experiencing dysuria within the last two weeks without associated hematuria. No recent sexual intercourse.  She has a history of anemia and has been taking folic acid  since June of last year. Her blood count improved after adding zinc to her regimen, and she was discharged from her hematologist's care. She now requires blood work to be done elsewhere.  She experiences knee pain and has been referred to a specialist for potential knee replacement. She described her knees as 'bone on bone.' Previous treatments included cortisone and gel injections, which provided temporary relief.  There is a family history of heart disease, with her mother and maternal grandmother affected. A recent CT scan showed aortic plaque, and she is not currently on any cholesterol medication. She has never smoked, despite both  parents having a history of smoking.  She is dealing with emotional stress due to the recent loss of her uncle and the anniversary of her mother's death. She wants grief counseling and discussed her need for support, noting that she does not have support at home.   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   GERD (gastroesophageal reflux disease) 2019   Glaucoma 2023   H/O blood clots    Heart murmur 09-2019   Hyperlipidemia LDL goal <100 09/15/2022   Hypothyroidism 12/08/2012   Iron  deficiency anemia, unspecified 11/10/2012   Iron  malabsorption 01/20/2017   Joint pain    Neuromuscular disorder (HCC) 2012   Since back surgery   Obesity    Thyroid  disease hypothroidism   Vitamin D  deficiency      Family History  Problem Relation Age of Onset   Heart failure Mother    Anxiety disorder Mother    Arthritis Mother    Heart disease Mother    Hypertension Mother    Stroke Mother    Varicose Veins Mother    Depression Mother    Prostate cancer Father    Alcohol abuse Father  Arthritis Father    Cancer Father    COPD Father    Depression Father    Hypertension Father    Obesity Father    Alcohol abuse Brother    Anxiety disorder Brother    Arthritis Brother    Drug abuse Brother    Hypertension Brother    Alcohol abuse Brother    Anxiety disorder Brother    Drug abuse Brother    Early death Brother    Heart disease Brother    Hypertension Brother    Stroke Brother    Anxiety disorder Sister    Obesity Sister    Hypertension Sister    Obesity Brother    Depression Brother    Colon cancer Neg Hx    Diabetes Neg Hx    Kidney disease Neg Hx    Liver disease Neg Hx    Neuropathy Neg  Hx      Current Outpatient Medications:    b complex vitamins capsule, Take 1 capsule by mouth daily., Disp: , Rfl:    benzonatate  (TESSALON  PERLES) 100 MG capsule, Take 1 capsule (100 mg total) by mouth 3 (three) times daily as needed for cough., Disp: 30 capsule, Rfl: 0   brimonidine (ALPHAGAN) 0.2 % ophthalmic solution, Place 1 drop into the right eye 2 (two) times daily., Disp: , Rfl:    buPROPion  (WELLBUTRIN  XL) 300 MG 24 hr tablet, TAKE 1 TABLET (300 MG TOTAL) BY MOUTH EVERY MORNING., Disp: 90 tablet, Rfl: 1   cetirizine (ZYRTEC) 10 MG tablet, Take 10 mg by mouth daily., Disp: , Rfl:    Cholecalciferol (VITAMIN D3) 50 MCG (2000 UT) capsule, Take 2,000 Units by mouth daily., Disp: , Rfl:    Collagen Hydrolysate POWD, by Does not apply route., Disp: , Rfl:    cyclobenzaprine  (FLEXERIL ) 10 MG tablet, Take 1 tablet (10 mg total) by mouth 3 (three) times daily as needed for muscle spasms., Disp: 30 tablet, Rfl: 0   docusate sodium (COLACE) 100 MG capsule, Take 200 mg by mouth as needed for moderate constipation., Disp: , Rfl:    gabapentin  (NEURONTIN ) 300 MG capsule, Take 300 mg by mouth 3 (three) times daily., Disp: , Rfl:    guaiFENesin (MUCINEX) 600 MG 12 hr tablet, Take by mouth 2 (two) times daily., Disp: , Rfl:    ibuprofen  (ADVIL ) 800 MG tablet, Take 800 mg by mouth every 8 (eight) hours as needed., Disp: , Rfl:    imipramine (TOFRANIL) 50 MG tablet, 50 mg 2 (two) times daily., Disp: , Rfl:    levothyroxine  (SYNTHROID ) 100 MCG tablet, TAKE 1 TABLET MON-FRI AND TAKE 1/2 PILL ON SATURDAY/SUNDAY., Disp: 78 tablet, Rfl: 1   Magnesium  200 MG TABS, Take 4 tablets by mouth daily., Disp: , Rfl:    montelukast  (SINGULAIR ) 10 MG tablet, TAKE 1 TABLET BY MOUTH EVERY DAY, Disp: 90 tablet, Rfl: 1   naloxone (NARCAN) nasal spray 4 mg/0.1 mL, See admin instructions., Disp: , Rfl:    NOREL AD 4-10-325 MG TABS, TAKE 1 TABLET BY MOUTH TWICE A DAY AS NEEDED, Disp: 20 tablet, Rfl: 0   omeprazole   (PRILOSEC) 40 MG capsule, TAKE 1 CAPSULE BY MOUTH EVERY DAY, Disp: 90 capsule, Rfl: 1   OVER THE COUNTER MEDICATION, Tea made with ginger root and tumeric root., Disp: , Rfl:    Oxycodone  HCl 20 MG TABS, SMARTSIG:0.5-1 Tablet(s) By Mouth 4-6 Times Daily, Disp: , Rfl:    RESTASIS 0.05 % ophthalmic emulsion, Place 1 drop into  both eyes as needed. Dry eyes, Disp: , Rfl:    Sod Picosulfate-Mag Ox-Cit Acd (CLENPIQ) 10-3.5-12 MG-GM -GM/175ML SOLN, 175 ML ORALLY TWICE for 2, Disp: , Rfl:    tiZANidine (ZANAFLEX) 4 MG tablet, every 4 (four) hours as needed., Disp: , Rfl:    topiramate (TOPAMAX) 100 MG tablet, Take 100 mg by mouth 4 (four) times daily., Disp: , Rfl:    Zinc Citrate (ZINC GUMMY PO), Take by mouth daily., Disp: , Rfl:   Current Facility-Administered Medications:    triamcinolone  acetonide (KENALOG ) 10 MG/ML injection 20 mg, 20 mg, Other, Once, [place], [place] RAMAN, DPM   Allergies  Allergen Reactions   Codeine Itching      The patient states she uses status post hysterectomy for birth control. No LMP recorded. Patient has had a hysterectomy.. Negative for Dysmenorrhea. Negative for: breast discharge, breast lump(s), breast pain and breast self exam. Associated symptoms include abnormal vaginal bleeding. Pertinent negatives include abnormal bleeding (hematology), anxiety, decreased libido, depression, difficulty falling sleep, dyspareunia, history of infertility, nocturia, sexual dysfunction, sleep disturbances, urinary incontinence, urinary urgency, vaginal discharge and vaginal itching. Diet regular.The patient states her exercise level is  intermittent.  . The patient's tobacco use is:  Social History   Tobacco Use  Smoking Status Never  Smokeless Tobacco Never  Tobacco Comments   NA  . She has been exposed to passive smoke. The patient's alcohol use is:  Social History   Substance and Sexual Activity  Alcohol Use Not Currently  .    Review of Systems  Constitutional: Negative.    HENT: Negative.    Eyes: Negative.   Respiratory: Negative.    Cardiovascular: Negative.   Gastrointestinal: Negative.   Endocrine: Negative.   Genitourinary: Negative.   Musculoskeletal:  Positive for arthralgias.  Skin: Negative.   Allergic/Immunologic: Negative.   Neurological: Negative.   Hematological: Negative.   Psychiatric/Behavioral: Negative.       Today's Vitals   09/14/23 1007  BP: 100/64  Pulse: 92  Temp: 98.4 F (36.9 C)  TempSrc: Oral  Weight: 246 lb (111.6 kg)  Height: 5' 5.5 (1.664 m)  PainSc: 5   PainLoc: Hand   Body mass index is 40.31 kg/m.  Wt Readings from Last 3 Encounters:  09/17/23 243 lb (110.2 kg)  09/14/23 246 lb (111.6 kg)  07/29/23 241 lb (109.3 kg)     Objective:  Physical Exam Constitutional:      Appearance: Normal appearance. She is obese.  HENT:     Head: Normocephalic and atraumatic.     Right Ear: Tympanic membrane, ear canal and external ear normal. There is no impacted cerumen.     Left Ear: Tympanic membrane, ear canal and external ear normal. There is no impacted cerumen.     Mouth/Throat:     Pharynx: No oropharyngeal exudate or posterior oropharyngeal erythema.  Eyes:     Extraocular Movements: Extraocular movements intact.     Pupils: Pupils are equal, round, and reactive to light.  Cardiovascular:     Rate and Rhythm: Normal rate and regular rhythm.     Heart sounds: Normal heart sounds.  Pulmonary:     Effort: Pulmonary effort is normal.     Breath sounds: Normal breath sounds.  Abdominal:     General: Bowel sounds are normal.     Palpations: Abdomen is soft.  Genitourinary:    Comments: Deferred  Musculoskeletal:     Cervical back: Normal range of motion.     Comments:  Patient has lymphedema on her right leg  Skin:    General: Skin is warm and dry.  Neurological:     General: No focal deficit present.     Mental Status: She is alert and oriented to person, place, and time.  Psychiatric:        Mood  and Affect: Mood normal.        Behavior: Behavior normal.         Assessment And Plan:     Encounter for general adult medical examination w/o abnormal findings Assessment & Plan: A full exam was performed.  Importance of monthly self breast exams was discussed with the patient.  She is advised to get 30-45 minutes of regular exercise, no less than four to five days per week. Both weight-bearing and aerobic exercises are recommended.  She is advised to follow a healthy diet with at least six fruits/veggies per day, decrease intake of red meat and other saturated fats and to increase fish intake to twice weekly.  Meats/fish should not be fried -- baked, boiled or broiled is preferable. It is also important to cut back on your sugar intake.  Be sure to read labels - try to avoid anything with added sugar, high fructose corn syrup or other sweeteners.  If you must use a sweetener, you can try stevia or monkfruit.  It is also important to avoid artificially sweetened foods/beverages and diet drinks. Lastly, wear SPF 50 sunscreen on exposed skin and when in direct sunlight for an extended period of time.  Be sure to avoid fast food restaurants and aim for at least 60 ounces of water daily.       Aortic atherosclerosis [facility]) Assessment & Plan: Incidental finding on CT performed on 07/10/23. Current LDL 80 mg/dL, goal LDL <29.  She does report family history of heart disease. Discussed cholesterol management and concerns about heart disease. - Discuss potential cholesterol medication, considering side effects. - Encourage dietary modifications with increased fiber.  Orders: -     CMP14+EGFR -     Lipid panel  Primary hypothyroidism Assessment & Plan: Chronic, currently on Synthroid  100mcg daily M-F and 1/2 tab on Saturday/Sundays. I will check thyroid  panel and adjust meds as needed.   Orders: -     TSH + free T4  Hyperlipidemia LDL goal <100 Assessment & Plan: Chronic, not yet on statin  therapy.  I will check lipoprotein (a) level today. Will consider statin therapy if Lp(a) is elevated.  - Of note, cardiac calcium score is zero.    Erythropoietin  deficiency anemia Assessment & Plan: Pt states she has received a letter from Hematology stating her labs need to be drawn here from now on. Previously managed by hematologist. Folic acid  and zinc improved blood counts. Recent profile showed improvement. - Continue folic acid  and zinc supplementation. - Monitor blood counts as needed.   Orders: -     CBC -     Iron , TIBC and Ferritin Panel  Gross hematuria Assessment & Plan: Blood in urine without pain or burning. - Send urine sample for culture.  Orders: -     Urine Culture  Bilateral hand pain -     ANA, IFA (with reflex) -     CYCLIC CITRUL PEPTIDE ANTIBODY, IGG/IGA -     Rheumatoid factor -     Sedimentation rate -     Uric acid  Bilateral primary osteoarthritis of knee Assessment & Plan: Chronic knee pain with bone on bone condition.  Previous injections provided temporary relief. Referred to orthopedic specialist. - Follow up with orthopedic specialist on August 20. - Discuss potential for knee injections or other interventions with specialist.   Grief Assessment & Plan: Emotional challenges related to anniversary of mother's death and recent loss of uncle. Discussed stages of grief and counseling benefits. - Explore virtual grief counseling options. - Consider hospice resources for free counseling.   Class 3 severe obesity due to excess calories with serious comorbidity and body mass index (BMI) of 40.0 to 44.9 in adult Assessment & Plan: BMI 39. HWW input is appreciated. She is encouraged to strive to lose ten percent of her body weight to decrease cardiac risk.    Family history of heart disease -     Lipoprotein A (LPA)    Return for 1 year physical. Patient was given opportunity to ask questions. Patient verbalized understanding of the plan  and was able to repeat key elements of the plan. All questions were answered to their satisfaction.   I, Catheryn LOISE Slocumb, MD, have reviewed all documentation for this visit. The documentation on 09/14/23 for the exam, diagnosis, procedures, and orders are all accurate and complete.

## 2023-09-14 NOTE — Patient Instructions (Signed)

## 2023-09-14 NOTE — Assessment & Plan Note (Signed)

## 2023-09-14 NOTE — Assessment & Plan Note (Addendum)
 Pt states she has received a letter from Hematology stating her labs need to be drawn here from now on. Previously managed by hematologist. Folic acid  and zinc improved blood counts. Recent profile showed improvement. - Continue folic acid  and zinc supplementation. - Monitor blood counts as needed.

## 2023-09-14 NOTE — Assessment & Plan Note (Addendum)
 Incidental finding on CT performed on 07/10/23. Current LDL 80 mg/dL, goal LDL <29.  She does report family history of heart disease. Discussed cholesterol management and concerns about heart disease. - Discuss potential cholesterol medication, considering side effects. - Encourage dietary modifications with increased fiber.

## 2023-09-14 NOTE — Assessment & Plan Note (Addendum)
 BMI 39. HWW input is appreciated. She is encouraged to strive to lose ten percent of her body weight to decrease cardiac risk.

## 2023-09-15 ENCOUNTER — Encounter: Payer: Self-pay | Admitting: Internal Medicine

## 2023-09-15 ENCOUNTER — Ambulatory Visit: Payer: Self-pay | Admitting: Internal Medicine

## 2023-09-15 DIAGNOSIS — M5135 Other intervertebral disc degeneration, thoracolumbar region: Secondary | ICD-10-CM | POA: Diagnosis not present

## 2023-09-15 DIAGNOSIS — R202 Paresthesia of skin: Secondary | ICD-10-CM | POA: Diagnosis not present

## 2023-09-15 DIAGNOSIS — F1120 Opioid dependence, uncomplicated: Secondary | ICD-10-CM | POA: Diagnosis not present

## 2023-09-15 DIAGNOSIS — M179 Osteoarthritis of knee, unspecified: Secondary | ICD-10-CM | POA: Diagnosis not present

## 2023-09-15 DIAGNOSIS — M62830 Muscle spasm of back: Secondary | ICD-10-CM | POA: Diagnosis not present

## 2023-09-15 DIAGNOSIS — G894 Chronic pain syndrome: Secondary | ICD-10-CM | POA: Diagnosis not present

## 2023-09-15 LAB — IRON,TIBC AND FERRITIN PANEL
Ferritin: 18 ng/mL (ref 15–150)
Iron Saturation: 17 % (ref 15–55)
Iron: 52 ug/dL (ref 27–159)
Total Iron Binding Capacity: 303 ug/dL (ref 250–450)
UIBC: 251 ug/dL (ref 131–425)

## 2023-09-16 LAB — LIPID PANEL
Chol/HDL Ratio: 2.4 ratio (ref 0.0–4.4)
Cholesterol, Total: 161 mg/dL (ref 100–199)
HDL: 67 mg/dL (ref >39)
LDL Chol Calc (NIH): 80 mg/dL (ref 0–99)
Triglycerides: 74 mg/dL (ref 0–149)
VLDL Cholesterol Cal: 14 mg/dL (ref 5–40)

## 2023-09-16 LAB — CMP14+EGFR
ALT: 29 IU/L (ref 0–32)
AST: 31 IU/L (ref 0–40)
Albumin: 4.2 g/dL (ref 3.8–4.9)
Alkaline Phosphatase: 97 IU/L (ref 44–121)
BUN/Creatinine Ratio: 21 (ref 9–23)
BUN: 20 mg/dL (ref 6–24)
Bilirubin Total: 0.2 mg/dL (ref 0.0–1.2)
CO2: 20 mmol/L (ref 20–29)
Calcium: 9.1 mg/dL (ref 8.7–10.2)
Chloride: 107 mmol/L — ABNORMAL HIGH (ref 96–106)
Creatinine, Ser: 0.95 mg/dL (ref 0.57–1.00)
Globulin, Total: 2.7 g/dL (ref 1.5–4.5)
Glucose: 94 mg/dL (ref 70–99)
Potassium: 4.9 mmol/L (ref 3.5–5.2)
Sodium: 140 mmol/L (ref 134–144)
Total Protein: 6.9 g/dL (ref 6.0–8.5)
eGFR: 69 mL/min/1.73 (ref >59)

## 2023-09-16 LAB — CBC
Hematocrit: 35.5 % (ref 34.0–46.6)
Hemoglobin: 11.1 g/dL (ref 11.1–15.9)
MCH: 32.3 pg (ref 26.6–33.0)
MCHC: 31.3 g/dL — ABNORMAL LOW (ref 31.5–35.7)
MCV: 103 fL — ABNORMAL HIGH (ref 79–97)
Platelets: 366 x10E3/uL (ref 150–450)
RBC: 3.44 x10E6/uL — ABNORMAL LOW (ref 3.77–5.28)
RDW: 12.4 % (ref 11.7–15.4)
WBC: 4.3 x10E3/uL (ref 3.4–10.8)

## 2023-09-16 LAB — TSH+FREE T4
Free T4: 0.93 ng/dL (ref 0.82–1.77)
TSH: 0.541 u[IU]/mL (ref 0.450–4.500)

## 2023-09-16 LAB — URIC ACID: Uric Acid: 2.6 mg/dL — ABNORMAL LOW (ref 3.0–7.2)

## 2023-09-16 LAB — CYCLIC CITRUL PEPTIDE ANTIBODY, IGG/IGA: Cyclic Citrullin Peptide Ab: 4 U (ref 0–19)

## 2023-09-16 LAB — ANTINUCLEAR ANTIBODIES, IFA: ANA Titer 1: NEGATIVE

## 2023-09-16 LAB — RHEUMATOID FACTOR: Rheumatoid fact SerPl-aCnc: <10 [IU]/mL (ref <14.0)

## 2023-09-16 LAB — SEDIMENTATION RATE: Sed Rate: 13 mm/h (ref 0–40)

## 2023-09-16 LAB — LIPOPROTEIN A (LPA): Lipoprotein (a): 147 nmol/L — ABNORMAL HIGH (ref <75.0)

## 2023-09-17 ENCOUNTER — Ambulatory Visit: Admitting: Family Medicine

## 2023-09-17 ENCOUNTER — Encounter: Payer: Self-pay | Admitting: Family Medicine

## 2023-09-17 VITALS — BP 112/79 | HR 84 | Temp 98.1°F | Ht 65.5 in | Wt 243.0 lb

## 2023-09-17 DIAGNOSIS — Z6839 Body mass index (BMI) 39.0-39.9, adult: Secondary | ICD-10-CM

## 2023-09-17 DIAGNOSIS — G894 Chronic pain syndrome: Secondary | ICD-10-CM

## 2023-09-17 DIAGNOSIS — E66812 Obesity, class 2: Secondary | ICD-10-CM | POA: Diagnosis not present

## 2023-09-17 DIAGNOSIS — K909 Intestinal malabsorption, unspecified: Secondary | ICD-10-CM

## 2023-09-17 DIAGNOSIS — Z9884 Bariatric surgery status: Secondary | ICD-10-CM | POA: Diagnosis not present

## 2023-09-17 LAB — URINE CULTURE

## 2023-09-17 NOTE — Patient Instructions (Signed)
 Resume a higher protein/ lower carb diet  Trade out your regular Multivitamin for a Bariatric Multivitamin (with IRON  in it)  Continue Physical therapy  Hydrate well with water/ sugar free drinks 30 min outside of mealtime  You could be taking a Calcium supplement daily and vitamin D  3,000 international units daily
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
## 2023-09-19 DIAGNOSIS — R310 Gross hematuria: Secondary | ICD-10-CM | POA: Insufficient documentation

## 2023-09-19 DIAGNOSIS — F4321 Adjustment disorder with depressed mood: Secondary | ICD-10-CM | POA: Insufficient documentation

## 2023-09-19 DIAGNOSIS — M170 Bilateral primary osteoarthritis of knee: Secondary | ICD-10-CM | POA: Insufficient documentation

## 2023-09-19 NOTE — Assessment & Plan Note (Signed)
 Chronic knee pain with bone on bone condition. Previous injections provided temporary relief. Referred to orthopedic specialist. - Follow up with orthopedic specialist on August 20. - Discuss potential for knee injections or other interventions with specialist.

## 2023-09-19 NOTE — Assessment & Plan Note (Signed)
 Emotional challenges related to anniversary of mother's death and recent loss of uncle. Discussed stages of grief and counseling benefits. - Explore virtual grief counseling options. - Consider hospice resources for free counseling.

## 2023-09-19 NOTE — Assessment & Plan Note (Addendum)
 Chronic, not yet on statin therapy.  I will check lipoprotein (a) level today. Will consider statin therapy if Lp(a) is elevated.  - Of note, cardiac calcium score is zero.

## 2023-09-19 NOTE — Assessment & Plan Note (Signed)
Chronic, currently on Synthroid daily M-F and 1/2 tab on Saturday/Sundays. I will check thyroid panel and adjust meds as needed.

## 2023-09-19 NOTE — Assessment & Plan Note (Signed)
 Blood in urine without pain or burning. - Send urine sample for culture.

## 2023-09-21 MED ORDER — NITROFURANTOIN MONOHYD MACRO 100 MG PO CAPS: 100.0000 mg | ORAL_CAPSULE | Freq: Two times a day (BID) | ORAL | 0 refills | Status: AC

## 2023-09-24 ENCOUNTER — Encounter: Payer: Self-pay | Admitting: Internal Medicine

## 2023-09-30 ENCOUNTER — Other Ambulatory Visit (INDEPENDENT_AMBULATORY_CARE_PROVIDER_SITE_OTHER): Payer: Self-pay

## 2023-09-30 ENCOUNTER — Ambulatory Visit (INDEPENDENT_AMBULATORY_CARE_PROVIDER_SITE_OTHER): Admitting: Orthopedic Surgery

## 2023-09-30 ENCOUNTER — Encounter: Payer: Self-pay | Admitting: Orthopedic Surgery

## 2023-09-30 VITALS — Ht 65.0 in | Wt 243.0 lb

## 2023-09-30 DIAGNOSIS — M25562 Pain in left knee: Secondary | ICD-10-CM | POA: Diagnosis not present

## 2023-09-30 DIAGNOSIS — M25561 Pain in right knee: Secondary | ICD-10-CM
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
## 2023-10-02 ENCOUNTER — Encounter: Payer: Self-pay | Admitting: Orthopedic Surgery

## 2023-10-06 DIAGNOSIS — H40012 Open angle with borderline findings, low risk, left eye: Secondary | ICD-10-CM | POA: Diagnosis not present

## 2023-10-06 DIAGNOSIS — H16223 Keratoconjunctivitis sicca, not specified as Sjogren's, bilateral: Secondary | ICD-10-CM | POA: Diagnosis not present

## 2023-10-06 DIAGNOSIS — H401131 Primary open-angle glaucoma, bilateral, mild stage: Secondary | ICD-10-CM | POA: Diagnosis not present

## 2023-10-07 ENCOUNTER — Other Ambulatory Visit: Payer: Self-pay

## 2023-10-07 DIAGNOSIS — M25562 Pain in left knee: Secondary | ICD-10-CM

## 2023-10-07 NOTE — Telephone Encounter (Signed)
 Sure thx

## 2023-10-16 DIAGNOSIS — G894 Chronic pain syndrome: Secondary | ICD-10-CM | POA: Diagnosis not present

## 2023-10-16 DIAGNOSIS — R202 Paresthesia of skin: Secondary | ICD-10-CM | POA: Diagnosis not present

## 2023-10-16 DIAGNOSIS — M62830 Muscle spasm of back: Secondary | ICD-10-CM | POA: Diagnosis not present

## 2023-10-16 DIAGNOSIS — M5135 Other intervertebral disc degeneration, thoracolumbar region: Secondary | ICD-10-CM | POA: Diagnosis not present

## 2023-10-16 DIAGNOSIS — Z79891 Long term (current) use of opiate analgesic: Secondary | ICD-10-CM | POA: Diagnosis not present

## 2023-10-16 DIAGNOSIS — M5430 Sciatica, unspecified side: Secondary | ICD-10-CM | POA: Diagnosis not present

## 2023-10-16 DIAGNOSIS — M179 Osteoarthritis of knee, unspecified: Secondary | ICD-10-CM | POA: Diagnosis not present

## 2023-10-16 DIAGNOSIS — F1120 Opioid dependence, uncomplicated: Secondary | ICD-10-CM | POA: Diagnosis not present

## 2023-10-16 DIAGNOSIS — M25519 Pain in unspecified shoulder: Secondary | ICD-10-CM | POA: Diagnosis not present

## 2023-10-19 ENCOUNTER — Ambulatory Visit: Admitting: Family Medicine

## 2023-10-22 ENCOUNTER — Ambulatory Visit: Admitting: Family Medicine

## 2023-10-22 ENCOUNTER — Encounter: Payer: Self-pay | Admitting: Family Medicine

## 2023-10-22 VITALS — BP 101/69 | HR 83 | Temp 98.5°F | Ht 65.5 in | Wt 236.0 lb

## 2023-10-22 DIAGNOSIS — Z9884 Bariatric surgery status: Secondary | ICD-10-CM

## 2023-10-22 DIAGNOSIS — E7841 Elevated Lipoprotein(a): Secondary | ICD-10-CM | POA: Diagnosis not present

## 2023-10-22 DIAGNOSIS — E66812 Obesity, class 2: Secondary | ICD-10-CM

## 2023-10-22 DIAGNOSIS — Z6838 Body mass index (BMI) 38.0-38.9, adult: Secondary | ICD-10-CM

## 2023-10-22 DIAGNOSIS — G894 Chronic pain syndrome: Secondary | ICD-10-CM | POA: Diagnosis not present

## 2023-10-22 DIAGNOSIS — E6609 Other obesity due to excess calories: Secondary | ICD-10-CM
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
## 2023-10-22 NOTE — Patient Instructions (Addendum)
 Why does Lp(a) increase my risk of heart disease? High Lp(a) numbers of 50 mg/dL (874 nmols/L) or higher promote clotting and inflammation, significantly increasing risk of heart attack, stroke, aortic stenosis and peripheral artery disease. This is especially true for those with coronary heart disease or familial hypercholesterolemia, or FH, an inherited condition that affects the body's ability to process LDL "bad" cholesterol.  Lp(a) can accumulate in the walls of blood vessels, forming plaques similarly to LDL cholesterol. These plaques can block blood flow to vital organs such as the heart, brain, kidneys, lungs, and other parts of the body, leading to conditions like heart attacks, strokes, and other cardiovascular diseases.  Recent preliminary studies have determined a possible connection between high Lp(a) numbers and the risk of calcific aortic valve disease (CAVD). High Lp(a) numbers increase inflammation and calcification in heart cells, leading to calcium buildup in the aortic valve.   How do I lower my Lp(a)? While lifestyle changes don't affect your Lp(a) numbers, doctors recommend people with high Lp(a) stay active, eat a healthy diet, get enough sleep, avoid smoking, and maintain a healthy body mass index, or BMI.  "Life's Essential 8 are the key measures for improving and maintaining cardiovascular health (as defined by the American Heart Association)," Reyes-Soffer said.  It's also important to stay on top of your cholesterol, said Reyes-Soffer, noting that having high numbers of LDL and Lp(a) may further increase your risk of heart disease.  Lipoprotein apheresis is a recommended treatment option for eligible familial hypercholesterolemia patients with high numbers of both LDL and Lp(a). This procedure involves filtering out both Lp(a) and LDL, temporarily lowering their numbers. Ask your health care professional about appropriate treatment options for you.  Your Lipoprotein A was  147 (<75 is normal )

## 2023-10-29 ENCOUNTER — Ambulatory Visit: Admitting: Orthopedic Surgery

## 2023-10-29 DIAGNOSIS — M170 Bilateral primary osteoarthritis of knee: Secondary | ICD-10-CM | POA: Diagnosis not present

## 2023-10-29 NOTE — Progress Notes (Signed)
   Procedure Note  Patient: Natalie Guerrero             Date of Birth: [birth year]           MRN: [id number]             Visit Date: 10/29/2023  Procedures: Visit Diagnoses:  1. Primary osteoarthritis of both knees     Large Joint Inj: bilateral knee on 10/29/2023 5:50 PM Indications: diagnostic evaluation, joint swelling and pain Details: 18 G 1.5 in needle, superolateral approach  Arthrogram: No  Medications (Right): 5 mL lidocaine  1 %; 60 mg Sodium Hyaluronate 60 MG/3ML Medications (Left): 5 mL lidocaine  1 %; 60 mg Sodium Hyaluronate 60 MG/3ML Outcome: tolerated well, no immediate complications Procedure, treatment alternatives, risks and benefits explained, specific risks discussed. Consent was given by the patient. Immediately prior to procedure a time out was called to verify the correct patient, procedure, equipment, support staff and site/side marked as required. Patient was prepped and draped in the usual sterile fashion.      Lot #[id number]

## 2023-10-31 ENCOUNTER — Encounter: Payer: Self-pay | Admitting: Orthopedic Surgery

## 2023-10-31 MED ORDER — LIDOCAINE HCL 1 % IJ SOLN
5.0000 mL | INTRAMUSCULAR | Status: AC | PRN
  Administered 2023-10-29: 5 mL

## 2023-10-31 MED ORDER — SODIUM HYALURONATE 60 MG/3ML IX PRSY
60.0000 mg | PREFILLED_SYRINGE | INTRA_ARTICULAR | Status: AC | PRN
  Administered 2023-10-29: 60 mg via INTRA_ARTICULAR

## 2023-11-02 ENCOUNTER — Encounter: Payer: Self-pay | Admitting: Orthopedic Surgery

## 2023-11-03 ENCOUNTER — Telehealth: Payer: Self-pay | Admitting: Orthopedic Surgery

## 2023-11-03 NOTE — Telephone Encounter (Signed)
 Patient had sent mychart message as well, and I did respond to it.

## 2023-11-03 NOTE — Telephone Encounter (Signed)
 Patient called and said that the injection isn't hurting her more. She would like if you could give her a call. CB#[phone]

## 2023-11-04 ENCOUNTER — Encounter: Payer: Self-pay | Admitting: Family

## 2023-11-04 ENCOUNTER — Ambulatory Visit: Admitting: Orthopedic Surgery

## 2023-11-04 DIAGNOSIS — M170 Bilateral primary osteoarthritis of knee: Secondary | ICD-10-CM | POA: Diagnosis not present

## 2023-11-07 ENCOUNTER — Encounter: Payer: Self-pay | Admitting: Orthopedic Surgery
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
## 2023-11-16 DIAGNOSIS — M179 Osteoarthritis of knee, unspecified: Secondary | ICD-10-CM | POA: Diagnosis not present

## 2023-11-16 DIAGNOSIS — M25519 Pain in unspecified shoulder: Secondary | ICD-10-CM | POA: Diagnosis not present

## 2023-11-16 DIAGNOSIS — R202 Paresthesia of skin: Secondary | ICD-10-CM | POA: Diagnosis not present

## 2023-11-16 DIAGNOSIS — M62830 Muscle spasm of back: Secondary | ICD-10-CM | POA: Diagnosis not present

## 2023-11-16 DIAGNOSIS — M5115 Intervertebral disc disorders with radiculopathy, thoracolumbar region: Secondary | ICD-10-CM | POA: Diagnosis not present

## 2023-11-16 DIAGNOSIS — M5430 Sciatica, unspecified side: Secondary | ICD-10-CM | POA: Diagnosis not present

## 2023-11-16 DIAGNOSIS — Z79891 Long term (current) use of opiate analgesic: Secondary | ICD-10-CM | POA: Diagnosis not present

## 2023-11-16 DIAGNOSIS — G894 Chronic pain syndrome: Secondary | ICD-10-CM | POA: Diagnosis not present

## 2023-11-18 ENCOUNTER — Other Ambulatory Visit: Payer: Self-pay | Admitting: Internal Medicine

## 2023-11-19 ENCOUNTER — Ambulatory Visit: Admitting: Family Medicine

## 2023-11-25 ENCOUNTER — Other Ambulatory Visit: Payer: Self-pay | Admitting: Internal Medicine

## 2023-11-25 DIAGNOSIS — Z1231 Encounter for screening mammogram for malignant neoplasm of breast: Secondary | ICD-10-CM

## 2023-11-27 DIAGNOSIS — M533 Sacrococcygeal disorders, not elsewhere classified: Secondary | ICD-10-CM | POA: Diagnosis not present

## 2023-12-08 ENCOUNTER — Inpatient Hospital Stay: Admission: RE | Admit: 2023-12-08 | Source: Ambulatory Visit

## 2023-12-09 DIAGNOSIS — M533 Sacrococcygeal disorders, not elsewhere classified: Secondary | ICD-10-CM | POA: Diagnosis not present

## 2023-12-14 ENCOUNTER — Encounter: Payer: Self-pay | Admitting: Radiology

## 2023-12-14 DIAGNOSIS — M25519 Pain in unspecified shoulder: Secondary | ICD-10-CM | POA: Diagnosis not present

## 2023-12-14 DIAGNOSIS — R202 Paresthesia of skin: Secondary | ICD-10-CM | POA: Diagnosis not present

## 2023-12-14 DIAGNOSIS — M179 Osteoarthritis of knee, unspecified: Secondary | ICD-10-CM | POA: Diagnosis not present

## 2023-12-14 DIAGNOSIS — Z79899 Other long term (current) drug therapy: Secondary | ICD-10-CM | POA: Diagnosis not present

## 2023-12-14 DIAGNOSIS — M5430 Sciatica, unspecified side: Secondary | ICD-10-CM | POA: Diagnosis not present

## 2023-12-14 DIAGNOSIS — M5115 Intervertebral disc disorders with radiculopathy, thoracolumbar region: Secondary | ICD-10-CM | POA: Diagnosis not present

## 2023-12-14 DIAGNOSIS — G894 Chronic pain syndrome: Secondary | ICD-10-CM | POA: Diagnosis not present

## 2023-12-14 DIAGNOSIS — M62830 Muscle spasm of back: Secondary | ICD-10-CM | POA: Diagnosis not present

## 2023-12-24 ENCOUNTER — Encounter: Payer: Self-pay | Admitting: Internal Medicine

## 2023-12-30 ENCOUNTER — Ambulatory Visit
Admission: RE | Admit: 2023-12-30 | Discharge: 2023-12-30 | Disposition: A | Discharge status: Home / Self Care | Source: Ambulatory Visit | Attending: Internal Medicine | Admitting: Internal Medicine

## 2023-12-30 DIAGNOSIS — Z1231 Encounter for screening mammogram for malignant neoplasm of breast: Secondary | ICD-10-CM

## 2024-01-11 DIAGNOSIS — G894 Chronic pain syndrome: Secondary | ICD-10-CM | POA: Diagnosis not present

## 2024-01-11 DIAGNOSIS — Z79891 Long term (current) use of opiate analgesic: Secondary | ICD-10-CM | POA: Diagnosis not present

## 2024-01-11 DIAGNOSIS — M179 Osteoarthritis of knee, unspecified: Secondary | ICD-10-CM | POA: Diagnosis not present

## 2024-01-11 DIAGNOSIS — M62830 Muscle spasm of back: Secondary | ICD-10-CM | POA: Diagnosis not present

## 2024-01-11 DIAGNOSIS — M5430 Sciatica, unspecified side: Secondary | ICD-10-CM | POA: Diagnosis not present

## 2024-01-11 DIAGNOSIS — M9904 Segmental and somatic dysfunction of sacral region: Secondary | ICD-10-CM | POA: Diagnosis not present

## 2024-01-11 DIAGNOSIS — M25519 Pain in unspecified shoulder: Secondary | ICD-10-CM | POA: Diagnosis not present

## 2024-01-11 DIAGNOSIS — M5115 Intervertebral disc disorders with radiculopathy, thoracolumbar region: Secondary | ICD-10-CM | POA: Diagnosis not present

## 2024-01-11 DIAGNOSIS — R202 Paresthesia of skin: Secondary | ICD-10-CM | POA: Diagnosis not present

## 2024-01-27 ENCOUNTER — Encounter: Payer: Self-pay | Admitting: Family Medicine

## 2024-01-27 ENCOUNTER — Encounter: Payer: Self-pay | Admitting: Family

## 2024-01-28 ENCOUNTER — Encounter: Payer: Self-pay | Admitting: Family Medicine

## 2024-01-28 ENCOUNTER — Ambulatory Visit: Admitting: Family Medicine

## 2024-01-28 VITALS — BP 120/64 | HR 87 | Temp 98.2°F

## 2024-01-28 DIAGNOSIS — E66813 Obesity, class 3: Secondary | ICD-10-CM

## 2024-01-28 DIAGNOSIS — M5416 Radiculopathy, lumbar region: Secondary | ICD-10-CM

## 2024-01-28 DIAGNOSIS — N920 Excessive and frequent menstruation with regular cycle: Secondary | ICD-10-CM | POA: Diagnosis not present

## 2024-01-28 DIAGNOSIS — R82998 Other abnormal findings in urine: Secondary | ICD-10-CM | POA: Diagnosis not present

## 2024-01-28 DIAGNOSIS — Z6841 Body Mass Index (BMI) 40.0 and over, adult: Secondary | ICD-10-CM | POA: Diagnosis not present

## 2024-01-28 DIAGNOSIS — R300 Dysuria: Secondary | ICD-10-CM

## 2024-01-28 LAB — POCT URINALYSIS DIP (CLINITEK)
Bilirubin, UA: NEGATIVE
Glucose, UA: NEGATIVE mg/dL
Ketones, POC UA: NEGATIVE mg/dL
Nitrite, UA: NEGATIVE
POC PROTEIN,UA: NEGATIVE
Spec Grav, UA: 1.01 (ref 1.010–1.025)
Urobilinogen, UA: 0.2 U/dL
pH, UA: 7 (ref 5.0–8.0)

## 2024-01-28 MED ADMIN — Triamcinolone Acetonide Inj Susp 40 MG/ML: 40 mg | INTRAMUSCULAR | @ 12:00:00 | NDC 70121116901

## 2024-01-28 NOTE — Progress Notes (Signed)
 I,Natalie Guerrero, CMA,acting as a neurosurgeon for [employer], NP.,have documented all relevant documentation on the behalf of Natalie Creighton, NP,as directed by  Natalie Creighton, NP while in the presence of Natalie Creighton, NP.  Subjective:  Patient ID: Natalie Guerrero , female    DOB: 1965/06/27 , 58 y.o.   MRN: [id number]  Chief Complaint  Patient presents with   Dysuria    Patient presents today for a dysuria. Patient reports a week ago she saw some blood and it has been recurrent. Patient reports she notices the blood after intercourse. She isnt sure if it is the lube they are using.    HPI Discussed the use of AI scribe software for clinical note transcription with the patient, who gave verbal consent to proceed.  History of Present Illness    Natalie Guerrero is a 58 year old female who presents with possible UTI and increased back pain.  She experiences increased back pain that is not alleviated by her current medication, oxycodone , which she takes for chronic lower back pain. She reports that seasonal changes sometimes increase her pain, and that her pelvic area is particularly affected when walking. She rates the pain as an eight out of ten in severity.  Approximately two weeks ago, she noticed bright red blood when wiping, which was unusual since she had a hysterectomy over twenty years ago, leaving her with only her left ovary. She has not experienced any further significant bleeding but did notice a slight pink tinge when wiping two days ago. No blood has been observed in her urine.  Her urine test showed trace leukocytes but no nitrites. She has not been on any hormone replacement therapy since her hysterectomy.  She has a history of back surgery in 2012, which worsened her condition, leading to increased nerve pain and requiring her to take three different nerve pain medications. She regrets having the surgery as it has limited her back motion and increased her  pain.  She is currently under the care of a pain specialist, Dr. Daune, who is now located in [place]. She has a prescription for a prednisone  step-down pack for flare-ups, which she has not used in the past four months.      Natalie Guerrero is a 58 year old female who presents with possible UTI and increased back pain.  She experiences increased back pain that is not alleviated by her current medication, oxycodone , which she takes for chronic lower back pain. She reports that seasonal changes sometimes increase her pain, and that her pelvic area is particularly affected when walking. She rates the pain as an eight out of ten in severity.  Approximately two weeks ago, she noticed bright red blood when wiping, which was unusual since she had a hysterectomy over twenty years ago, leaving her with only her left ovary. She has not experienced any further significant bleeding but did notice a slight pink tinge when wiping two days ago. No blood has been observed in her urine.  Her urine test showed trace leukocytes but no nitrites. She has not been on any hormone replacement therapy since her hysterectomy.  She has a history of back surgery in 2012, which worsened her condition, leading to increased nerve pain and requiring her to take three different nerve pain medications. She regrets having the surgery as it has limited her back motion and increased her pain.  She is currently under the care of a pain specialist, Dr. Daune, who  is now located in [place]. She has a prescription for a prednisone  step-down pack for flare-ups, which she has not used in the past four months.          Past Medical History:  Diagnosis Date   Allergy 1985   Anemia    Anxiety    Arthritis    Back pain    Cataract 2024   Constipation    DDD (degenerative disc disease)    Depression    Edema    Encounter for general adult medical examination w/o abnormal findings 09/10/2022    Erythropoietin  deficiency anemia 08/07/2022   GERD (gastroesophageal reflux disease) 2019   Glaucoma 2023   H/O blood clots    Heart murmur 09-2019   Hyperlipidemia LDL goal <100 09/15/2022   Hypothyroidism 12/08/2012   Iron  deficiency anemia, unspecified 11/10/2012   Iron  malabsorption 01/20/2017   Joint pain    Neuromuscular disorder (HCC) 2012   Since back surgery   Obesity    Thyroid  disease hypothroidism   Vitamin D  deficiency      Family History  Problem Relation Age of Onset   Heart failure Mother    Anxiety disorder Mother    Arthritis Mother    Heart disease Mother    Hypertension Mother    Stroke Mother    Varicose Veins Mother    Depression Mother    Prostate cancer Father    Alcohol abuse Father    Arthritis Father    Cancer Father    COPD Father    Depression Father    Hypertension Father    Obesity Father    Anxiety disorder Sister    Obesity Sister    Hypertension Sister    Alcohol abuse Brother    Anxiety disorder Brother    Arthritis Brother    Drug abuse Brother    Hypertension Brother    Alcohol abuse Brother    Anxiety disorder Brother    Drug abuse Brother    Early death Brother    Heart disease Brother    Hypertension Brother    Stroke Brother    Obesity Brother    Depression Brother    Colon cancer Neg Hx    Diabetes Neg Hx    Kidney disease Neg Hx    Liver disease Neg Hx    Neuropathy Neg Hx    Breast cancer Neg Hx     Current Medications[1]   Allergies[2]   Review of Systems  Constitutional: Negative.   Respiratory: Negative.    Genitourinary:  Positive for pelvic pain.  Musculoskeletal:  Positive for back pain.  Psychiatric/Behavioral: Negative.       Today's Vitals   01/28/24 1100  BP: 120/64  Pulse: 87  Temp: 98.2 F (36.8 C)  TempSrc: Oral  PainSc: 8    There is no height or weight on file to calculate BMI.  Wt Readings from Last 3 Encounters:  10/22/23 236 lb (107 kg)  09/30/23 243 lb (110.2 kg)   09/17/23 243 lb (110.2 kg)    The 10-year ASCVD risk score (Arnett DK, et al., 2019) is: 2.3%   Values used to calculate the score:     Age: 83 years     Clinically relevant sex: Female     Is Non-Hispanic African American: Yes     Diabetic: No     Tobacco smoker: No     Systolic Blood Pressure: 112 mmHg     Is BP treated: No  HDL Cholesterol: 67 mg/dL     Total Cholesterol: 161 mg/dL  Objective:  Physical Exam Constitutional:      Appearance: Normal appearance.  Cardiovascular:     Rate and Rhythm: Normal rate and regular rhythm.     Pulses: Normal pulses.     Heart sounds: Normal heart sounds.  Pulmonary:     Effort: Pulmonary effort is normal.     Breath sounds: Normal breath sounds.  Abdominal:     General: Bowel sounds are normal.  Neurological:     Mental Status: She is alert.         Assessment And Plan:   Assessment & Plan Dysuria Urinalysis shows trace leukocytes, no nitrites. Urine culture ordered. - Ordered urine culture. - Will prescribe antibiotics if culture is positive. Spotting  Leukocytes in urine Urine culture ordered Lumbar radiculopathy Chronic condition with increased pain, possibly due to seasonal changes. History of back surgery in 2012. On multiple nerve pain medications. - Administered steroid injectio Class 3 severe obesity due to excess calories with serious comorbidity and body mass index (BMI) of 40.0 to 44.9 in adult [facility]) She is encouraged to strive for BMI less than 30 to decrease cardiac risk. Advised to aim for at least 150 minutes of exercise per week.   Orders Placed This Encounter  Procedures   Culture, Urine   POCT URINALYSIS DIP (CLINITEK)      Return if symptoms worsen or fail to improve.  Patient was given opportunity to ask questions. Patient verbalized understanding of the plan and was able to repeat key elements of the plan. All questions were answered to their satisfaction.    I, Natalie Creighton, NP, have  reviewed all documentation for this visit. The documentation on 02/04/2024 for the exam, diagnosis, procedures, and orders are all accurate and complete.   IF YOU HAVE BEEN REFERRED TO A SPECIALIST, IT MAY TAKE 1-2 WEEKS TO SCHEDULE/PROCESS THE REFERRAL. IF YOU HAVE NOT HEARD FROM US /SPECIALIST IN TWO WEEKS, PLEASE GIVE US  A CALL AT [phone].      [1]  Current Outpatient Medications:    b complex vitamins capsule, Take 1 capsule by mouth daily., Disp: , Rfl:    brimonidine (ALPHAGAN) 0.2 % ophthalmic solution, Place 1 drop into the right eye 2 (two) times daily., Disp: , Rfl:    buPROPion  (WELLBUTRIN  XL) 300 MG 24 hr tablet, TAKE 1 TABLET (300 MG TOTAL) BY MOUTH EVERY MORNING., Disp: 90 tablet, Rfl: 1   cetirizine (ZYRTEC) 10 MG tablet, Take 10 mg by mouth daily., Disp: , Rfl:    Cholecalciferol (VITAMIN D3) 50 MCG (2000 UT) capsule, Take 2,000 Units by mouth daily., Disp: , Rfl:    Collagen Hydrolysate POWD, by Does not apply route., Disp: , Rfl:    cyclobenzaprine  (FLEXERIL ) 10 MG tablet, Take 1 tablet (10 mg total) by mouth 3 (three) times daily as needed for muscle spasms., Disp: 30 tablet, Rfl: 0   docusate sodium (COLACE) 100 MG capsule, Take 200 mg by mouth as needed for moderate constipation., Disp: , Rfl:    gabapentin  (NEURONTIN ) 300 MG capsule, Take 300 mg by mouth 3 (three) times daily., Disp: , Rfl:    guaiFENesin (MUCINEX) 600 MG 12 hr tablet, Take by mouth 2 (two) times daily., Disp: , Rfl:    ibuprofen  (ADVIL ) 800 MG tablet, Take 800 mg by mouth every 8 (eight) hours as needed., Disp: , Rfl:    imipramine (TOFRANIL) 50 MG tablet, 50 mg 2 (  two) times daily., Disp: , Rfl:    levothyroxine  (SYNTHROID ) 100 MCG tablet, TAKE 1 TABLET MON-FRI AND TAKE 1/2 PILL ON SATURDAY/SUNDAY., Disp: 78 tablet, Rfl: 1   Magnesium  200 MG TABS, Take 4 tablets by mouth daily., Disp: , Rfl:    montelukast  (SINGULAIR ) 10 MG tablet, TAKE 1 TABLET BY MOUTH EVERY DAY, Disp: 90 tablet, Rfl: 1    naloxone (NARCAN) nasal spray 4 mg/0.1 mL, See admin instructions., Disp: , Rfl:    NOREL AD 4-10-325 MG TABS, TAKE 1 TABLET BY MOUTH TWICE A DAY AS NEEDED, Disp: 20 tablet, Rfl: 0   omeprazole  (PRILOSEC) 40 MG capsule, TAKE 1 CAPSULE BY MOUTH EVERY DAY, Disp: 90 capsule, Rfl: 1   OVER THE COUNTER MEDICATION, Tea made with ginger root and tumeric root., Disp: , Rfl:    Oxycodone  HCl 20 MG TABS, SMARTSIG:0.5-1 Tablet(s) By Mouth 4-6 Times Daily, Disp: , Rfl:    RESTASIS 0.05 % ophthalmic emulsion, Place 1 drop into both eyes as needed. Dry eyes, Disp: , Rfl:    Sod Picosulfate-Mag Ox-Cit Acd (CLENPIQ) 10-3.5-12 MG-GM -GM/175ML SOLN, 175 ML ORALLY TWICE for 2, Disp: , Rfl:    topiramate (TOPAMAX) 100 MG tablet, Take 100 mg by mouth 4 (four) times daily., Disp: , Rfl:    Zinc Citrate (ZINC GUMMY PO), Take by mouth daily., Disp: , Rfl:    ciprofloxacin  (CIPRO ) 500 MG tablet, Take 1 tablet (500 mg total) by mouth 2 (two) times daily for 5 days., Disp: 10 tablet, Rfl: 0 [2]  Allergies Allergen Reactions   Codeine Itching

## 2024-02-01 ENCOUNTER — Telehealth: Payer: Self-pay

## 2024-02-01 ENCOUNTER — Encounter: Payer: Self-pay | Admitting: Family Medicine

## 2024-02-01 ENCOUNTER — Ambulatory Visit: Payer: Self-pay | Admitting: Family Medicine

## 2024-02-01 DIAGNOSIS — R3989 Other symptoms and signs involving the genitourinary system: Secondary | ICD-10-CM | POA: Diagnosis not present

## 2024-02-01 DIAGNOSIS — N898 Other specified noninflammatory disorders of vagina: Secondary | ICD-10-CM | POA: Diagnosis not present

## 2024-02-01 DIAGNOSIS — R1032 Left lower quadrant pain: Secondary | ICD-10-CM | POA: Diagnosis not present

## 2024-02-01 DIAGNOSIS — N958 Other specified menopausal and perimenopausal disorders: Secondary | ICD-10-CM | POA: Diagnosis not present

## 2024-02-01 LAB — URINE CULTURE

## 2024-02-01 MED ORDER — CIPROFLOXACIN HCL 500 MG PO TABS: 500.0000 mg | ORAL_TABLET | Freq: Two times a day (BID) | ORAL | 0 refills | Status: AC

## 2024-02-01 NOTE — Telephone Encounter (Signed)
 Called patient and made her aware of UTI results. Patient aware patient stated she went to the GYN this morning and they are treating her for UTI.

## 2024-02-01 NOTE — Progress Notes (Signed)
 Your urine is positive for bacteria. I have sent ciprofloxacin  500mg  twice daily for 5 days to the pharmacy.  Thanks!

## 2024-02-05 NOTE — Assessment & Plan Note (Signed)
 She is encouraged to strive for BMI less than 30 to decrease cardiac risk. Advised to aim for at least 150 minutes of exercise per week.

## 2024-02-05 NOTE — Assessment & Plan Note (Addendum)
 Urinalysis shows trace leukocytes, no nitrites. Urine culture ordered. - Ordered urine culture. - Will prescribe antibiotics if culture is positive.

## 2024-02-05 NOTE — Assessment & Plan Note (Addendum)
 Urine culture ordered

## 2024-02-05 NOTE — Assessment & Plan Note (Addendum)
 Chronic condition with increased pain, possibly due to seasonal changes. History of back surgery in 2012. On multiple nerve pain medications. - Administered steroid injectio

## 2024-02-08 DIAGNOSIS — M5115 Intervertebral disc disorders with radiculopathy, thoracolumbar region: Secondary | ICD-10-CM | POA: Diagnosis not present

## 2024-02-08 DIAGNOSIS — M62830 Muscle spasm of back: Secondary | ICD-10-CM | POA: Diagnosis not present

## 2024-02-08 DIAGNOSIS — R202 Paresthesia of skin: Secondary | ICD-10-CM | POA: Diagnosis not present

## 2024-02-08 DIAGNOSIS — Z79891 Long term (current) use of opiate analgesic: Secondary | ICD-10-CM | POA: Diagnosis not present

## 2024-02-08 DIAGNOSIS — M9904 Segmental and somatic dysfunction of sacral region: Secondary | ICD-10-CM | POA: Diagnosis not present

## 2024-02-08 DIAGNOSIS — M25519 Pain in unspecified shoulder: Secondary | ICD-10-CM | POA: Diagnosis not present

## 2024-02-08 DIAGNOSIS — G894 Chronic pain syndrome: Secondary | ICD-10-CM | POA: Diagnosis not present

## 2024-02-08 DIAGNOSIS — M5430 Sciatica, unspecified side: Secondary | ICD-10-CM | POA: Diagnosis not present

## 2024-02-08 DIAGNOSIS — M179 Osteoarthritis of knee, unspecified: Secondary | ICD-10-CM | POA: Diagnosis not present

## 2024-02-15 ENCOUNTER — Other Ambulatory Visit: Payer: Self-pay | Admitting: Internal Medicine

## 2024-02-16 ENCOUNTER — Encounter: Payer: Self-pay | Admitting: Internal Medicine

## 2024-07-06 ENCOUNTER — Ambulatory Visit: Payer: Self-pay | Admitting: Internal Medicine

## 2024-07-06 ENCOUNTER — Ambulatory Visit: Payer: Self-pay

## 2024-09-14 ENCOUNTER — Encounter: Payer: Self-pay | Admitting: Internal Medicine
